# Patient Record
Sex: Female | Born: 1990 | Hispanic: No | Marital: Single | Smoking: Former smoker
Health system: Southern US, Community
[De-identification: ages and names within clinical notes are randomized; demographics above are authoritative.]

## PROBLEM LIST (undated history)

## (undated) ENCOUNTER — Inpatient Hospital Stay (HOSPITAL_COMMUNITY): Payer: Self-pay

## (undated) DIAGNOSIS — F53 Postpartum depression: Secondary | ICD-10-CM

## (undated) DIAGNOSIS — Z789 Other specified health status: Secondary | ICD-10-CM

## (undated) HISTORY — DX: Other specified health status: Z78.9

---

## 2016-08-15 HISTORY — DX: Maternal care for unspecified type scar from previous cesarean delivery: O34.219

## 2017-01-24 ENCOUNTER — Encounter (HOSPITAL_COMMUNITY): Payer: Self-pay

## 2017-01-24 ENCOUNTER — Emergency Department (HOSPITAL_COMMUNITY)
Admission: EM | Admit: 2017-01-24 | Discharge: 2017-01-24 | Disposition: A | Discharge status: Home / Self Care | Payer: Self-pay | Attending: Emergency Medicine | Admitting: Emergency Medicine

## 2017-01-24 DIAGNOSIS — M5431 Sciatica, right side: Secondary | ICD-10-CM | POA: Insufficient documentation

## 2017-01-24 DIAGNOSIS — Y990 Civilian activity done for income or pay: Secondary | ICD-10-CM | POA: Insufficient documentation

## 2017-01-24 DIAGNOSIS — X500XXA Overexertion from strenuous movement or load, initial encounter: Secondary | ICD-10-CM | POA: Insufficient documentation

## 2017-01-24 DIAGNOSIS — Y929 Unspecified place or not applicable: Secondary | ICD-10-CM | POA: Insufficient documentation

## 2017-01-24 DIAGNOSIS — Y9389 Activity, other specified: Secondary | ICD-10-CM | POA: Insufficient documentation

## 2017-01-24 LAB — URINALYSIS, ROUTINE W REFLEX MICROSCOPIC
Bilirubin Urine: NEGATIVE
Glucose, UA: NEGATIVE mg/dL
Hgb urine dipstick: NEGATIVE
Ketones, ur: NEGATIVE mg/dL
LEUKOCYTES UA: NEGATIVE
NITRITE: NEGATIVE
PH: 7 (ref 5.0–8.0)
Protein, ur: NEGATIVE mg/dL
SPECIFIC GRAVITY, URINE: 1.016 (ref 1.005–1.030)

## 2017-01-24 LAB — PREGNANCY, URINE: PREG TEST UR: NEGATIVE

## 2017-01-24 MED ORDER — PREDNISONE 20 MG PO TABS: ORAL_TABLET | ORAL | 0 refills | Status: DC

## 2017-01-24 MED ORDER — METHOCARBAMOL 500 MG PO TABS: 1000.0000 mg | ORAL_TABLET | Freq: Three times a day (TID) | ORAL | 0 refills | Status: DC

## 2017-01-24 NOTE — ED Notes (Signed)
Pt ambulated to room from waiting area, pt refused wheelchair. Pt ambulated with a steady gait and had no complaints while ambulating.

## 2017-01-24 NOTE — ED Provider Notes (Signed)
[facility]PT Provider Note   CSN: [id number] Arrival date & time: 01/24/17  4098     History   Chief Complaint Chief Complaint  Patient presents with  . Flank Pain    HPI Erin Benjamin is a 26 y.o. female.  HPI Pain started 3 days ago. It started at work. Patient does repetitive lifting and bending. Course pain is in her very lower back slightly to the right. It is worse with bending motions. It radiates to her buttock. It becomes severe with certain positions. No associated pain or burning with urination. No vaginal discharge. No abdominal pain. No weakness or numbness in the leg. She has been trying ibuprofen without much relief. History reviewed. No pertinent past medical history.  There are no active problems to display for this patient.   Past Surgical History:  Procedure Laterality Date  . CESAREAN SECTION  08/2014    OB History    No data available       Home Medications    Prior to Admission medications   Medication Sig Start Date End Date Taking? Authorizing Provider  methocarbamol (ROBAXIN) 500 MG tablet Take 2 tablets (1,000 mg total) by mouth 3 (three) times daily. 01/24/17   Arby Barrette, MD  predniSONE (DELTASONE) 20 MG tablet 3 tabs po daily x 3 days, then 2 tabs x 3 days, then 1.5 tabs x 3 days, then 1 tab x 3 days, then 0.5 tabs x 3 days 01/24/17   Arby Barrette, MD    Family History No family history on file.  Social History Social History  Substance Use Topics  . Smoking status: Current Every Day Smoker    Packs/day: 1.00    Types: Cigarettes  . Smokeless tobacco: Never Used  . Alcohol use Yes     Allergies   Patient has no known allergies.   Review of Systems Review of Systems 10 Systems reviewed and are negative for acute change except as noted in the HPI.  Physical Exam Updated Vital Signs BP 118/83 (BP Location: Right Arm)   Pulse 87   Temp 98 F (36.7 C) (Oral)   Resp 16   Ht 5\' 8"  (1.727 m)   Wt 86.2 kg (190  lb)   LMP 12/28/2016   SpO2 100%   BMI 28.89 kg/m   Physical Exam  Constitutional: She is oriented to person, place, and time. She appears well-developed and well-nourished. No distress.  HENT:  Head: Normocephalic and atraumatic.  Eyes: Conjunctivae are normal.  Neck: Neck supple.  Cardiovascular: Normal rate, regular rhythm, normal heart sounds and intact distal pulses.   No murmur heard. Pulmonary/Chest: Effort normal and breath sounds normal. No respiratory distress.  Abdominal: Soft. Bowel sounds are normal. She exhibits no distension. There is no tenderness. There is no guarding.  Musculoskeletal: She exhibits no edema.  Positive straight leg raise 50 on the right. Severe pain to the sciatic region on the right buttock. Minimally reproducible pain to palpation over the SI joint on the right. Pain is produced by patient transitioning from seated position to standing position. Patient can ambulate with steady gait and no weakness.  Neurological: She is alert and oriented to person, place, and time. No cranial nerve deficit. She exhibits normal muscle tone. Coordination normal.  Skin: Skin is warm and dry.  Psychiatric: She has a normal mood and affect.  Nursing note and vitals reviewed.    ED Treatments / Results  Labs (all labs ordered are listed, but only abnormal results  are displayed) Labs Reviewed  URINALYSIS, ROUTINE W REFLEX MICROSCOPIC  PREGNANCY, URINE    EKG  EKG Interpretation None       Radiology No results found.  Procedures Procedures (including critical care time)  Medications Ordered in ED Medications - No data to display   Initial Impression / Assessment and Plan / ED Course  I have reviewed the triage vital signs and the nursing notes.  Pertinent labs & imaging results that were available during my care of the patient were reviewed by me and considered in my medical decision making (see chart for details).      Final Clinical  Impressions(s) / ED Diagnoses   Final diagnoses:  Sciatica of right side   Patient has pain that localizes in her lower back and around the SI joint. Not significantly reproducible to palpation over the muscular bodies or the bony prominences. It is very positionally reproduced with straight leg raise and forward flexion. Patient does not have associated weakness or numbness. She does repetitive work with stooping and twisting. Plan will be to initiate a 2 week course of prednisone with muscle relaxers. Temporary work restrictions. Patient is consulted on the necessity of follow-up for ongoing management. New Prescriptions New Prescriptions   METHOCARBAMOL (ROBAXIN) 500 MG TABLET    Take 2 tablets (1,000 mg total) by mouth 3 (three) times daily.   PREDNISONE (DELTASONE) 20 MG TABLET    3 tabs po daily x 3 days, then 2 tabs x 3 days, then 1.5 tabs x 3 days, then 1 tab x 3 days, then 0.5 tabs x 3 days     Arby BarrettePfeiffer, Beola Vasallo, MD 01/24/17 1002

## 2017-01-24 NOTE — ED Triage Notes (Signed)
Per Pt, Pt reports lower back pain that started three days ago that now is trailing around hips bilaterally. Pt reports urinary frequency. Denies any N/V or fevers.

## 2017-02-13 ENCOUNTER — Encounter (HOSPITAL_COMMUNITY): Payer: Self-pay | Admitting: Emergency Medicine

## 2017-02-13 ENCOUNTER — Emergency Department (HOSPITAL_COMMUNITY)
Admission: EM | Admit: 2017-02-13 | Discharge: 2017-02-14 | Disposition: A | Discharge status: Home / Self Care | Payer: Self-pay | Attending: Emergency Medicine | Admitting: Emergency Medicine

## 2017-02-13 DIAGNOSIS — M791 Myalgia: Secondary | ICD-10-CM | POA: Insufficient documentation

## 2017-02-13 DIAGNOSIS — Z5321 Procedure and treatment not carried out due to patient leaving prior to being seen by health care provider: Secondary | ICD-10-CM | POA: Insufficient documentation

## 2017-02-13 DIAGNOSIS — R110 Nausea: Secondary | ICD-10-CM | POA: Insufficient documentation

## 2017-02-13 MED ORDER — ACETAMINOPHEN 325 MG PO TABS
650.0000 mg | ORAL_TABLET | Freq: Once | ORAL | Status: AC | PRN
  Administered 2017-02-13: 650 mg via ORAL
  Filled 2017-02-13: qty 2

## 2017-02-13 NOTE — ED Notes (Signed)
Pt called to be taken to treatment room with no answer  

## 2017-02-13 NOTE — ED Triage Notes (Addendum)
Pt from home with c/o fever and generalized aches x 4 days. Pt states she has been dry heaving today. Per EMS, pt has a fever of 103.3. Pt states she took advil for this.

## 2017-02-15 ENCOUNTER — Emergency Department (HOSPITAL_COMMUNITY)
Admission: EM | Admit: 2017-02-15 | Discharge: 2017-02-15 | Disposition: A | Discharge status: Home / Self Care | Payer: Self-pay | Attending: Emergency Medicine | Admitting: Emergency Medicine

## 2017-02-15 ENCOUNTER — Encounter (HOSPITAL_COMMUNITY): Payer: Self-pay

## 2017-02-15 DIAGNOSIS — N12 Tubulo-interstitial nephritis, not specified as acute or chronic: Secondary | ICD-10-CM | POA: Insufficient documentation

## 2017-02-15 DIAGNOSIS — E876 Hypokalemia: Secondary | ICD-10-CM | POA: Insufficient documentation

## 2017-02-15 DIAGNOSIS — F17210 Nicotine dependence, cigarettes, uncomplicated: Secondary | ICD-10-CM | POA: Insufficient documentation

## 2017-02-15 DIAGNOSIS — N10 Acute tubulo-interstitial nephritis: Secondary | ICD-10-CM

## 2017-02-15 LAB — CBC WITH DIFFERENTIAL/PLATELET
Basophils Absolute: 0 10*3/uL (ref 0.0–0.1)
Basophils Relative: 0 %
EOS PCT: 0 %
Eosinophils Absolute: 0 10*3/uL (ref 0.0–0.7)
HCT: 39.1 % (ref 36.0–46.0)
Hemoglobin: 12.8 g/dL (ref 12.0–15.0)
LYMPHS ABS: 1.2 10*3/uL (ref 0.7–4.0)
LYMPHS PCT: 8 %
MCH: 28.3 pg (ref 26.0–34.0)
MCHC: 32.7 g/dL (ref 30.0–36.0)
MCV: 86.3 fL (ref 78.0–100.0)
MONO ABS: 1.6 10*3/uL — AB (ref 0.1–1.0)
Monocytes Relative: 11 %
Neutro Abs: 11.4 10*3/uL — ABNORMAL HIGH (ref 1.7–7.7)
Neutrophils Relative %: 81 %
PLATELETS: 173 10*3/uL (ref 150–400)
RBC: 4.53 MIL/uL (ref 3.87–5.11)
RDW: 15.8 % — AB (ref 11.5–15.5)
WBC: 14.1 10*3/uL — ABNORMAL HIGH (ref 4.0–10.5)

## 2017-02-15 LAB — URINALYSIS, ROUTINE W REFLEX MICROSCOPIC
Bilirubin Urine: NEGATIVE
Glucose, UA: NEGATIVE mg/dL
KETONES UR: 20 mg/dL — AB
NITRITE: NEGATIVE
PROTEIN: 30 mg/dL — AB
Specific Gravity, Urine: 1.004 — ABNORMAL LOW (ref 1.005–1.030)
Squamous Epithelial / LPF: NONE SEEN
pH: 6 (ref 5.0–8.0)

## 2017-02-15 LAB — I-STAT BETA HCG BLOOD, ED (MC, WL, AP ONLY): I-stat hCG, quantitative: <5 m[IU]/mL (ref <5)

## 2017-02-15 LAB — I-STAT CHEM 8, ED
BUN: 5 mg/dL — ABNORMAL LOW (ref 6–20)
CALCIUM ION: 1.13 mmol/L — AB (ref 1.15–1.40)
CHLORIDE: 97 mmol/L — AB (ref 101–111)
Creatinine, Ser: 0.7 mg/dL (ref 0.44–1.00)
Glucose, Bld: 134 mg/dL — ABNORMAL HIGH (ref 65–99)
HCT: 49 % — ABNORMAL HIGH (ref 36.0–46.0)
HEMOGLOBIN: 16.7 g/dL — AB (ref 12.0–15.0)
Potassium: 2.9 mmol/L — ABNORMAL LOW (ref 3.5–5.1)
SODIUM: 135 mmol/L (ref 135–145)
TCO2: 23 mmol/L (ref 0–100)

## 2017-02-15 LAB — I-STAT CG4 LACTIC ACID, ED: Lactic Acid, Venous: 0.74 mmol/L (ref 0.5–1.9)

## 2017-02-15 MED ORDER — SODIUM CHLORIDE 0.9 % IV BOLUS (SEPSIS)
1000.0000 mL | Freq: Once | INTRAVENOUS | Status: AC
  Administered 2017-02-15: 1000 mL via INTRAVENOUS

## 2017-02-15 MED ORDER — METOCLOPRAMIDE HCL 5 MG/ML IJ SOLN
10.0000 mg | Freq: Once | INTRAMUSCULAR | Status: AC
  Administered 2017-02-15: 10 mg via INTRAVENOUS
  Filled 2017-02-15: qty 2

## 2017-02-15 MED ORDER — POTASSIUM CHLORIDE CRYS ER 20 MEQ PO TBCR
40.0000 meq | EXTENDED_RELEASE_TABLET | Freq: Once | ORAL | Status: AC
  Administered 2017-02-15: 40 meq via ORAL
  Filled 2017-02-15: qty 2

## 2017-02-15 MED ORDER — METOCLOPRAMIDE HCL 10 MG PO TABS: 10.0000 mg | ORAL_TABLET | Freq: Four times a day (QID) | ORAL | 0 refills | Status: DC | PRN

## 2017-02-15 MED ORDER — DEXTROSE 5 % IV SOLN
1.0000 g | Freq: Once | INTRAVENOUS | Status: AC
  Administered 2017-02-15: 1 g via INTRAVENOUS
  Filled 2017-02-15: qty 10

## 2017-02-15 MED ORDER — ACETAMINOPHEN 500 MG PO TABS
1000.0000 mg | ORAL_TABLET | Freq: Once | ORAL | Status: AC
  Administered 2017-02-15: 1000 mg via ORAL
  Filled 2017-02-15: qty 2

## 2017-02-15 MED ORDER — CEPHALEXIN 500 MG PO CAPS: 500.0000 mg | ORAL_CAPSULE | Freq: Four times a day (QID) | ORAL | 0 refills | Status: DC

## 2017-02-15 MED ORDER — ACETAMINOPHEN 325 MG PO TABS: 325.0000 mg | ORAL_TABLET | Freq: Once | ORAL | Status: DC

## 2017-02-15 NOTE — ED Provider Notes (Signed)
[facility] Provider Note   CSN: [id number] Arrival date & time: 02/15/17  0753     History   Chief Complaint Chief Complaint  Patient presents with  . Headache  . Emesis    HPI Erin Benjamin is a 26 y.o. female.  HPI complains of throbbing headache at the top of head and occiput gradual onset 3 days ago. She is treated himself with Advil, without relief. She reports temperature 103 degrees [facility] 2 days ago 3 days ago, when she complained of right flank pain. She missed of vomiting 2 times this morning she gets similar headaches approximately twice per month however not lasting as long. She denies any neck pain or stiffness. She was prescribed prednisone and Robaxin which she's taken without relief. She also treated herself with Advil last dose at midnight today. No other associated symptoms  History reviewed. No pertinent past medical history.  There are no active problems to display for this patient.   Past Surgical History:  Procedure Laterality Date  . CESAREAN SECTION  08/2014    OB History    Gravida Para Term Preterm AB Living   1 1           SAB TAB Ectopic Multiple Live Births                   Home Medications    Prior to Admission medications   Medication Sig Start Date End Date Taking? Authorizing Provider  methocarbamol (ROBAXIN) 500 MG tablet Take 2 tablets (1,000 mg total) by mouth 3 (three) times daily. 01/24/17   Arby BarrettePfeiffer, Marcy, MD  predniSONE (DELTASONE) 20 MG tablet 3 tabs po daily x 3 days, then 2 tabs x 3 days, then 1.5 tabs x 3 days, then 1 tab x 3 days, then 0.5 tabs x 3 days 01/24/17   Arby BarrettePfeiffer, Marcy, MD    Family History No family history on file.  Social History Social History  Substance Use Topics  . Smoking status: Current Every Day Smoker    Packs/day: 1.00    Types: Cigarettes  . Smokeless tobacco: Never Used  . Alcohol use Yes     Allergies   Patient has no known allergies.   Review of  Systems Review of Systems  Constitutional: Positive for fever.  HENT: Negative.   Respiratory: Negative.   Cardiovascular: Negative.   Gastrointestinal: Positive for nausea and vomiting.  Genitourinary: Positive for flank pain.  Skin: Negative.   Neurological: Negative.   Psychiatric/Behavioral: Negative.   All other systems reviewed and are negative.    Physical Exam Updated Vital Signs BP 121/83 (BP Location: Right Arm)   Pulse (!) 111   Temp 99.1 F (37.3 C) (Oral)   Resp 18   Ht 5\' 8"  (1.727 m)   Wt 85.3 kg (188 lb)   LMP 01/28/2017   SpO2 98%   BMI 28.59 kg/m   Physical Exam  Constitutional: She is oriented to person, place, and time. She appears well-developed and well-nourished. No distress.  HENT:  Head: Normocephalic and atraumatic.  Eyes: Conjunctivae are normal. Pupils are equal, round, and reactive to light.  Fundi benign  Neck: Neck supple. No tracheal deviation present. No thyromegaly present.  Cardiovascular: Normal rate, regular rhythm and normal heart sounds.   No murmur heard. Mildly tachycardic  Pulmonary/Chest: Effort normal and breath sounds normal.  Abdominal: Soft. Bowel sounds are normal. She exhibits no distension. There is no tenderness.  Genitourinary:  Genitourinary  Comments: Right flank tenderness  Musculoskeletal: Normal range of motion. She exhibits no edema or tenderness.  Neurological: She is alert and oriented to person, place, and time. Coordination normal.  Gait normal Romberg normal pronator drift normal finger to nose normal DTR symmetric bilaterally at knee jerk ankle jerk and biceps as ordered bilaterally  Skin: Skin is warm and dry. Capillary refill takes less than 2 seconds. No rash noted.  Psychiatric: She has a normal mood and affect.  Nursing note and vitals reviewed.    ED Treatments / Results  Labs (all labs ordered are listed, but only abnormal results are displayed) Labs Reviewed  COMPREHENSIVE METABOLIC PANEL    CBC WITH DIFFERENTIAL/PLATELET  URINALYSIS, ROUTINE W REFLEX MICROSCOPIC  I-STAT CG4 LACTIC ACID, ED  I-STAT BETA HCG BLOOD, ED ([facility], [facility], [facility] ONLY)    EKG  EKG Interpretation None      Results for orders placed or performed during the hospital encounter of 02/15/17  CBC with Differential  Result Value Ref Range   WBC 14.1 (H) 4.0 - 10.5 K/uL   RBC 4.53 3.87 - 5.11 MIL/uL   Hemoglobin 12.8 12.0 - 15.0 g/dL   HCT 16.1 09.6 - 04.5 %   MCV 86.3 78.0 - 100.0 fL   MCH 28.3 26.0 - 34.0 pg   MCHC 32.7 30.0 - 36.0 g/dL   RDW 40.9 (H) 81.1 - 91.4 %   Platelets 173 150 - 400 K/uL   Neutrophils Relative % 81 %   Neutro Abs 11.4 (H) 1.7 - 7.7 K/uL   Lymphocytes Relative 8 %   Lymphs Abs 1.2 0.7 - 4.0 K/uL   Monocytes Relative 11 %   Monocytes Absolute 1.6 (H) 0.1 - 1.0 K/uL   Eosinophils Relative 0 %   Eosinophils Absolute 0.0 0.0 - 0.7 K/uL   Basophils Relative 0 %   Basophils Absolute 0.0 0.0 - 0.1 K/uL  Urinalysis, Routine w reflex microscopic  Result Value Ref Range   Color, Urine YELLOW YELLOW   APPearance CLOUDY (A) CLEAR   Specific Gravity, Urine 1.004 (L) 1.005 - 1.030   pH 6.0 5.0 - 8.0   Glucose, UA NEGATIVE NEGATIVE mg/dL   Hgb urine dipstick MODERATE (A) NEGATIVE   Bilirubin Urine NEGATIVE NEGATIVE   Ketones, ur 20 (A) NEGATIVE mg/dL   Protein, ur 30 (A) NEGATIVE mg/dL   Nitrite NEGATIVE NEGATIVE   Leukocytes, UA LARGE (A) NEGATIVE   RBC / HPF 0-5 0 - 5 RBC/hpf   WBC, UA TOO NUMEROUS TO COUNT 0 - 5 WBC/hpf   Bacteria, UA MANY (A) NONE SEEN   Squamous Epithelial / LPF NONE SEEN NONE SEEN   WBC Clumps PRESENT   I-Stat CG4 Lactic Acid, ED  Result Value Ref Range   Lactic Acid, Venous 0.74 0.5 - 1.9 mmol/L  I-Stat beta hCG blood, ED  Result Value Ref Range   I-stat hCG, quantitative <5.0 <5 mIU/mL   Comment 3          I-stat chem 8, ed  Result Value Ref Range   Sodium 135 135 - 145 mmol/L   Potassium 2.9 (L) 3.5 - 5.1 mmol/L   Chloride 97 (L) 101 - 111 mmol/L    BUN 5 (L) 6 - 20 mg/dL   Creatinine, Ser 7.82 0.44 - 1.00 mg/dL   Glucose, Bld 956 (H) 65 - 99 mg/dL   Calcium, Ion 2.13 (L) 1.15 - 1.40 mmol/L   TCO2 23 0 - 100 mmol/L   Hemoglobin  16.7 (H) 12.0 - 15.0 g/dL   HCT 16.1 (H) 09.6 - 04.5 %   No results found. Radiology No results found.  Procedures Procedures (including critical care time)  Medications Ordered in ED Medications - No data to display   Initial Impression / Assessment and Plan / ED Course  I have reviewed the triage vital signs and the nursing notes.  Pertinent labs & imaging results that were available during my care of the patient were reviewed by me and considered in my medical decision making (see chart for details).     11:50 AM patient feels much improved after treatment with intravenous fluids, oral potassium, IV antibiotics, IV Reglan and Tylenol. She is no longer nauseated. She is able to drink without vomiting. She feels ready to go home Prescription Keflex., Reglan Referral primary care. Urine sent for culture. Final Clinical Impressions(s) / ED Diagnoses  Diagnosis #1 acute pyelonephritis #2 nausea and vomiting #3 headache #4 hypokalemia Final diagnoses:  None    New Prescriptions New Prescriptions   No medications on file     Doug Sou, MD 02/15/17 1200

## 2017-02-15 NOTE — ED Triage Notes (Signed)
Pt arrives POV with c/o headache and chill since Monday . [facility] for same. Alert and oriented x 4 MAEW.

## 2017-02-15 NOTE — ED Notes (Signed)
Dr. Ethelda ChickJacubowitz states to discontinue lab and chest xray.

## 2017-02-15 NOTE — Discharge Instructions (Signed)
Make sure that you finish the antibiotic (Keflex or cephalexin) as prescribed. Take the medication prescribed as needed for nausea or headache. Take Tylenol every 4 hours for aches or for temperature higher than 100.4 while awake. Return if you continue to vomit Or do not feel improved after 2 or 3 days or you can return sooner if concern for any reason. You can call any of the numbers on these instructions to get a primary care physician

## 2017-02-15 NOTE — ED Notes (Signed)
ED Provider at bedside. 

## 2017-02-17 LAB — URINE CULTURE
Culture: 50000 — AB
Special Requests: NORMAL

## 2017-02-18 ENCOUNTER — Telehealth: Payer: Self-pay

## 2017-02-18 NOTE — Telephone Encounter (Signed)
Post ED Visit - Positive Culture Follow-up  Culture report reviewed by antimicrobial stewardship pharmacist:  []  Enzo BiNathan Batchelder, Pharm.D. []  Celedonio MiyamotoJeremy Frens, Pharm.D., BCPS AQ-ID []  Garvin FilaMike Maccia, Pharm.D., BCPS []  Georgina PillionElizabeth Martin, Pharm.D., BCPS []  WaldoMinh Pham, VermontPharm.D., BCPS, AAHIVP []  Estella HuskMichelle Turner, Pharm.D., BCPS, AAHIVP []  Lysle Pearlachel Rumbarger, PharmD, BCPS []  Casilda Carlsaylor Stone, PharmD, BCPS []  Pollyann SamplesAndy Johnston, PharmD, BCPS [place] HunAllison Masters Pharm D Positive urine culture Treated with Cephalexin, organism sensitive to the same and no further patient follow-up is required at this time.  Jerry CarasCullom, Brylin Stanislawski Burnett 02/18/2017, 9:43 AM

## 2017-03-27 ENCOUNTER — Inpatient Hospital Stay (HOSPITAL_COMMUNITY)
Admission: AD | Admit: 2017-03-27 | Discharge: 2017-03-27 | Disposition: A | Discharge status: Home / Self Care | Payer: Medicaid Other | Source: Ambulatory Visit | Attending: Obstetrics and Gynecology | Admitting: Obstetrics and Gynecology

## 2017-03-27 ENCOUNTER — Inpatient Hospital Stay (HOSPITAL_COMMUNITY): Payer: Medicaid Other

## 2017-03-27 ENCOUNTER — Encounter (HOSPITAL_COMMUNITY): Payer: Self-pay | Admitting: *Deleted

## 2017-03-27 DIAGNOSIS — O209 Hemorrhage in early pregnancy, unspecified: Secondary | ICD-10-CM | POA: Insufficient documentation

## 2017-03-27 DIAGNOSIS — F17210 Nicotine dependence, cigarettes, uncomplicated: Secondary | ICD-10-CM | POA: Diagnosis not present

## 2017-03-27 DIAGNOSIS — Z3A01 Less than 8 weeks gestation of pregnancy: Secondary | ICD-10-CM | POA: Diagnosis not present

## 2017-03-27 DIAGNOSIS — N939 Abnormal uterine and vaginal bleeding, unspecified: Secondary | ICD-10-CM | POA: Diagnosis present

## 2017-03-27 DIAGNOSIS — R102 Pelvic and perineal pain: Secondary | ICD-10-CM | POA: Insufficient documentation

## 2017-03-27 DIAGNOSIS — O26891 Other specified pregnancy related conditions, first trimester: Secondary | ICD-10-CM | POA: Diagnosis present

## 2017-03-27 DIAGNOSIS — O99331 Smoking (tobacco) complicating pregnancy, first trimester: Secondary | ICD-10-CM | POA: Diagnosis not present

## 2017-03-27 LAB — CBC
HCT: 37.9 % (ref 36.0–46.0)
HEMOGLOBIN: 12.5 g/dL (ref 12.0–15.0)
MCH: 29.2 pg (ref 26.0–34.0)
MCHC: 33 g/dL (ref 30.0–36.0)
MCV: 88.6 fL (ref 78.0–100.0)
Platelets: 291 10*3/uL (ref 150–400)
RBC: 4.28 MIL/uL (ref 3.87–5.11)
RDW: 16.4 % — AB (ref 11.5–15.5)
WBC: 17.1 10*3/uL — ABNORMAL HIGH (ref 4.0–10.5)

## 2017-03-27 LAB — POCT PREGNANCY, URINE: PREG TEST UR: POSITIVE — AB

## 2017-03-27 LAB — URINALYSIS, ROUTINE W REFLEX MICROSCOPIC
BILIRUBIN URINE: NEGATIVE
GLUCOSE, UA: NEGATIVE mg/dL
HGB URINE DIPSTICK: NEGATIVE
KETONES UR: NEGATIVE mg/dL
Leukocytes, UA: NEGATIVE
Nitrite: NEGATIVE
PROTEIN: NEGATIVE mg/dL
Specific Gravity, Urine: 1.018 (ref 1.005–1.030)
pH: 6 (ref 5.0–8.0)

## 2017-03-27 LAB — HCG, QUANTITATIVE, PREGNANCY: HCG, BETA CHAIN, QUANT, S: 73385 m[IU]/mL — AB (ref <5)

## 2017-03-27 LAB — ABO/RH: ABO/RH(D): O POS

## 2017-03-27 NOTE — MAU Note (Signed)
Pt started having lower abd  & back pain @ 0400 this morning, also started bleeding today.  Pos HPT on Friday.  C/O vomiting, no diarrhea or fever.

## 2017-03-27 NOTE — MAU Provider Note (Signed)
History     CSN: [id number]  Arrival date and time: 03/27/17 1638   None     Chief Complaint  Patient presents with  . Vaginal Bleeding   Erin Benjamin is a 26 y.o. G1P1 at Unknown who presents today with vaginal bleeding. She had a +UPT on 03/24/17.   Vaginal Bleeding  The patient's primary symptoms include pelvic pain and vaginal bleeding. The patient's pertinent negatives include no vaginal discharge. This is a new problem. The current episode started today. The problem occurs constantly. The problem has been unchanged. Pain severity now: 10/10. The problem affects both sides. She is pregnant. Associated symptoms include back pain. Pertinent negatives include no chills, dysuria, fever, frequency, nausea, urgency or vomiting. The vaginal bleeding is lighter than menses. She has not been passing clots. She has not been passing tissue. Nothing aggravates the symptoms. She has tried nothing for the symptoms. Her menstrual history has been regular (LMP 01/28/17 ).   No past medical history on file.  Past Surgical History:  Procedure Laterality Date  . CESAREAN SECTION  08/2014    No family history on file.  Social History  Substance Use Topics  . Smoking status: Current Every Day Smoker    Packs/day: 1.00    Types: Cigarettes  . Smokeless tobacco: Never Used  . Alcohol use Yes    Allergies: No Known Allergies  Prescriptions Prior to Admission  Medication Sig Dispense Refill Last Dose  . cephALEXin (KEFLEX) 500 MG capsule Take 1 capsule (500 mg total) by mouth 4 (four) times daily. 1 capsule 3 times daily for 10 days 30 capsule 0   . methocarbamol (ROBAXIN) 500 MG tablet Take 2 tablets (1,000 mg total) by mouth 3 (three) times daily. 30 tablet 0 2-3 weeks  . metoCLOPramide (REGLAN) 10 MG tablet Take 1 tablet (10 mg total) by mouth every 6 (six) hours as needed for nausea (nausea/headache). 10 tablet 0   . predniSONE (DELTASONE) 20 MG tablet 3 tabs po daily x 3 days, then 2  tabs x 3 days, then 1.5 tabs x 3 days, then 1 tab x 3 days, then 0.5 tabs x 3 days (Patient not taking: Reported on 02/15/2017) 27 tablet 0 Completed Course at Unknown time    Review of Systems  Constitutional: Negative for chills and fever.  Gastrointestinal: Negative for nausea and vomiting.  Genitourinary: Positive for pelvic pain and vaginal bleeding. Negative for dysuria, frequency, urgency and vaginal discharge.  Musculoskeletal: Positive for back pain.   Physical Exam   Blood pressure 116/84, pulse 75, temperature 98.5 F (36.9 C), temperature source Oral, resp. rate 18, height 5\' 8"  (1.727 m), weight 192 lb (87.1 kg), last menstrual period 01/28/2017.  Physical Exam  Nursing note and vitals reviewed. Constitutional: She is oriented to person, place, and time. She appears well-developed and well-nourished. No distress.  HENT:  Head: Normocephalic.  Cardiovascular: Normal rate.   Respiratory: Effort normal.  GI: Soft. There is no tenderness. There is no rebound.  Neurological: She is alert and oriented to person, place, and time.  Skin: Skin is warm and dry.  Psychiatric: She has a normal mood and affect.    MAU Course  Procedures  MDM 2000: Care turned over to J. Wenzel at 2000.  CBC/HCG/ABO/RH/US pending Thressa ShellerHeather Hogan 7:43 PM 03/27/17   2000 [facility]. Patient waiting for US.  Patient declines pelvic exam at this time and requests to leave because she has to work early  in the morning. She denies any additional bleeding noted since arrival in MAU  Assessment and Plan  A: [facility]UP at [redacted]w[redacted]d Spotting in pregnancy, first trimester  P:  Discharge home Tylenol PRN for pain Bleeding/first trimester precautions discussed Patient advised to follow-up with OB provider of choice Pregnancy confirmation letter and list of area OB/GYN providers given Patient may return to MAU as needed or if her condition were to change or worsen   Marny Lowenstein,  PA-C 03/27/2017 1:44 AM

## 2017-03-27 NOTE — Discharge Instructions (Signed)
Vaginal Bleeding During Pregnancy, First Trimester °A small amount of bleeding (spotting) from the vagina is common in early pregnancy. Sometimes the bleeding is normal and is not a problem, and sometimes it is a sign of something serious. Be sure to tell your doctor about any bleeding from your vagina right away. °Follow these instructions at home: °· Watch your condition for any changes. °· Follow your doctor's instructions about how active you can be. °· If you are on bed rest: °? You may need to stay in bed and only get up to use the bathroom. °? You may be allowed to do some activities. °? If you need help, make plans for someone to help you. °· Write down: °? The number of pads you use each day. °? How often you change pads. °? How soaked (saturated) your pads are. °· Do not use tampons. °· Do not douche. °· Do not have sex or orgasms until your doctor says it is okay. °· If you pass any tissue from your vagina, save the tissue so you can show it to your doctor. °· Only take medicines as told by your doctor. °· Do not take aspirin because it can make you bleed. °· Keep all follow-up visits as told by your doctor. °Contact a doctor if: °· You bleed from your vagina. °· You have cramps. °· You have labor pains. °· You have a fever that does not go away after you take medicine. °Get help right away if: °· You have very bad cramps in your back or belly (abdomen). °· You pass large clots or tissue from your vagina. °· You bleed more. °· You feel light-headed or weak. °· You pass out (faint). °· You have chills. °· You are leaking fluid or have a gush of fluid from your vagina. °· You pass out while pooping (having a bowel movement). °This information is not intended to replace advice given to you by your health care provider. Make sure you discuss any questions you have with your health care provider. °Document Released: 12/16/2013 Document Revised: 01/07/2016 Document Reviewed: 04/08/2013 °Elsevier Interactive  Patient Education © 2018 [employer] Inc. ° °Pelvic Rest °Pelvic rest may be recommended if: °· Your placenta is partially or completely covering the opening of your cervix (placenta previa). °· There is bleeding between the wall of the uterus and the amniotic sac in the first trimester of pregnancy (subchorionic hemorrhage). °· You went into labor too early (preterm labor). ° °Based on your overall health and the health of your baby, your health care provider will decide if pelvic rest is right for you. °How do I rest my pelvis? °For as long as told by your health care provider: °· Do not have sex, sexual stimulation, or an orgasm. °· Do not use tampons. Do not douche. Do not put anything in your vagina. °· Do not lift anything that is heavier than 10 lb (4.5 kg). °· Avoid activities that take a lot of effort (are strenuous). °· Avoid any activity in which your pelvic muscles could become strained. ° °When should I seek medical care? °Seek medical care if you have: °· Cramping pain in your lower abdomen. °· Vaginal discharge. °· A low, dull backache. °· Regular contractions. °· Uterine tightening. ° °When should I seek immediate medical care? °Seek immediate medical care if: °· You have vaginal bleeding and you are pregnant. ° °This information is not intended to replace advice given to you by your health care provider. Make sure   you discuss any questions you have with your health care provider. °Document Released: 11/26/2010 Document Revised: 01/07/2016 Document Reviewed: 02/02/2015 °Elsevier Interactive Patient Education © 2018 Elsevier Inc. ° °. ° °

## 2017-03-29 LAB — CULTURE, OB URINE

## 2017-05-17 ENCOUNTER — Encounter: Payer: Self-pay | Admitting: Family Medicine

## 2017-05-18 ENCOUNTER — Ambulatory Visit (INDEPENDENT_AMBULATORY_CARE_PROVIDER_SITE_OTHER): Payer: Medicaid Other | Admitting: Obstetrics & Gynecology

## 2017-05-18 ENCOUNTER — Encounter: Payer: Self-pay | Admitting: Obstetrics & Gynecology

## 2017-05-18 ENCOUNTER — Ambulatory Visit: Payer: Self-pay

## 2017-05-18 ENCOUNTER — Other Ambulatory Visit (HOSPITAL_COMMUNITY)
Admission: RE | Admit: 2017-05-18 | Discharge: 2017-05-18 | Disposition: A | Discharge status: Home / Self Care | Payer: Medicaid Other | Source: Ambulatory Visit | Attending: Obstetrics & Gynecology | Admitting: Obstetrics & Gynecology

## 2017-05-18 VITALS — BP 142/79 | HR 89 | Ht 67.0 in | Wt 186.8 lb

## 2017-05-18 DIAGNOSIS — Z124 Encounter for screening for malignant neoplasm of cervix: Secondary | ICD-10-CM | POA: Diagnosis not present

## 2017-05-18 DIAGNOSIS — Z3480 Encounter for supervision of other normal pregnancy, unspecified trimester: Secondary | ICD-10-CM | POA: Insufficient documentation

## 2017-05-18 DIAGNOSIS — Z87891 Personal history of nicotine dependence: Secondary | ICD-10-CM

## 2017-05-18 DIAGNOSIS — Z113 Encounter for screening for infections with a predominantly sexual mode of transmission: Secondary | ICD-10-CM

## 2017-05-18 DIAGNOSIS — Z3482 Encounter for supervision of other normal pregnancy, second trimester: Secondary | ICD-10-CM | POA: Diagnosis not present

## 2017-05-18 DIAGNOSIS — O3680X0 Pregnancy with inconclusive fetal viability, not applicable or unspecified: Secondary | ICD-10-CM

## 2017-05-18 DIAGNOSIS — Z1389 Encounter for screening for other disorder: Secondary | ICD-10-CM | POA: Diagnosis not present

## 2017-05-18 DIAGNOSIS — B9689 Other specified bacterial agents as the cause of diseases classified elsewhere: Secondary | ICD-10-CM | POA: Insufficient documentation

## 2017-05-18 NOTE — Progress Notes (Signed)
Here for new ob today. Reports spotting on 05/15/17 and then bleeding like a period 10/2-10/3/18, then spotting again.  Sent to [place] in our department to check for heartrate. C/o green vaginal discharge last week.  Declines flu shot.  Signed up for [employer].   2:27  Addendum panorama sent at patient request. Pick up #[id number]. Medicaid home form completed.

## 2017-05-18 NOTE — Patient Instructions (Signed)
Vaginal Birth After Cesarean Delivery Vaginal birth after cesarean delivery (VBAC) is giving birth vaginally after previously delivering a baby by a cesarean. In the past, if a woman had a cesarean delivery, all births afterward would be done by cesarean delivery. This is no longer true. It can be safe for the mother to try a vaginal delivery after having a cesarean delivery. It is important to discuss VBAC with your health care provider early in the pregnancy so you can understand the risks, benefits, and options. It will give you time to decide what is best in your particular case. The final decision about whether to have a VBAC or repeat cesarean delivery should be between you and your health care provider. Any changes in your health or your baby's health during your pregnancy may make it necessary to change your initial decision about VBAC. Women who plan to have a VBAC should check with their health care provider to be sure that:  The previous cesarean delivery was done with a low transverse uterine cut (incision) (not a vertical classical incision).  The birth canal is big enough for the baby.  There were no other operations on the uterus.  An electronic fetal monitor (EFM) will be on at all times during labor.  An operating room will be available and ready in case an emergency cesarean delivery is needed.  A health care provider and surgical nursing staff will be available at all times during labor to be ready to do an emergency delivery cesarean if necessary.  An anesthesiologist will be present in case an emergency cesarean delivery is needed.  The nursery is prepared and has adequate personnel and necessary equipment available to care for the baby in case of an emergency cesarean delivery. Benefits of VBAC  Shorter stay in the hospital.  Avoidance of risks associated with cesarean delivery, such as: ? Surgical complications, such as opening of the incision or hernia in the  incision. ? Injury to other organs. ? Fever. This can occur if an infection develops after surgery. It can also occur as a reaction to the medicine given to make you numb during the surgery.  Less blood loss and need for blood transfusions.  Lower risk of blood clots and infection.  Shorter recovery.  Decreased risk for having to remove the uterus (hysterectomy).  Decreased risk for the placenta to completely or partially cover the opening of the uterus (placenta previa) with a future pregnancy.  Decrease risk in future labor and delivery. Risks of a VBAC  Tearing (rupture) of the uterus. This is occurs in less than 1% of VBACs. The risk of this happening is higher if: ? Steps are taken to begin the labor process (induce labor) or stimulate or strengthen contractions (augment labor). ? Medicine is used to soften (ripen) the cervix.  Having to remove the uterus (hysterectomy) if it ruptures. VBAC should not be done if:  The previous cesarean delivery was done with a vertical (classical) or T-shaped incision or you do not know what kind of incision was made.  You had a ruptured uterus.  You have had certain types of surgery on your uterus, such as removal of uterine fibroids. Ask your health care provider about other types of surgeries that prevent you from having a VBAC.  You have certain medical or childbirth (obstetrical) problems.  There are problems with the baby.  You have had two previous cesarean deliveries and no vaginal deliveries. Other facts to know about VBAC:  It   is safe to have an epidural anesthetic with VBAC.  It is safe to turn the baby from a breech position (attempt an external cephalic version).  It is safe to try a VBAC with twins.  VBAC may not be successful if your baby weights 8.8 lb (4 kg) or more. However, weight predictions are not always accurate and should not be used alone to decide if VBAC is right for you.  There is an increased failure rate  if the time between the cesarean delivery and VBAC is less than 19 months.  Your health care provider may advise against a VBAC if you have preeclampsia (high blood pressure, protein in the urine, and swelling of face and extremities).  VBAC is often successful if you previously gave birth vaginally.  VBAC is often successful when the labor starts spontaneously before the due date.  Delivering a baby through a VBAC is similar to having a normal spontaneous vaginal delivery. This information is not intended to replace advice given to you by your health care provider. Make sure you discuss any questions you have with your health care provider. Document Released: 01/22/2007 Document Revised: 01/07/2016 Document Reviewed: 02/28/2013 Elsevier Interactive Patient Education  2018 [employer] Inc[employer]  

## 2017-05-18 NOTE — Progress Notes (Signed)
Pt informed that the ultrasound is considered a limited OB ultrasound and is not intended to be a complete ultrasound exam.  Patient also informed that the ultrasound is not being completed with the intent of assessing for fetal or placental anomalies or any pelvic abnormalities.  Explained that the purpose of today's ultrasound is to assess for  viability.  Patient acknowledges the purpose of the exam and the limitations of the study.    

## 2017-05-18 NOTE — Progress Notes (Signed)
  Subjective:    Erin Benjamin is a G2P1001 [redacted]w[redacted]d being seen today for her first obstetrical visit.  Her obstetrical history is significant for previous cesarean section. Patient does intend to breast feed. Pregnancy history fully reviewed.  Patient reports no complaints.  Vitals:   05/18/17 0915 05/18/17 0917  BP: (!) 142/79   Pulse: 89   Weight: 186 lb 12.8 oz (84.7 kg)   Height:   (1.702 m)    HISTORY: OB History  Gravida Para Term Preterm AB Living  0 0 1  SAB TAB Ectopic Multiple Live Births          1    # Outcome Date GA Lbr Len/2nd Weight Sex Delivery Anes PTL Lv  2 Current           1 Term 08/18/14 [redacted]w[redacted]d  8 lb 6 oz (3.799 kg) M CS-Unspec EPI  LIV     Birth Comments: no complications except c/s FTP     History reviewed. No pertinent past medical history. Past Surgical History:  Procedure Laterality Date  . CESAREAN SECTION  08/2014   Family History  Problem Relation Age of Onset  . Diabetes Mother      Exam    Uterus:   14 week  Pelvic Exam:    Perineum: No Hemorrhoids   Vulva: normal   Vagina:  normal mucosa   pH:     Cervix: no lesions   Adnexa: no mass, fullness, tenderness   Bony Pelvis: average  System: Breast:  normal appearance, no masses or tenderness   Skin: normal coloration and turgor, no rashes    Neurologic: oriented, normal mood   Extremities: normal strength, tone, and muscle mass   HEENT PERRLA and extra ocular movement intact   Mouth/Teeth mucous membranes moist, pharynx normal without lesions and dental hygiene poor   Neck supple   Cardiovascular: regular rate and rhythm, no murmurs or gallops   Respiratory:  appears well, vitals normal, no respiratory distress, acyanotic, normal RR, neck free of mass or lymphadenopathy, chest clear, no wheezing, crepitations, rhonchi, normal symmetric air entry   Abdomen: soft, non-tender; bowel sounds normal; no masses,  no organomegaly   Urinary: urethral meatus normal       Assessment:    Pregnancy: G2P1001 Patient Active Problem List   Diagnosis Date Noted  . Supervision of other normal pregnancy, antepartum 05/18/2017        Plan:     Initial labs drawn. Prenatal vitamins. Problem list reviewed and updated. Genetic Screening discussed Quad Screen: after 15 weeks, NIPS ordered.  Ultrasound discussed; fetal survey: 18+ weeks.  Follow up in 4 weeks. 50% of 30 min visit spent on counseling and coordination of care.     Scheryl Darter 05/18/2017

## 2017-05-19 LAB — CYTOLOGY - PAP
BACTERIAL VAGINITIS: POSITIVE — AB
Candida vaginitis: NEGATIVE
Chlamydia: NEGATIVE
DIAGNOSIS: NEGATIVE
Neisseria Gonorrhea: NEGATIVE
TRICH (WINDOWPATH): NEGATIVE

## 2017-05-19 LAB — OBSTETRIC PANEL, INCLUDING HIV
Antibody Screen: NEGATIVE
BASOS ABS: 0 10*3/uL (ref 0.0–0.2)
Basos: 0 %
EOS (ABSOLUTE): 0.1 10*3/uL (ref 0.0–0.4)
Eos: 1 %
HEP B S AG: NEGATIVE
HIV SCREEN 4TH GENERATION: NONREACTIVE
Hematocrit: 38.1 % (ref 34.0–46.6)
Hemoglobin: 12.7 g/dL (ref 11.1–15.9)
IMMATURE GRANULOCYTES: 0 %
Immature Grans (Abs): 0 10*3/uL (ref 0.0–0.1)
LYMPHS ABS: 1.8 10*3/uL (ref 0.7–3.1)
Lymphs: 17 %
MCH: 30.6 pg (ref 26.6–33.0)
MCHC: 33.3 g/dL (ref 31.5–35.7)
MCV: 92 fL (ref 79–97)
Monocytes Absolute: 0.7 10*3/uL (ref 0.1–0.9)
Monocytes: 6 %
NEUTROS PCT: 76 %
Neutrophils Absolute: 8.1 10*3/uL — ABNORMAL HIGH (ref 1.4–7.0)
PLATELETS: 275 10*3/uL (ref 150–379)
RBC: 4.15 x10E6/uL (ref 3.77–5.28)
RDW: 13.4 % (ref 12.3–15.4)
RPR Ser Ql: NONREACTIVE
Rh Factor: POSITIVE
Rubella Antibodies, IGG: 1.88 index (ref >0.99)
WBC: 10.7 10*3/uL (ref 3.4–10.8)

## 2017-05-19 LAB — HEMOGLOBINOPATHY EVALUATION
FERRITIN: 19 ng/mL (ref 15–150)
HGB F QUANT: 0 % (ref 0.0–2.0)
Hgb A2 Quant: 2.4 % (ref 1.8–3.2)
Hgb A: 97.6 % (ref 96.4–98.8)
Hgb C: 0 %
Hgb S: 0 %
Hgb Solubility: NEGATIVE
Hgb Variant: 0 %

## 2017-05-19 LAB — GLUCOSE TOLERANCE, 1 HOUR: Glucose, 1Hr PP: 66 mg/dL (ref 65–199)

## 2017-05-24 DIAGNOSIS — Z87891 Personal history of nicotine dependence: Secondary | ICD-10-CM

## 2017-05-24 DIAGNOSIS — O34219 Maternal care for unspecified type scar from previous cesarean delivery: Secondary | ICD-10-CM

## 2017-05-25 ENCOUNTER — Encounter: Payer: Self-pay | Admitting: Obstetrics & Gynecology

## 2017-05-29 ENCOUNTER — Encounter: Payer: Self-pay | Admitting: Family Medicine

## 2017-05-29 DIAGNOSIS — O34219 Maternal care for unspecified type scar from previous cesarean delivery: Secondary | ICD-10-CM | POA: Insufficient documentation

## 2017-06-02 ENCOUNTER — Encounter: Payer: Self-pay | Admitting: *Deleted

## 2017-06-15 ENCOUNTER — Ambulatory Visit (INDEPENDENT_AMBULATORY_CARE_PROVIDER_SITE_OTHER): Payer: Medicaid Other | Admitting: Student

## 2017-06-15 VITALS — BP 116/75 | HR 80 | Wt 191.6 lb

## 2017-06-15 DIAGNOSIS — N760 Acute vaginitis: Secondary | ICD-10-CM

## 2017-06-15 DIAGNOSIS — B9689 Other specified bacterial agents as the cause of diseases classified elsewhere: Secondary | ICD-10-CM

## 2017-06-15 DIAGNOSIS — Z3480 Encounter for supervision of other normal pregnancy, unspecified trimester: Secondary | ICD-10-CM

## 2017-06-15 DIAGNOSIS — O34219 Maternal care for unspecified type scar from previous cesarean delivery: Secondary | ICD-10-CM

## 2017-06-15 HISTORY — DX: Other specified bacterial agents as the cause of diseases classified elsewhere: B96.89

## 2017-06-15 HISTORY — DX: Acute vaginitis: N76.0

## 2017-06-15 NOTE — Progress Notes (Addendum)
   PRENATAL VISIT NOTE  Subjective:  Erin Benjamin is a 26 y.o. G2P1001 at 4061w6d being seen today for ongoing prenatal care.  She is currently monitored for the following issues for this low-risk pregnancy and has Supervision of other normal pregnancy, antepartum; Former cigarette smoker; and Previous cesarean delivery affecting pregnancy, antepartum on her problem list.  Patient reports no complaints.  Contractions: Not present. Vag. Bleeding: None.  Movement: Present. Denies leaking of fluid.   The following portions of the patient's history were reviewed and updated as appropriate: allergies, current medications, past family history, past medical history, past social history, past surgical history and problem list. Problem list updated.  Objective:   Vitals:   06/15/17 0928  BP: 116/75  Pulse: 80  Weight: 191 lb 9.6 oz (86.9 kg)    Fetal Status: Fetal Heart Rate (bpm): 158   Movement: Present     General:  Alert, oriented and cooperative. Patient is in no acute distress.  Skin: Skin is warm and dry. No rash noted.   Cardiovascular: Normal heart rate noted  Respiratory: Normal respiratory effort, no problems with respiration noted  Abdomen: Soft, gravid, appropriate for gestational age.  Pain/Pressure: Present     Pelvic: Cervical exam deferred        Extremities: Normal range of motion.     Mental Status:  Normal mood and affect. Normal behavior. Normal judgment and thought content.   Assessment and Plan:  Pregnancy: G2P1001 at 5561w6d  1. Supervision of other normal pregnancy, antepartum Doing well; anatomy scan scheduled.   2. Previous cesarean delivery affecting pregnancy, antepartum Patient signed VBAC consent today.   Patient did not get treated for BV from previous visit wet prep, and she denies s/s at this time. Plan to repeat wet prep at next visit.   Preterm labor symptoms and general obstetric precautions including but not limited to vaginal bleeding, contractions,  leaking of fluid and fetal movement were reviewed in detail with the patient. Please refer to After Visit Summary for other counseling recommendations.  Return in about 4 weeks (around 07/13/2017).   Marylene LandKathryn Lorraine Prapti Grussing, CNM

## 2017-06-15 NOTE — Patient Instructions (Signed)
Vaginal Birth After Cesarean Delivery Vaginal birth after cesarean delivery (VBAC) is giving birth vaginally after previously delivering a baby by a cesarean. In the past, if a woman had a cesarean delivery, all births afterward would be done by cesarean delivery. This is no longer true. It can be safe for the mother to try a vaginal delivery after having a cesarean delivery. It is important to discuss VBAC with your health care provider early in the pregnancy so you can understand the risks, benefits, and options. It will give you time to decide what is best in your particular case. The final decision about whether to have a VBAC or repeat cesarean delivery should be between you and your health care provider. Any changes in your health or your baby's health during your pregnancy may make it necessary to change your initial decision about VBAC. Women who plan to have a VBAC should check with their health care provider to be sure that:  The previous cesarean delivery was done with a low transverse uterine cut (incision) (not a vertical classical incision).  The birth canal is big enough for the baby.  There were no other operations on the uterus.  An electronic fetal monitor (EFM) will be on at all times during labor.  An operating room will be available and ready in case an emergency cesarean delivery is needed.  A health care provider and surgical nursing staff will be available at all times during labor to be ready to do an emergency delivery cesarean if necessary.  An anesthesiologist will be present in case an emergency cesarean delivery is needed.  The nursery is prepared and has adequate personnel and necessary equipment available to care for the baby in case of an emergency cesarean delivery. Benefits of VBAC  Shorter stay in the hospital.  Avoidance of risks associated with cesarean delivery, such as: ? Surgical complications, such as opening of the incision or hernia in the  incision. ? Injury to other organs. ? Fever. This can occur if an infection develops after surgery. It can also occur as a reaction to the medicine given to make you numb during the surgery.  Less blood loss and need for blood transfusions.  Lower risk of blood clots and infection.  Shorter recovery.  Decreased risk for having to remove the uterus (hysterectomy).  Decreased risk for the placenta to completely or partially cover the opening of the uterus (placenta previa) with a future pregnancy.  Decrease risk in future labor and delivery. Risks of a VBAC  Tearing (rupture) of the uterus. This is occurs in less than 1% of VBACs. The risk of this happening is higher if: ? Steps are taken to begin the labor process (induce labor) or stimulate or strengthen contractions (augment labor). ? Medicine is used to soften (ripen) the cervix.  Having to remove the uterus (hysterectomy) if it ruptures. VBAC should not be done if:  The previous cesarean delivery was done with a vertical (classical) or T-shaped incision or you do not know what kind of incision was made.  You had a ruptured uterus.  You have had certain types of surgery on your uterus, such as removal of uterine fibroids. Ask your health care provider about other types of surgeries that prevent you from having a VBAC.  You have certain medical or childbirth (obstetrical) problems.  There are problems with the baby.  You have had two previous cesarean deliveries and no vaginal deliveries. Other facts to know about VBAC:  It   is safe to have an epidural anesthetic with VBAC.  It is safe to turn the baby from a breech position (attempt an external cephalic version).  It is safe to try a VBAC with twins.  VBAC may not be successful if your baby weights 8.8 lb (4 kg) or more. However, weight predictions are not always accurate and should not be used alone to decide if VBAC is right for you.  There is an increased failure rate  if the time between the cesarean delivery and VBAC is less than 19 months.  Your health care provider may advise against a VBAC if you have preeclampsia (high blood pressure, protein in the urine, and swelling of face and extremities).  VBAC is often successful if you previously gave birth vaginally.  VBAC is often successful when the labor starts spontaneously before the due date.  Delivering a baby through a VBAC is similar to having a normal spontaneous vaginal delivery. This information is not intended to replace advice given to you by your health care provider. Make sure you discuss any questions you have with your health care provider. Document Released: 01/22/2007 Document Revised: 01/07/2016 Document Reviewed: 02/28/2013 Elsevier Interactive Patient Education  2018 [employer] Inc[employer]  

## 2017-06-23 ENCOUNTER — Encounter: Payer: Self-pay | Admitting: *Deleted

## 2017-06-26 ENCOUNTER — Other Ambulatory Visit: Payer: Self-pay | Admitting: Obstetrics & Gynecology

## 2017-06-26 ENCOUNTER — Ambulatory Visit (HOSPITAL_COMMUNITY)
Admission: RE | Admit: 2017-06-26 | Discharge: 2017-06-26 | Disposition: A | Discharge status: Home / Self Care | Payer: Medicaid Other | Source: Ambulatory Visit | Attending: Obstetrics & Gynecology | Admitting: Obstetrics & Gynecology

## 2017-06-26 DIAGNOSIS — Z3A19 19 weeks gestation of pregnancy: Secondary | ICD-10-CM | POA: Insufficient documentation

## 2017-06-26 DIAGNOSIS — Z3480 Encounter for supervision of other normal pregnancy, unspecified trimester: Secondary | ICD-10-CM

## 2017-06-26 DIAGNOSIS — Z87891 Personal history of nicotine dependence: Secondary | ICD-10-CM | POA: Diagnosis present

## 2017-06-26 DIAGNOSIS — O321XX0 Maternal care for breech presentation, not applicable or unspecified: Secondary | ICD-10-CM | POA: Diagnosis not present

## 2017-07-13 ENCOUNTER — Ambulatory Visit (INDEPENDENT_AMBULATORY_CARE_PROVIDER_SITE_OTHER): Payer: Medicaid Other | Admitting: Obstetrics and Gynecology

## 2017-07-13 ENCOUNTER — Encounter: Payer: Self-pay | Admitting: Obstetrics and Gynecology

## 2017-07-13 VITALS — BP 107/64 | HR 75 | Wt 200.1 lb

## 2017-07-13 DIAGNOSIS — Z3689 Encounter for other specified antenatal screening: Secondary | ICD-10-CM

## 2017-07-13 DIAGNOSIS — O34219 Maternal care for unspecified type scar from previous cesarean delivery: Secondary | ICD-10-CM

## 2017-07-13 DIAGNOSIS — Z3480 Encounter for supervision of other normal pregnancy, unspecified trimester: Secondary | ICD-10-CM

## 2017-07-13 MED ORDER — PRENATAL VITAMINS 0.8 MG PO TABS: 1.0000 | ORAL_TABLET | Freq: Every day | ORAL | 12 refills | Status: DC

## 2017-07-13 NOTE — Progress Notes (Signed)
   PRENATAL VISIT NOTE  Subjective:  Erin Benjamin is a 26 y.o. G2P1001 at 4544w6d being seen today for ongoing prenatal care.  She is currently monitored for the following issues for this low-risk pregnancy and has Supervision of other normal pregnancy, antepartum; Former cigarette smoker; Previous cesarean delivery affecting pregnancy, antepartum; and Bacterial vaginosis on their problem list.  Patient reports some cramping.  Contractions: Not present. Vag. Bleeding: None.  Movement: Present. Denies leaking of fluid.   The following portions of the patient's history were reviewed and updated as appropriate: allergies, current medications, past family history, past medical history, past social history, past surgical history and problem list. Problem list updated.  Objective:   Vitals:   07/13/17 0909  BP: 107/64  Pulse: 75  Weight: 200 lb 1.6 oz (90.8 kg)    Fetal Status:     Movement: Present     General:  Alert, oriented and cooperative. Patient is in no acute distress.  Skin: Skin is warm and dry. No rash noted.   Cardiovascular: Normal heart rate noted  Respiratory: Normal respiratory effort, no problems with respiration noted  Abdomen: Soft, gravid, appropriate for gestational age.  Pain/Pressure: Present     Pelvic: Cervical exam deferred        Extremities: Normal range of motion.  Edema: None  Mental Status:  Normal mood and affect. Normal behavior. Normal judgment and thought content.   Assessment and Plan:  Pregnancy: G2P1001 at 5444w6d  1. Supervision of other normal pregnancy, antepartum - Prenatal Multivit-Min-Fe-FA (PRENATAL VITAMINS) 0.8 MG tablet; Take 1 tablet by mouth daily.  Dispense: 30 tablet; Refill: 12 Reviewed birth control options today  2. Screening, antenatal, for fetal anatomic survey Incomplete 06/26/17 rescan scheduled today 07/21/17  3. Previous cesarean delivery affecting pregnancy, antepartum For TOLAC   Preterm labor symptoms and general  obstetric precautions including but not limited to vaginal bleeding, contractions, leaking of fluid and fetal movement were reviewed in detail with the patient. Please refer to After Visit Summary for other counseling recommendations.  Return in about 3 weeks (around 08/03/2017) for OB visit.   Conan BowensKelly M Aiyanah Kalama, MD

## 2017-07-13 NOTE — Patient Instructions (Signed)
Contraception Choices Contraception (birth control) is the use of any methods or devices to prevent pregnancy. Below are some methods to help avoid pregnancy. Hormonal methods  Contraceptive implant. This is a thin, plastic tube containing progesterone hormone. It does not contain estrogen hormone. Your health care provider inserts the tube in the inner part of the upper arm. The tube can remain in place for up to 3 years. After 3 years, the implant must be removed. The implant prevents the ovaries from releasing an egg (ovulation), thickens the cervical mucus to prevent sperm from entering the uterus, and thins the lining of the inside of the uterus.  Progesterone-only injections. These injections are given every 3 months by your health care provider to prevent pregnancy. This synthetic progesterone hormone stops the ovaries from releasing eggs. It also thickens cervical mucus and changes the uterine lining. This makes it harder for sperm to survive in the uterus.  Birth control pills. These pills contain estrogen and progesterone hormone. They work by preventing the ovaries from releasing eggs (ovulation). They also cause the cervical mucus to thicken, preventing the sperm from entering the uterus. Birth control pills are prescribed by a health care provider.Birth control pills can also be used to treat heavy periods.  Minipill. This type of birth control pill contains only the progesterone hormone. They are taken every day of each month and must be prescribed by your health care provider.  Birth control patch. The patch contains hormones similar to those in birth control pills. It must be changed once a week and is prescribed by a health care provider.  Vaginal ring. The ring contains hormones similar to those in birth control pills. It is left in the vagina for 3 weeks, removed for 1 week, and then a new one is put back in place. The patient must be comfortable inserting and removing the ring from  the vagina.A health care provider's prescription is necessary.  Emergency contraception. Emergency contraceptives prevent pregnancy after unprotected sexual intercourse. This pill can be taken right after sex or up to 5 days after unprotected sex. It is most effective the sooner you take the pills after having sexual intercourse. Most emergency contraceptive pills are available without a prescription. Check with your pharmacist. Do not use emergency contraception as your only form of birth control. Barrier methods  Female condom. This is a thin sheath (latex or rubber) that is worn over the penis during sexual intercourse. It can be used with spermicide to increase effectiveness.  Female condom. This is a soft, loose-fitting sheath that is put into the vagina before sexual intercourse.  Diaphragm. This is a soft, latex, dome-shaped barrier that must be fitted by a health care provider. It is inserted into the vagina, along with a spermicidal jelly. It is inserted before intercourse. The diaphragm should be left in the vagina for 6 to 8 hours after intercourse.  Cervical cap. This is a round, soft, latex or plastic cup that fits over the cervix and must be fitted by a health care provider. The cap can be left in place for up to 48 hours after intercourse.  Sponge. This is a soft, circular piece of polyurethane foam. The sponge has spermicide in it. It is inserted into the vagina after wetting it and before sexual intercourse.  Spermicides. These are chemicals that kill or block sperm from entering the cervix and uterus. They come in the form of creams, jellies, suppositories, foam, or tablets. They do not require a prescription. They   are inserted into the vagina with an applicator before having sexual intercourse. The process must be repeated every time you have sexual intercourse. Intrauterine contraception  Intrauterine device (IUD). This is a T-shaped device that is put in a woman's uterus during  a menstrual period to prevent pregnancy. There are 2 types: ? Copper IUD. This type of IUD is wrapped in copper wire and is placed inside the uterus. Copper makes the uterus and fallopian tubes produce a fluid that kills sperm. It can stay in place for 10 years. ? Hormone IUD. This type of IUD contains the hormone progestin (synthetic progesterone). The hormone thickens the cervical mucus and prevents sperm from entering the uterus, and it also thins the uterine lining to prevent implantation of a fertilized egg. The hormone can weaken or kill the sperm that get into the uterus. It can stay in place for 3-5 years, depending on which type of IUD is used. Permanent methods of contraception  Female tubal ligation. This is when the woman's fallopian tubes are surgically sealed, tied, or blocked to prevent the egg from traveling to the uterus.  Hysteroscopic sterilization. This involves placing a small coil or insert into each fallopian tube. Your doctor uses a technique called hysteroscopy to do the procedure. The device causes scar tissue to form. This results in permanent blockage of the fallopian tubes, so the sperm cannot fertilize the egg. It takes about 3 months after the procedure for the tubes to become blocked. You must use another form of birth control for these 3 months.  Female sterilization. This is when the female has the tubes that carry sperm tied off (vasectomy).This blocks sperm from entering the vagina during sexual intercourse. After the procedure, the man can still ejaculate fluid (semen). Natural planning methods  Natural family planning. This is not having sexual intercourse or using a barrier method (condom, diaphragm, cervical cap) on days the woman could become pregnant.  Calendar method. This is keeping track of the length of each menstrual cycle and identifying when you are fertile.  Ovulation method. This is avoiding sexual intercourse during ovulation.  Symptothermal method.  This is avoiding sexual intercourse during ovulation, using a thermometer and ovulation symptoms.  Post-ovulation method. This is timing sexual intercourse after you have ovulated. Regardless of which type or method of contraception you choose, it is important that you use condoms to protect against the transmission of sexually transmitted infections (STIs). Talk with your health care provider about which form of contraception is most appropriate for you. This information is not intended to replace advice given to you by your health care provider. Make sure you discuss any questions you have with your health care provider. Document Released: 08/01/2005 Document Revised: 01/07/2016 Document Reviewed: 01/24/2013 Elsevier Interactive Patient Education  2017 [employer] Inc.  

## 2017-07-21 ENCOUNTER — Other Ambulatory Visit: Payer: Self-pay | Admitting: Obstetrics and Gynecology

## 2017-07-21 ENCOUNTER — Ambulatory Visit (HOSPITAL_COMMUNITY)
Admission: RE | Admit: 2017-07-21 | Discharge: 2017-07-21 | Disposition: A | Discharge status: Home / Self Care | Payer: Medicaid Other | Source: Ambulatory Visit | Attending: Obstetrics and Gynecology | Admitting: Obstetrics and Gynecology

## 2017-07-21 DIAGNOSIS — O34219 Maternal care for unspecified type scar from previous cesarean delivery: Secondary | ICD-10-CM | POA: Diagnosis present

## 2017-07-21 DIAGNOSIS — Z3A23 23 weeks gestation of pregnancy: Secondary | ICD-10-CM

## 2017-07-21 DIAGNOSIS — Z362 Encounter for other antenatal screening follow-up: Secondary | ICD-10-CM

## 2017-07-21 DIAGNOSIS — Z3A22 22 weeks gestation of pregnancy: Secondary | ICD-10-CM | POA: Diagnosis not present

## 2017-07-21 DIAGNOSIS — Z3689 Encounter for other specified antenatal screening: Secondary | ICD-10-CM

## 2017-08-03 ENCOUNTER — Encounter: Payer: Medicaid Other | Admitting: Advanced Practice Midwife

## 2017-08-21 ENCOUNTER — Encounter: Payer: Self-pay | Admitting: Advanced Practice Midwife

## 2017-08-21 ENCOUNTER — Encounter: Payer: Medicaid Other | Admitting: Advanced Practice Midwife

## 2017-08-21 ENCOUNTER — Ambulatory Visit (INDEPENDENT_AMBULATORY_CARE_PROVIDER_SITE_OTHER): Payer: Medicaid Other | Admitting: Advanced Practice Midwife

## 2017-08-21 ENCOUNTER — Other Ambulatory Visit (HOSPITAL_COMMUNITY)
Admission: RE | Admit: 2017-08-21 | Discharge: 2017-08-21 | Disposition: A | Discharge status: Home / Self Care | Payer: Medicaid Other | Source: Ambulatory Visit | Attending: Advanced Practice Midwife | Admitting: Advanced Practice Midwife

## 2017-08-21 VITALS — BP 113/70 | HR 71 | Wt 211.5 lb

## 2017-08-21 DIAGNOSIS — Z3480 Encounter for supervision of other normal pregnancy, unspecified trimester: Secondary | ICD-10-CM

## 2017-08-21 DIAGNOSIS — O34219 Maternal care for unspecified type scar from previous cesarean delivery: Secondary | ICD-10-CM

## 2017-08-21 DIAGNOSIS — B373 Candidiasis of vulva and vagina: Secondary | ICD-10-CM | POA: Diagnosis not present

## 2017-08-21 DIAGNOSIS — L299 Pruritus, unspecified: Secondary | ICD-10-CM

## 2017-08-21 NOTE — Addendum Note (Signed)
Addended by: Mikey BussingWILSON, Evalisse Prajapati L on: 08/21/2017 04:56 PM   Modules accepted: Orders

## 2017-08-21 NOTE — Progress Notes (Signed)
   PRENATAL VISIT NOTE  Subjective:  Erin Benjamin is a 27 y.o. G2P1001 at 353w3d being seen today for ongoing prenatal care.  She is currently monitored for the following issues for this low-risk pregnancy and has Supervision of other normal pregnancy, antepartum; Former cigarette smoker; Previous cesarean delivery affecting pregnancy, antepartum; and Bacterial vaginosis on their problem list.  Patient reports itching on face and breast.  Contractions: Not present. Vag. Bleeding: None.  Movement: Present. Denies leaking of fluid.   The following portions of the patient's history were reviewed and updated as appropriate: allergies, current medications, past family history, past medical history, past social history, past surgical history and problem list. Problem list updated.  Objective:   Vitals:   08/21/17 1638  BP: 113/70  Pulse: 71  Weight: 211 lb 8 oz (95.9 kg)    Fetal Status: Fetal Heart Rate (bpm): 156 Fundal Height: 28 cm Movement: Present     General:  Alert, oriented and cooperative. Patient is in no acute distress.  Skin: Skin is warm and dry. No rash noted.   Cardiovascular: Normal heart rate noted  Respiratory: Normal respiratory effort, no problems with respiration noted  Abdomen: Soft, gravid, appropriate for gestational age.  Pain/Pressure: Present     Pelvic: Cervical exam deferred        Extremities: Normal range of motion.  Edema: None  Mental Status:  Normal mood and affect. Normal behavior. Normal judgment and thought content.   Assessment and Plan:  Pregnancy: G2P1001 at [redacted]w[redacted]d  1. Supervision of other normal pregnancy, antepartum - Routine care - 2 hour GTT later this week - Wet prep - Bile acids  2. Previous cesarean delivery affecting pregnancy, antepartum - Plans TOAC, consent signed 06/15/17 under media tab   Preterm labor symptoms and general obstetric precautions including but not limited to vaginal bleeding, contractions, leaking of fluid and  fetal movement were reviewed in detail with the patient. Please refer to After Visit Summary for other counseling recommendations.  Return in about 2 weeks (around 09/04/2017).   Thressa ShellerHeather Abby Stines, CNM

## 2017-08-21 NOTE — Patient Instructions (Signed)
AREA PEDIATRIC/FAMILY PRACTICE PHYSICIANS   [facility] [address], [address] [place], [place]  [address] Phone - [phone]   Fax - [phone]  [facility] [address] [place], [place] [address] Phone - [phone]   Fax - [phone]  [facility] AM[facility] [address] [place], [place]  [address] Phone - [phone]   Fax - [phone]  [facility] [address] [place], [place]  [address] Phone - [phone]   Fax - [phone]  [facility] [address] [place], [place]  [address] Phone - [phone]   Fax - [phone]  [facility] [address] [place], [place]  [address] Phone - [phone]   Fax - [phone]  [facility] [address] [place], [place]  [address] Phone - [phone]   Fax - [phone]  [facility] [address] [place], [place]  [address] Phone - [phone]   Fax - [phone]  [facility] [address] [place], [place]  [address] Phone - [phone]   Fax - [phone] [facility] [address] [place], [place]  [address] Phone - [phone]   Fax - [phone]  [facility] [address] [place], [place]  [address] Phone - [phone]   Fax - [phone]  [facility] [address] [place], [place]  [address] Phone - [phone]   Fax - [phone]  [facility] [address] [place], [place]  [address] Phone - [phone]   Fax - [phone]  SHILPA GOSRANI [address] [place], [place]  [address] Phone - [phone]  [facility] [address] [place], [place]  [address] Phone - [phone]   Fax - [phone]  [facility] [address] [place], [place]  [address] Phone - [phone]   Fax - [phone]  [facility] [address] [place], [place]  [address] Phone - [phone]   Fax - [phone]  [facility] [address] [place], [place]  [address] Phone - [phone]   Fax - [phone]   [facility] [address] [place], [place]  [address] Phone - [phone]   Fax - [phone]  [facility] [address] [place], [place]  [address] Phone - [phone]   Fax - [phone]  DAVID RUBIN [address] [place], [place]  [address] Phone - [phone]   Fax - [phone]  [facility] [address] [place], [place]  [address] Phone - [phone]   Fax - [phone]  [facility] [address] , [place]  [address] Phone - [phone]   Fax - [phone] [facility] [address] [place], [place]  [address] Phone - [phone]   Fax - [phone]  [facility] [address] [place], [place]  [address] Phone - [phone]   Fax - [phone]  [facility] [address] [place], [place]  [address] Phone - [phone]   Fax - [phone]  [facility] Charlene Flemming MD [address] [place]  [address] Phone [phone]  Fax [phone]  Childbirth Education Options: [facility] Classes:  Childbirth education classes can help you   get ready for a positive parenting experience. You can also meet other expectant parents and get free stuff for your baby. Each class runs for five weeks on the same night and costs $45 for the mother-to-be and her support person. Medicaid covers the cost if you are eligible. Call [phone] to register. [facility] Childbirth Education:  [phone] or [phone] or  [email or web address]  Baby & Me Class: Discuss newborn & infant parenting and family adjustment issues with other new mothers in a relaxed environment. Each week brings a new speaker or baby-centered activity. We encourage new mothers to join us every Thursday at 11:00am. Babies birth until crawling. No registration or fee. Daddy Boot Camp: This course offers Dads-to-be the tools and knowledge needed to feel confident on their journey to becoming new fathers. Experienced dads, who have been trained as coaches, teach dads-to-be how to hold, comfort, diaper, swaddle and play with their infant while being able to support the new mom as well. A class for men taught by men. $25/dad Big Brother/Big Sister: Let your children share in the joy of a new brother or sister in this special class designed just for them. Class includes discussion about how families care for babies: swaddling, holding, diapering, safety as well as how they can be helpful in their new role. This class is designed for children ages 2 to 6, but any age is welcome. Please register each child individually. $5/child  Mom Talk: This mom-led group offers support and connection to mothers as they journey through the adjustments and struggles of that sometimes overwhelming first year after the birth of a child. Tuesdays at 10:00am and Thursdays at 6:00pm. Babies welcome. No registration or fee. Breastfeeding Support Group: This group is a mother-to-mother support circle where moms have the opportunity to share their breastfeeding experiences. A Lactation Consultant is present for questions and concerns. Meets each Tuesday at 11:00am. No fee or registration. Breastfeeding Your Baby: Learn what to expect in the first days of breastfeeding your newborn.  This class will help you feel more confident with the skills needed to begin your breastfeeding experience. Many new mothers are concerned about breastfeeding after leaving the hospital. This class  will also address the most common fears and challenges about breastfeeding during the first few weeks, months and beyond. (call for fee) Comfort Techniques and Tour: This 2 hour interactive class will provide you the opportunity to learn & practice hands-on techniques that can help relieve some of the discomfort of labor and encourage your baby to rotate toward the best position for birth. You and your partner will be able to try a variety of labor positions with birth balls and rebozos as well as practice breathing, relaxation, and visualization techniques. A tour of the [facility] is included with this class. $20 per registrant and support person Childbirth Class- Weekend Option: This class is a Weekend version of our Birth & Baby series. It is designed for parents who have a difficult time fitting several weeks of classes into their schedule. It covers the care of your newborn and the basics of labor and childbirth. It also includes a Maternity Care Center Tour of [facility] and lunch. The class is held two consecutive days: beginning on Friday evening from 6:30 - 8:30 p.m. and the next day, Saturday from 9 a.m. - 4 p.m. (call for fee) Waterbirth Class: Interested in a waterbirth?  This informational class will help you discover whether waterbirth is the right fit for you.   Education about waterbirth itself, supplies you would need and how to assemble your support team is what you can expect from this class. Some obstetrical practices require this class in order to pursue a waterbirth. (Not all obstetrical practices offer waterbirth-check with your healthcare provider.) Register only the expectant mom, but you are encouraged to bring your partner to class! Required if planning waterbirth, no fee. Infant/Child CPR: Parents, grandparents, babysitters, and friends learn Cardio-Pulmonary Resuscitation skills for infants and children. You will also learn how to treat both conscious  and unconscious choking in infants and children. This Family & Friends program does not offer certification. Register each participant individually to ensure that enough mannequins are available. (Call for fee) Grandparent Love: Expecting a grandbaby? This class is for you! Learn about the latest infant care and safety recommendations and ways to support your own child as he or she transitions into the parenting role. Taught by Registered Nurses who are childbirth instructors, but most importantly...they are grandmothers too! $10/person. Childbirth Class- Natural Childbirth: This series of 5 weekly classes is for expectant parents who want to learn and practice natural methods of coping with the process of labor and childbirth. Relaxation, breathing, massage, visualization, role of the partner, and helpful positioning are highlighted. Participants learn how to be confident in their body's ability to give birth. This class will empower and help parents make informed decisions about their own care. Includes discussion that will help new parents transition into the immediate postpartum period. [facility] is included. We suggest taking this class between 25-32 weeks, but it's only a recommendation. $75 per registrant and one support person or $30 Medicaid. Childbirth Class- 3 week Series: This option of 3 weekly classes helps you and your labor partner prepare for childbirth. Newborn care, labor & birth, cesarean birth, pain management, and comfort techniques are discussed and a [place] [facility] is included. The class meets at the same time, on the same day of the week for 3 consecutive weeks beginning with the starting date you choose. $60 for registrant and one support person.  Marvelous Multiples: Expecting twins, triplets, or more? This class covers the differences in labor, birth, parenting, and breastfeeding issues that face multiples' parents.  NICU tour is included. Led by a Certified Childbirth Educator who is the mother of twins. No fee. Caring for Baby: This class is for expectant and adoptive parents who want to learn and practice the most up-to-date newborn care for their babies. Focus is on birth through the first six weeks of life. Topics include feeding, bathing, diapering, crying, umbilical cord care, circumcision care and safe sleep. Parents learn to recognize symptoms of illness and when to call the pediatrician. Register only the mom-to-be and your partner or support person can plan to come with you! $10 per registrant and support person Childbirth Class- online option: This online class offers you the freedom to complete a Birth and Baby series in the comfort of your own home. The flexibility of this option allows you to review sections at your own pace, at times convenient to you and your support people. It includes additional video information, animations, quizzes, and extended activities. Get organized with helpful eClass tools, checklists, and trackers. Once you register online for the class, you will receive an email within a few days to accept the invitation and begin the class when the time is right for you. The content will be available to you for 60 days. $  60 for 60 days of online access for you and your support people.  Local Doulas: Natural Baby Doulas [email or web address] Tel: [phone] [email or web address] [employer] [employer] [phone] [email or web address] The Labor Merla RichesLadies  (also do waterbirth tub rental) [phone] [email or web address] [facility] [phone] [email or web address] [facility]hythms  [phone] [email or web address] [employer] (PADA) [email or web address] [place] [facility]   [email or web address]   Places to have your son circumcised:    [facility] [phone] $480 while you are in hospital  [facility] [phone] $244 by 4 wks  [facility] [phone] $175 by 2 wks  [facility] [phone] $250 by 7 days [facility] [phone] $150 by 4 wks  These prices sometimes change but are roughly what you can expect to pay. Please call and confirm pricing.   Circumcision is considered an elective/non-medically necessary procedure. There are many reasons parents decide to have their sons circumsized. During the first year of life circumcised males have a reduced risk of urinary tract infections but after this year the rates between circumcised males and uncircumcised males are the same.  It is safe to have your son circumcised outside of the hospital and the places above perform them regularly.

## 2017-08-22 LAB — COMPREHENSIVE METABOLIC PANEL WITH GFR
ALT: 12 IU/L (ref 0–32)
AST: 16 IU/L (ref 0–40)
Albumin/Globulin Ratio: 1.1 — ABNORMAL LOW (ref 1.2–2.2)
Albumin: 3.5 g/dL (ref 3.5–5.5)
Alkaline Phosphatase: 75 IU/L (ref 39–117)
BUN/Creatinine Ratio: 13 (ref 9–23)
BUN: 8 mg/dL (ref 6–20)
Bilirubin Total: <0.2 mg/dL (ref 0.0–1.2)
CO2: 19 mmol/L — ABNORMAL LOW (ref 20–29)
Calcium: 8.9 mg/dL (ref 8.7–10.2)
Chloride: 99 mmol/L (ref 96–106)
Creatinine, Ser: 0.6 mg/dL (ref 0.57–1.00)
GFR calc Af Amer: 147 mL/min/1.73
GFR calc non Af Amer: 127 mL/min/1.73
Globulin, Total: 3.1 g/dL (ref 1.5–4.5)
Glucose: 69 mg/dL (ref 65–99)
Potassium: 4.3 mmol/L (ref 3.5–5.2)
Sodium: 136 mmol/L (ref 134–144)
Total Protein: 6.6 g/dL (ref 6.0–8.5)

## 2017-08-22 LAB — BILE ACIDS, TOTAL: BILE ACIDS TOTAL: 9.1 umol/L (ref 4.7–24.5)

## 2017-08-23 LAB — CERVICOVAGINAL ANCILLARY ONLY
BACTERIAL VAGINITIS: NEGATIVE
CANDIDA VAGINITIS: POSITIVE — AB
TRICH (WINDOWPATH): NEGATIVE

## 2017-08-26 ENCOUNTER — Other Ambulatory Visit: Payer: Self-pay | Admitting: Advanced Practice Midwife

## 2017-08-26 MED ORDER — TERCONAZOLE 0.4 % VA CREA: 1.0000 | TOPICAL_CREAM | Freq: Every day | VAGINAL | 0 refills | Status: DC

## 2017-09-05 ENCOUNTER — Ambulatory Visit (INDEPENDENT_AMBULATORY_CARE_PROVIDER_SITE_OTHER): Payer: Medicaid Other

## 2017-09-05 VITALS — BP 128/66 | HR 78

## 2017-09-05 DIAGNOSIS — Z3483 Encounter for supervision of other normal pregnancy, third trimester: Secondary | ICD-10-CM

## 2017-09-05 DIAGNOSIS — Z23 Encounter for immunization: Secondary | ICD-10-CM | POA: Diagnosis not present

## 2017-09-05 DIAGNOSIS — Z3480 Encounter for supervision of other normal pregnancy, unspecified trimester: Secondary | ICD-10-CM

## 2017-09-05 NOTE — Progress Notes (Signed)
   PRENATAL VISIT NOTE  Subjective:  Erin Benjamin is a 27 y.o. G2P1001 at 7210w4d being seen today for ongoing prenatal care.  She is currently monitored for the following issues for this low-risk pregnancy and has Supervision of other normal pregnancy, antepartum; Former cigarette smoker; Previous cesarean delivery affecting pregnancy, antepartum; and Bacterial vaginosis on their problem list.  Patient reports no complaints.  Contractions: Not present. Vag. Bleeding: None.  Movement: Present. Denies leaking of fluid.   The following portions of the patient's history were reviewed and updated as appropriate: allergies, current medications, past family history, past medical history, past social history, past surgical history and problem list. Problem list updated.  Objective:   Vitals:   09/05/17 0837  BP: 128/66  Pulse: 78    Fetal Status: Fetal Heart Rate (bpm): 150 Fundal Height: 29 cm Movement: Present     General:  Alert, oriented and cooperative. Patient is in no acute distress.  Skin: Skin is warm and dry. No rash noted.   Cardiovascular: Normal heart rate noted  Respiratory: Normal respiratory effort, no problems with respiration noted  Abdomen: Soft, gravid, appropriate for gestational age.  Pain/Pressure: Present     Pelvic: Cervical exam deferred        Extremities: Normal range of motion.  Edema: None  Mental Status:  Normal mood and affect. Normal behavior. Normal judgment and thought content.   Assessment and Plan:  Pregnancy: G2P1001 at 3310w4d  1. Supervision of other normal pregnancy, antepartum -Routine care - Glucose Tolerance, 2 Hours w/1 Hour - CBC  Preterm labor symptoms and general obstetric precautions including but not limited to vaginal bleeding, contractions, leaking of fluid and fetal movement were reviewed in detail with the patient. Please refer to After Visit Summary for other counseling recommendations.  Return in about 2 weeks (around 09/19/2017) for  Return OB visit.   Rolm BookbinderCaroline M Telsa Dillavou, CNM  09/05/17 8:55 AM

## 2017-09-05 NOTE — Progress Notes (Signed)
Pt declined Flu Shot, Accepted Tdap

## 2017-09-05 NOTE — Patient Instructions (Addendum)
Glucose Tolerance Test During Pregnancy The glucose tolerance test (GTT) is a blood test used to determine if you have developed a type of diabetes during pregnancy (gestational diabetes). This is when your body does not properly process sugar (glucose) in the food you eat, resulting in high blood glucose levels. Typically, a GTT is done after you have had a 1-hour glucose test with results that indicate you possibly have gestational diabetes. It may also be done if:  You have a history of giving birth to very large babies or have experienced repeated fetal loss (stillbirth).  You have signs and symptoms of diabetes, such as: ? Changes in your vision. ? Tingling or numbness in your hands or feet. ? Changes in hunger, thirst, and urination not otherwise explained by your pregnancy.  The GTT lasts about 3 hours. You will be given a sugar-water solution to drink at the beginning of the test. You will have blood drawn before you drink the solution and then again 1, 2, and 3 hours after you drink it. You will not be allowed to eat or drink anything else during the test. You must remain at the testing location to make sure that your blood is drawn on time. You should also avoid exercising during the test, because exercise can alter test results. How do I prepare for this test? Eat normally for 3 days prior to the GTT test, including having plenty of carbohydrate-rich foods. Do not eat or drink anything except water during the final 12 hours before the test. In addition, your health care provider may ask you to stop taking certain medicines before the test. What do the results mean? It is your responsibility to obtain your test results. Ask the lab or department performing the test when and how you will get your results. Contact your health care provider to discuss any questions you have about your results. Range of Normal Values Ranges for normal values may vary among different labs and hospitals. You  should always check with your health care provider after having lab work or other tests done to discuss whether your values are considered within normal limits. Normal levels of blood glucose are as follows:  Fasting: less than 105 mg/dL.  1 hour after drinking the solution: less than 190 mg/dL.  2 hours after drinking the solution: less than 165 mg/dL.  3 hours after drinking the solution: less than 145 mg/dL.  Some substances can interfere with GTT results. These may include:  Blood pressure and heart failure medicines, including beta blockers, furosemide, and thiazides.  Anti-inflammatory medicines, including aspirin.  Nicotine.  Some psychiatric medicines.  Meaning of Results Outside Normal Value Ranges GTT test results that are above normal values may indicate a number of health problems, such as:  Gestational diabetes.  Acute stress response.  Cushing syndrome.  Tumors such as pheochromocytoma or glucagonoma.  Long-term kidney problems.  Pancreatitis.  Hyperthyroidism.  Current infection.  Discuss your test results with your health care provider. He or she will use the results to make a diagnosis and determine a treatment plan that is right for you. This information is not intended to replace advice given to you by your health care provider. Make sure you discuss any questions you have with your health care provider. Document Released: 01/31/2012 Document Revised: 01/07/2016 Document Reviewed: 12/06/2013 Elsevier Interactive Patient Education  2018 [employer]  Safe Medications in Pregnancy   Acne: Benzoyl Peroxide Salicylic Acid  Backache/Headache: Tylenol: 2 regular strength every 4 hours   OR              2 Extra strength every 6 hours  Colds/Coughs/Allergies: Benadryl (alcohol free) 25 mg every 6 hours as needed Breath right strips Claritin Cepacol throat lozenges Chloraseptic throat spray Cold-Eeze- up to three times per day Cough drops,  alcohol free Flonase (by prescription only) Guaifenesin Mucinex Robitussin DM (plain only, alcohol free) Saline nasal spray/drops Sudafed (pseudoephedrine) & Actifed ** use only after [redacted] weeks gestation and if you do not have high blood pressure Tylenol Vicks Vaporub Zinc lozenges Zyrtec   Constipation: Colace Ducolax suppositories Fleet enema Glycerin suppositories Metamucil Milk of magnesia Miralax Senokot Smooth move tea  Diarrhea: Kaopectate Imodium A-D  *NO pepto Bismol  Hemorrhoids: Anusol Anusol HC Preparation H Tucks  Indigestion: Tums Maalox Mylanta Zantac  Pepcid  Insomnia: Benadryl (alcohol free) 25mg  every 6 hours as needed Tylenol PM Unisom, no Gelcaps  Leg Cramps: Tums MagGel  Nausea/Vomiting:  Bonine Dramamine Emetrol Ginger extract Sea bands Meclizine  Nausea medication to take during pregnancy:  Unisom (doxylamine succinate 25 mg tablets) Take one tablet daily at bedtime. If symptoms are not adequately controlled, the dose can be increased to a maximum recommended dose of two tablets daily (1/2 tablet in the morning, 1/2 tablet mid-afternoon and one at bedtime). Vitamin B6 100mg  tablets. Take one tablet twice a day (up to 200 mg per day).  Skin Rashes: Aveeno products Benadryl cream or 25mg  every 6 hours as needed Calamine Lotion 1% cortisone cream  Yeast infection: Gyne-lotrimin 7 Monistat 7   **If taking multiple medications, please check labels to avoid duplicating the same active ingredients **take medication as directed on the label ** Do not exceed 4000 mg of tylenol in 24 hours **Do not take medications that contain aspirin or ibuprofen    Braxton Hicks Contractions Contractions of the uterus can occur throughout pregnancy, but they are not always a sign that you are in labor. You may have practice contractions called Braxton Hicks contractions. These false labor contractions are sometimes confused with true  labor. What are Deberah PeltonBraxton Hicks contractions? Braxton Hicks contractions are tightening movements that occur in the muscles of the uterus before labor. Unlike true labor contractions, these contractions do not result in opening (dilation) and thinning of the cervix. Toward the end of pregnancy (32-34 weeks), Braxton Hicks contractions can happen more often and may become stronger. These contractions are sometimes difficult to tell apart from true labor because they can be very uncomfortable. You should not feel embarrassed if you go to the hospital with false labor. Sometimes, the only way to tell if you are in true labor is for your health care provider to look for changes in the cervix. The health care provider will do a physical exam and may monitor your contractions. If you are not in true labor, the exam should show that your cervix is not dilating and your water has not broken. If there are other health problems associated with your pregnancy, it is completely safe for you to be sent home with false labor. You may continue to have Braxton Hicks contractions until you go into true labor. How to tell the difference between true labor and false labor True labor  Contractions last 30-70 seconds.  Contractions become very regular.  Discomfort is usually felt in the top of the uterus, and it spreads to the lower abdomen and low back.  Contractions do not go away with walking.  Contractions usually become more intense and increase  in frequency.  The cervix dilates and gets thinner. False labor  Contractions are usually shorter and not as strong as true labor contractions.  Contractions are usually irregular.  Contractions are often felt in the front of the lower abdomen and in the groin.  Contractions may go away when you walk around or change positions while lying down.  Contractions get weaker and are shorter-lasting as time goes on.  The cervix usually does not dilate or become  thin. Follow these instructions at home:  Take over-the-counter and prescription medicines only as told by your health care provider.  Keep up with your usual exercises and follow other instructions from your health care provider.  Eat and drink lightly if you think you are going into labor.  If Braxton Hicks contractions are making you uncomfortable: ? Change your position from lying down or resting to walking, or change from walking to resting. ? Sit and rest in a tub of warm water. ? Drink enough fluid to keep your urine pale yellow. Dehydration may cause these contractions. ? Do slow and deep breathing several times an hour.  Keep all follow-up prenatal visits as told by your health care provider. This is important. Contact a health care provider if:  You have a fever.  You have continuous pain in your abdomen. Get help right away if:  Your contractions become stronger, more regular, and closer together.  You have fluid leaking or gushing from your vagina.  You pass blood-tinged mucus (bloody show).  You have bleeding from your vagina.  You have low back pain that you never had before.  You feel your baby's head pushing down and causing pelvic pressure.  Your baby is not moving inside you as much as it used to. Summary  Contractions that occur before labor are called Braxton Hicks contractions, false labor, or practice contractions.  Braxton Hicks contractions are usually shorter, weaker, farther apart, and less regular than true labor contractions. True labor contractions usually become progressively stronger and regular and they become more frequent.  Manage discomfort from [facility] contractions by changing position, resting in a warm bath, drinking plenty of water, or practicing deep breathing. This information is not intended to replace advice given to you by your health care provider. Make sure you discuss any questions you have with your health care  provider. Document Released: 12/15/2016 Document Revised: 12/15/2016 Document Reviewed: 12/15/2016 Elsevier Interactive Patient Education  2018 [employer].

## 2017-09-05 NOTE — Progress Notes (Signed)
Pt states shortness of breath & feel tightness above stomach

## 2017-09-06 LAB — GLUCOSE TOLERANCE, 2 HOURS W/ 1HR
GLUCOSE, 1 HOUR: 59 mg/dL — AB (ref 65–179)
GLUCOSE, 2 HOUR: 64 mg/dL — AB (ref 65–152)
Glucose, Fasting: 73 mg/dL (ref 65–91)

## 2017-09-19 ENCOUNTER — Ambulatory Visit (INDEPENDENT_AMBULATORY_CARE_PROVIDER_SITE_OTHER): Payer: Medicaid Other | Admitting: Student

## 2017-09-19 ENCOUNTER — Encounter: Payer: Self-pay | Admitting: Family Medicine

## 2017-09-19 VITALS — BP 113/68 | HR 102 | Wt 220.0 lb

## 2017-09-19 DIAGNOSIS — Z3480 Encounter for supervision of other normal pregnancy, unspecified trimester: Secondary | ICD-10-CM

## 2017-09-19 NOTE — Progress Notes (Signed)
   PRENATAL VISIT NOTE  Subjective:  Erin Benjamin is a 27 y.o. G2P1001 at 377w4d being seen today for ongoing prenatal care.  She is currently monitored for the following issues for this low-risk pregnancy and has Supervision of other normal pregnancy, antepartum; Former cigarette smoker; Previous cesarean delivery affecting pregnancy, antepartum; and Bacterial vaginosis on their problem list.  Patient reports no complaints.  Contractions: Not present. Vag. Bleeding: None.  Movement: Present. Denies leaking of fluid.   The following portions of the patient's history were reviewed and updated as appropriate: allergies, current medications, past family history, past medical history, past social history, past surgical history and problem list. Problem list updated.  Objective:   Vitals:   09/19/17 1012  BP: 113/68  Pulse: (!) 102  Weight: 220 lb (99.8 kg)    Fetal Status: Fetal Heart Rate (bpm): 152 Fundal Height: 31 cm Movement: Present     General:  Alert, oriented and cooperative. Patient is in no acute distress.  Skin: Skin is warm and dry. No rash noted.   Cardiovascular: Normal heart rate noted  Respiratory: Normal respiratory effort, no problems with respiration noted  Abdomen: Soft, gravid, appropriate for gestational age.  Pain/Pressure: Present     Pelvic: Cervical exam deferred        Extremities: Normal range of motion.     Mental Status:  Normal mood and affect. Normal behavior. Normal judgment and thought content.   Assessment and Plan:  Pregnancy: G2P1001 at 8177w4d  1. Supervision of other normal pregnancy, antepartum -Reviewed results of gtt -- normal - CBC -- not collected at last visit -Patient requesting work not since she quit her job. Discussed with her that there is no medical reason to take her out of work at this time but we will provide a work restrictions note if she were to return that job or start a new job  Preterm labor symptoms and general obstetric  precautions including but not limited to vaginal bleeding, contractions, leaking of fluid and fetal movement were reviewed in detail with the patient. Please refer to After Visit Summary for other counseling recommendations.  Return in about 2 weeks (around 10/03/2017) for Routine OB.   Judeth HornErin Carnella Fryman, NP

## 2017-09-19 NOTE — Patient Instructions (Signed)
Third Trimester of Pregnancy The third trimester is from week 28 through week 40 (months 7 through 9). The third trimester is a time when the unborn baby (fetus) is growing rapidly. At the end of the ninth month, the fetus is about 20 inches in length and weighs 6-10 pounds. Body changes during your third trimester Your body will continue to go through many changes during pregnancy. The changes vary from woman to woman. During the third trimester:  Your weight will continue to increase. You can expect to gain 25-35 pounds (11-16 kg) by the end of the pregnancy.  You may begin to get stretch marks on your hips, abdomen, and breasts.  You may urinate more often because the fetus is moving lower into your pelvis and pressing on your bladder.  You may develop or continue to have heartburn. This is caused by increased hormones that slow down muscles in the digestive tract.  You may develop or continue to have constipation because increased hormones slow digestion and cause the muscles that push waste through your intestines to relax.  You may develop hemorrhoids. These are swollen veins (varicose veins) in the rectum that can itch or be painful.  You may develop swollen, bulging veins (varicose veins) in your legs.  You may have increased body aches in the pelvis, back, or thighs. This is due to weight gain and increased hormones that are relaxing your joints.  You may have changes in your hair. These can include thickening of your hair, rapid growth, and changes in texture. Some women also have hair loss during or after pregnancy, or hair that feels dry or thin. Your hair will most likely return to normal after your baby is born.  Your breasts will continue to grow and they will continue to become tender. A yellow fluid (colostrum) may leak from your breasts. This is the first milk you are producing for your baby.  Your belly button may stick out.  You may notice more swelling in your hands,  face, or ankles.  You may have increased tingling or numbness in your hands, arms, and legs. The skin on your belly may also feel numb.  You may feel short of breath because of your expanding uterus.  You may have more problems sleeping. This can be caused by the size of your belly, increased need to urinate, and an increase in your body's metabolism.  You may notice the fetus "dropping," or moving lower in your abdomen (lightening).  You may have increased vaginal discharge.  You may notice your joints feel loose and you may have pain around your pelvic bone.  What to expect at prenatal visits You will have prenatal exams every 2 weeks until week 36. Then you will have weekly prenatal exams. During a routine prenatal visit:  You will be weighed to make sure you and the baby are growing normally.  Your blood pressure will be taken.  Your abdomen will be measured to track your baby's growth.  The fetal heartbeat will be listened to.  Any test results from the previous visit will be discussed.  You may have a cervical check near your due date to see if your cervix has softened or thinned (effaced).  You will be tested for Group B streptococcus. This happens between 35 and 37 weeks.  Your health care provider may ask you:  What your birth plan is.  How you are feeling.  If you are feeling the baby move.  If you have had   any abnormal symptoms, such as leaking fluid, bleeding, severe headaches, or abdominal cramping.  If you are using any tobacco products, including cigarettes, chewing tobacco, and electronic cigarettes.  If you have any questions.  Other tests or screenings that may be performed during your third trimester include:  Blood tests that check for low iron levels (anemia).  Fetal testing to check the health, activity level, and growth of the fetus. Testing is done if you have certain medical conditions or if there are problems during the  pregnancy.  Nonstress test (NST). This test checks the health of your baby to make sure there are no signs of problems, such as the baby not getting enough oxygen. During this test, a belt is placed around your belly. The baby is made to move, and its heart rate is monitored during movement.  What is false labor? False labor is a condition in which you feel small, irregular tightenings of the muscles in the womb (contractions) that usually go away with rest, changing position, or drinking water. These are called Braxton Hicks contractions. Contractions may last for hours, days, or even weeks before true labor sets in. If contractions come at regular intervals, become more frequent, increase in intensity, or become painful, you should see your health care provider. What are the signs of labor?  Abdominal cramps.  Regular contractions that start at 10 minutes apart and become stronger and more frequent with time.  Contractions that start on the top of the uterus and spread down to the lower abdomen and back.  Increased pelvic pressure and dull back pain.  A watery or bloody mucus discharge that comes from the vagina.  Leaking of amniotic fluid. This is also known as your "water breaking." It could be a slow trickle or a gush. Let your health care provider know if it has a color or strange odor. If you have any of these signs, call your health care provider right away, even if it is before your due date. Follow these instructions at home: Medicines  Follow your health care provider's instructions regarding medicine use. Specific medicines may be either safe or unsafe to take during pregnancy.  Take a prenatal vitamin that contains at least 600 micrograms (mcg) of folic acid.  If you develop constipation, try taking a stool softener if your health care provider approves. Eating and drinking  Eat a balanced diet that includes fresh fruits and vegetables, whole grains, good sources of protein  such as meat, eggs, or tofu, and low-fat dairy. Your health care provider will help you determine the amount of weight gain that is right for you.  Avoid raw meat and uncooked cheese. These carry germs that can cause birth defects in the baby.  If you have low calcium intake from food, talk to your health care provider about whether you should take a daily calcium supplement.  Eat four or five small meals rather than three large meals a day.  Limit foods that are high in fat and processed sugars, such as fried and sweet foods.  To prevent constipation: ? Drink enough fluid to keep your urine clear or pale yellow. ? Eat foods that are high in fiber, such as fresh fruits and vegetables, whole grains, and beans. Activity  Exercise only as directed by your health care provider. Most women can continue their usual exercise routine during pregnancy. Try to exercise for 30 minutes at least 5 days a week. Stop exercising if you experience uterine contractions.  Avoid heavy   lifting.  Do not exercise in extreme heat or humidity, or at high altitudes.  Wear low-heel, comfortable shoes.  Practice good posture.  You may continue to have sex unless your health care provider tells you otherwise. Relieving pain and discomfort  Take frequent breaks and rest with your legs elevated if you have leg cramps or low back pain.  Take warm sitz baths to soothe any pain or discomfort caused by hemorrhoids. Use hemorrhoid cream if your health care provider approves.  Wear a good support bra to prevent discomfort from breast tenderness.  If you develop varicose veins: ? Wear support pantyhose or compression stockings as told by your healthcare provider. ? Elevate your feet for 15 minutes, 3-4 times a day. Prenatal care  Write down your questions. Take them to your prenatal visits.  Keep all your prenatal visits as told by your health care provider. This is important. Safety  Wear your seat belt at  all times when driving.  Make a list of emergency phone numbers, including numbers for family, friends, the hospital, and police and fire departments. General instructions  Avoid cat litter boxes and soil used by cats. These carry germs that can cause birth defects in the baby. If you have a cat, ask someone to clean the litter box for you.  Do not travel far distances unless it is absolutely necessary and only with the approval of your health care provider.  Do not use hot tubs, steam rooms, or saunas.  Do not drink alcohol.  Do not use any products that contain nicotine or tobacco, such as cigarettes and e-cigarettes. If you need help quitting, ask your health care provider.  Do not use any medicinal herbs or unprescribed drugs. These chemicals affect the formation and growth of the baby.  Do not douche or use tampons or scented sanitary pads.  Do not cross your legs for long periods of time.  To prepare for the arrival of your baby: ? Take prenatal classes to understand, practice, and ask questions about labor and delivery. ? Make a trial run to the hospital. ? Visit the hospital and tour the maternity area. ? Arrange for maternity or paternity leave through employers. ? Arrange for family and friends to take care of pets while you are in the hospital. ? Purchase a rear-facing car seat and make sure you know how to install it in your car. ? Pack your hospital bag. ? Prepare the baby's nursery. Make sure to remove all pillows and stuffed animals from the baby's crib to prevent suffocation.  Visit your dentist if you have not gone during your pregnancy. Use a soft toothbrush to brush your teeth and be gentle when you floss. Contact a health care provider if:  You are unsure if you are in labor or if your water has broken.  You become dizzy.  You have mild pelvic cramps, pelvic pressure, or nagging pain in your abdominal area.  You have lower back pain.  You have persistent  nausea, vomiting, or diarrhea.  You have an unusual or bad smelling vaginal discharge.  You have pain when you urinate. Get help right away if:  Your water breaks before 37 weeks.  You have regular contractions less than 5 minutes apart before 37 weeks.  You have a fever.  You are leaking fluid from your vagina.  You have spotting or bleeding from your vagina.  You have severe abdominal pain or cramping.  You have rapid weight loss or weight gain.    You have shortness of breath with chest pain.  You notice sudden or extreme swelling of your face, hands, ankles, feet, or legs.  Your baby makes fewer than 10 movements in 2 hours.  You have severe headaches that do not go away when you take medicine.  You have vision changes. Summary  The third trimester is from week 28 through week 40, months 7 through 9. The third trimester is a time when the unborn baby (fetus) is growing rapidly.  During the third trimester, your discomfort may increase as you and your baby continue to gain weight. You may have abdominal, leg, and back pain, sleeping problems, and an increased need to urinate.  During the third trimester your breasts will keep growing and they will continue to become tender. A yellow fluid (colostrum) may leak from your breasts. This is the first milk you are producing for your baby.  False labor is a condition in which you feel small, irregular tightenings of the muscles in the womb (contractions) that eventually go away. These are called Braxton Hicks contractions. Contractions may last for hours, days, or even weeks before true labor sets in.  Signs of labor can include: abdominal cramps; regular contractions that start at 10 minutes apart and become stronger and more frequent with time; watery or bloody mucus discharge that comes from the vagina; increased pelvic pressure and dull back pain; and leaking of amniotic fluid. This information is not intended to replace advice  given to you by your health care provider. Make sure you discuss any questions you have with your health care provider. Document Released: 07/26/2001 Document Revised: 01/07/2016 Document Reviewed: 10/02/2012 Elsevier Interactive Patient Education  2017 [employer]  

## 2017-09-19 NOTE — Progress Notes (Signed)
pt stated when standing for a while start getting dizzy and out of breath.

## 2017-09-20 LAB — CBC
HEMATOCRIT: 34.4 % (ref 34.0–46.6)
Hemoglobin: 11.8 g/dL (ref 11.1–15.9)
MCH: 29.4 pg (ref 26.6–33.0)
MCHC: 34.3 g/dL (ref 31.5–35.7)
MCV: 86 fL (ref 79–97)
Platelets: 243 10*3/uL (ref 150–379)
RBC: 4.01 x10E6/uL (ref 3.77–5.28)
RDW: 13.8 % (ref 12.3–15.4)
WBC: 11.1 10*3/uL — AB (ref 3.4–10.8)

## 2017-09-26 ENCOUNTER — Encounter: Payer: Self-pay | Admitting: Student

## 2017-10-04 ENCOUNTER — Encounter: Payer: Medicaid Other | Admitting: Student

## 2017-10-09 ENCOUNTER — Ambulatory Visit (INDEPENDENT_AMBULATORY_CARE_PROVIDER_SITE_OTHER): Payer: Medicaid Other | Admitting: Advanced Practice Midwife

## 2017-10-09 ENCOUNTER — Encounter: Payer: Self-pay | Admitting: Advanced Practice Midwife

## 2017-10-09 VITALS — BP 111/66 | HR 76 | Wt 230.4 lb

## 2017-10-09 DIAGNOSIS — Z3483 Encounter for supervision of other normal pregnancy, third trimester: Secondary | ICD-10-CM

## 2017-10-09 DIAGNOSIS — O34219 Maternal care for unspecified type scar from previous cesarean delivery: Secondary | ICD-10-CM

## 2017-10-09 DIAGNOSIS — Z3480 Encounter for supervision of other normal pregnancy, unspecified trimester: Secondary | ICD-10-CM

## 2017-10-09 NOTE — Progress Notes (Signed)
   PRENATAL VISIT NOTE  Subjective:  Erin Benjamin is a 27 y.o. G2P1001 at 8259w3d being seen today for ongoing prenatal care.  She is currently monitored for the following issues for this low-risk pregnancy and has Supervision of other normal pregnancy, antepartum; Former cigarette smoker; Previous cesarean delivery affecting pregnancy, antepartum; and Bacterial vaginosis on their problem list.  Patient reports no complaints.  Contractions: Irritability. Vag. Bleeding: None.  Movement: Present. Denies leaking of fluid.   The following portions of the patient's history were reviewed and updated as appropriate: allergies, current medications, past family history, past medical history, past social history, past surgical history and problem list. Problem list updated.  Objective:   Vitals:   10/09/17 0935  BP: 111/66  Pulse: 76  Weight: 230 lb 6.4 oz (104.5 kg)    Fetal Status: Fetal Heart Rate (bpm): 148 Fundal Height: 34 cm Movement: Present     General:  Alert, oriented and cooperative. Patient is in no acute distress.  Skin: Skin is warm and dry. No rash noted.   Cardiovascular: Normal heart rate noted  Respiratory: Normal respiratory effort, no problems with respiration noted  Abdomen: Soft, gravid, appropriate for gestational age.  Pain/Pressure: Present     Pelvic: Cervical exam deferred        Extremities: Normal range of motion.  Edema: Trace  Mental Status:  Normal mood and affect. Normal behavior. Normal judgment and thought content.   Assessment and Plan:  Pregnancy: G2P1001 at 2059w3d  1. Supervision of other normal pregnancy, antepartum - Routine care - GBS at next visit   2. Previous cesarean delivery affecting pregnancy, antepartum - Plans TOLAC, consent signed   Preterm labor symptoms and general obstetric precautions including but not limited to vaginal bleeding, contractions, leaking of fluid and fetal movement were reviewed in detail with the patient. Please  refer to After Visit Summary for other counseling recommendations.  Return in about 2 weeks (around 10/23/2017).   Thressa ShellerHeather Lanyah Spengler, CNM

## 2017-10-27 ENCOUNTER — Encounter: Payer: Medicaid Other | Admitting: Student

## 2017-11-03 ENCOUNTER — Other Ambulatory Visit (HOSPITAL_COMMUNITY)
Admission: RE | Admit: 2017-11-03 | Discharge: 2017-11-03 | Disposition: A | Discharge status: Home / Self Care | Payer: Medicaid Other | Source: Ambulatory Visit | Attending: Advanced Practice Midwife | Admitting: Advanced Practice Midwife

## 2017-11-03 ENCOUNTER — Ambulatory Visit (INDEPENDENT_AMBULATORY_CARE_PROVIDER_SITE_OTHER): Payer: Medicaid Other | Admitting: Advanced Practice Midwife

## 2017-11-03 VITALS — BP 110/72 | HR 82 | Wt 235.9 lb

## 2017-11-03 DIAGNOSIS — Z3480 Encounter for supervision of other normal pregnancy, unspecified trimester: Secondary | ICD-10-CM | POA: Diagnosis not present

## 2017-11-03 DIAGNOSIS — O34219 Maternal care for unspecified type scar from previous cesarean delivery: Secondary | ICD-10-CM

## 2017-11-03 LAB — OB RESULTS CONSOLE GC/CHLAMYDIA: Gonorrhea: NEGATIVE

## 2017-11-03 LAB — OB RESULTS CONSOLE GBS: GBS: NEGATIVE

## 2017-11-03 NOTE — Patient Instructions (Signed)
Reasons to return to MAU:  1.  Contractions are  5 minutes apart or less, each last 1 minute, these have been going on for 1-2 hours, and you cannot walk or talk during them 2.  You have a large gush of fluid, or a trickle of fluid that will not stop and you have to wear a pad 3.  You have bleeding that is bright red, heavier than spotting--like menstrual bleeding (spotting can be normal in early labor or after a check of your cervix) 4.  You do not feel the baby moving like he/she normally does   Try the [employer] ([employer]).

## 2017-11-03 NOTE — Progress Notes (Signed)
   PRENATAL VISIT NOTE  Subjective:  Erin Benjamin is a 27 y.o. G2P1001 at 2870w0d being seen today for ongoing prenatal care.  She is currently monitored for the following issues for this low-risk pregnancy and has Supervision of other normal pregnancy, antepartum; Former cigarette smoker; Previous cesarean delivery affecting pregnancy, antepartum; and Bacterial vaginosis on their problem list.  Patient reports no complaints.  Contractions: Not present. Vag. Bleeding: None.  Movement: Present. Denies leaking of fluid.   The following portions of the patient's history were reviewed and updated as appropriate: allergies, current medications, past family history, past medical history, past social history, past surgical history and problem list. Problem list updated.  Objective:   Vitals:   11/03/17 1056  BP: 110/72  Pulse: 82  Weight: 235 lb 14.4 oz (107 kg)    Fetal Status: Fetal Heart Rate (bpm): 147 Fundal Height: 39 cm Movement: Present     General:  Alert, oriented and cooperative. Patient is in no acute distress.  Skin: Skin is warm and dry. No rash noted.   Cardiovascular: Normal heart rate noted  Respiratory: Normal respiratory effort, no problems with respiration noted  Abdomen: Soft, gravid, appropriate for gestational age.  Pain/Pressure: Present     Pelvic: Cervical exam deferred        Extremities: Normal range of motion.  Edema: Trace  Mental Status:  Normal mood and affect. Normal behavior. Normal judgment and thought content.   Assessment and Plan:  Pregnancy: G2P1001 at 6170w0d  1. Supervision of other normal pregnancy, antepartum  - GC/Chlamydia probe amp ([place] - Culture, beta strep (group b only)  2. Previous cesarean delivery affecting pregnancy, antepartum --TOLAC consent signed  Term labor symptoms and general obstetric precautions including but not limited to vaginal bleeding, contractions, leaking of fluid and fetal movement were reviewed  in detail with the patient. Please refer to After Visit Summary for other counseling recommendations.  Return in about 1 week (around 11/10/2017).   Sharen CounterLisa Leftwich-Kirby, CNM

## 2017-11-06 LAB — GC/CHLAMYDIA PROBE AMP (~~LOC~~) NOT AT ARMC
Chlamydia: NEGATIVE
Neisseria Gonorrhea: NEGATIVE

## 2017-11-07 LAB — CULTURE, BETA STREP (GROUP B ONLY): Strep Gp B Culture: NEGATIVE

## 2017-11-09 ENCOUNTER — Ambulatory Visit (INDEPENDENT_AMBULATORY_CARE_PROVIDER_SITE_OTHER): Payer: Medicaid Other | Admitting: Student

## 2017-11-09 VITALS — BP 120/79 | HR 96 | Wt 239.8 lb

## 2017-11-09 DIAGNOSIS — Z3480 Encounter for supervision of other normal pregnancy, unspecified trimester: Secondary | ICD-10-CM

## 2017-11-09 NOTE — Progress Notes (Signed)
Starting Monday every 30 min off & on.No contractions today

## 2017-11-09 NOTE — Patient Instructions (Signed)
Before Baby Comes Home  Before your baby arrives it is important to:   Have all of the supplies that you will need to care for your baby.   Know where to go if there is an emergency.   Discuss the baby's arrival with other family members.    What supplies will I need?    It is recommended that you have the following supplies:  Large Items   Crib.   Crib mattress.   Rear-facing infant car seat. If possible, have a trained professional check to make sure that it is installed correctly.    Feeding   6-8 bottles that are 4-5 oz in size.   6-8 nipples.   Bottle brush.   Sterilizer, or a large pan or kettle with a lid.   A way to boil and cool water.   If you will be breastfeeding:  ? Breast pump.  ? Nipple cream.  ? Nursing bra.  ? Breast pads.  ? Breast shields.   If you will be formula feeding:  ? Formula.  ? Measuring cups.  ? Measuring spoons.    Bathing   Mild baby soap and baby shampoo.   Petroleum jelly.   Soft cloth towel and washcloth.   Hooded towel.   Cotton balls.   Bath basin.    Other Supplies   Rectal thermometer.   Bulb syringe.   Baby wipes or washcloths for diaper changes.   Diaper bag.   Changing pad.   Clothing, including one-piece outfits and pajamas.   Baby nail clippers.   Receiving blankets.   Mattress pad and sheets for the crib.   Night-light for the baby's room.   Baby monitor.   2 or 3 pacifiers.   Either 24-36 cloth diapers and waterproof diaper covers or a box of disposable diapers. You may need to use as many as 10-12 diapers per day.    How do I prepare for an emergency?  Prepare for an emergency by:   Knowing how to get to the nearest hospital.   Listing the phone numbers of your baby's health care providers near your home phone and in your cell phone.    How do I prepare my family?   Decide how to handle visitors.   If you have other children:  ? Talk with them about the baby coming home. Ask them how they feel about it.  ? Read a book together about  being a new big brother or sister.  ? Find ways to let them help you prepare for the new baby.  ? Have someone ready to care for them while you are in the hospital.  This information is not intended to replace advice given to you by your health care provider. Make sure you discuss any questions you have with your health care provider.  Document Released: 07/14/2008 Document Revised: 01/07/2016 Document Reviewed: 07/09/2014  Elsevier Interactive Patient Education  2018 Elsevier Inc[employer]

## 2017-11-09 NOTE — Progress Notes (Signed)
   PRENATAL VISIT NOTE  Subjective:  Erin Benjamin is a 27 y.o. G2P1001 at 3971w6d being seen today for ongoing prenatal care.  She is currently monitored for the following issues for this low-risk pregnancy and has Supervision of other normal pregnancy, antepartum; Former cigarette smoker; Previous cesarean delivery affecting pregnancy, antepartum; and Bacterial vaginosis on their problem list.  Patient reports occasional contractions.  Contractions: Irregular. Vag. Bleeding: None.  Movement: Present. Denies leaking of fluid.   The following portions of the patient's history were reviewed and updated as appropriate: allergies, current medications, past family history, past medical history, past social history, past surgical history and problem list. Problem list updated.  Objective:   Vitals:   11/09/17 1012  BP: 120/79  Pulse: 96  Weight: 239 lb 12.8 oz (108.8 kg)    Fetal Status: Fetal Heart Rate (bpm): 154 Fundal Height: 40 cm Movement: Present  Presentation: Vertex  General:  Alert, oriented and cooperative. Patient is in no acute distress.  Skin: Skin is warm and dry. No rash noted.   Cardiovascular: Normal heart rate noted  Respiratory: Normal respiratory effort, no problems with respiration noted  Abdomen: Soft, gravid, appropriate for gestational age.  Pain/Pressure: Present     Pelvic: Cervical exam performed Dilation: 1 Effacement (%): 50 Station: -3  Extremities: Normal range of motion.  Edema: Trace  Mental Status:  Normal mood and affect. Normal behavior. Normal judgment and thought content.   Assessment and Plan:  Pregnancy: G2P1001 at 7371w6d  1. Supervision of other normal pregnancy, antepartum -doing well -reviewed results, GBS & GC/CT negative  Term labor symptoms and general obstetric precautions including but not limited to vaginal bleeding, contractions, leaking of fluid and fetal movement were reviewed in detail with the patient. Please refer to After Visit  Summary for other counseling recommendations.  Return in about 1 week (around 11/16/2017) for Routine OB.   Judeth HornErin Miran Kautzman, NP

## 2017-11-15 ENCOUNTER — Telehealth (HOSPITAL_COMMUNITY): Payer: Self-pay | Admitting: *Deleted

## 2017-11-15 ENCOUNTER — Encounter (HOSPITAL_COMMUNITY): Payer: Self-pay | Admitting: *Deleted

## 2017-11-15 ENCOUNTER — Ambulatory Visit (INDEPENDENT_AMBULATORY_CARE_PROVIDER_SITE_OTHER): Payer: Medicaid Other | Admitting: Nurse Practitioner

## 2017-11-15 VITALS — BP 111/71 | HR 90 | Wt 241.8 lb

## 2017-11-15 DIAGNOSIS — Z3480 Encounter for supervision of other normal pregnancy, unspecified trimester: Secondary | ICD-10-CM

## 2017-11-15 NOTE — Progress Notes (Signed)
Addendum: ;scheduled for IOL.

## 2017-11-15 NOTE — Progress Notes (Signed)
    Subjective:  Erin Benjamin is a 27 y.o. G2P1001 at 5674w5d being seen today for ongoing prenatal care.  She is currently monitored for the following issues for this low-risk pregnancy and has Supervision of other normal pregnancy, antepartum; Former cigarette smoker; Previous cesarean delivery affecting pregnancy, antepartum; and Bacterial vaginosis on their problem list.  Patient reports contractions since last night. Thinking she is in labor today.   Contractions: Irregular. Vag. Bleeding: None.  Movement: Present. Denies leaking of fluid.   The following portions of the patient's history were reviewed and updated as appropriate: allergies, current medications, past family history, past medical history, past social history, past surgical history and problem list. Problem list updated.  Objective:   Vitals:   11/15/17 1025  BP: 111/71  Pulse: 90  Weight: 241 lb 12.8 oz (109.7 kg)    Fetal Status: Fetal Heart Rate (bpm): 140 Fundal Height: 41 cm Movement: Present  Presentation: Vertex  General:  Alert, oriented and cooperative. Patient is in no acute distress.  Skin: Skin is warm and dry. No rash noted.   Cardiovascular: Normal heart rate noted  Respiratory: Normal respiratory effort, no problems with respiration noted  Abdomen: Soft, gravid, appropriate for gestational age. Pain/Pressure: Present     Pelvic:  Cervical exam performed Dilation: Closed Effacement (%): 50 Station: -3  Extremities: Normal range of motion.  Edema: Trace  Mental Status: Normal mood and affect. Normal behavior. Normal judgment and thought content.   Urinalysis:      Assessment and Plan:  Pregnancy: G2P1001 at 8274w5d  1. Supervision of other normal pregnancy, antepartum and previous C/S Consult with Dr. Jolayne Pantheronstant - will see early next week to begin postdates testing.  Induction orders placed for 11-24-17.  Discussed foley bulb with Dr. Vergie LivingPickens (Dr. Jolayne Pantheronstant not available) and will not plan to have her come  the day before for a foley bulb as she is a previous C/S - can place at time of induction if needed.  Term labor symptoms and general obstetric precautions including but not limited to vaginal bleeding, contractions, leaking of fluid and fetal movement were reviewed in detail with the patient. Please refer to After Visit Summary for other counseling recommendations.  Return in about 5 days (around 11/20/2017).  Nolene BernheimERRI BURLESON, RN, MSN, NP-BC Nurse Practitioner, [facility], [facility] 11/15/2017 11:20 AM

## 2017-11-15 NOTE — Patient Instructions (Signed)
Braxton Hicks Contractions °Contractions of the uterus can occur throughout pregnancy, but they are not always a sign that you are in labor. You may have practice contractions called Braxton Hicks contractions. These false labor contractions are sometimes confused with true labor. °What are Braxton Hicks contractions? °Braxton Hicks contractions are tightening movements that occur in the muscles of the uterus before labor. Unlike true labor contractions, these contractions do not result in opening (dilation) and thinning of the cervix. Toward the end of pregnancy (32-34 weeks), Braxton Hicks contractions can happen more often and may become stronger. These contractions are sometimes difficult to tell apart from true labor because they can be very uncomfortable. You should not feel embarrassed if you go to the hospital with false labor. °Sometimes, the only way to tell if you are in true labor is for your health care provider to look for changes in the cervix. The health care provider will do a physical exam and may monitor your contractions. If you are not in true labor, the exam should show that your cervix is not dilating and your water has not broken. °If there are other health problems associated with your pregnancy, it is completely safe for you to be sent home with false labor. You may continue to have Braxton Hicks contractions until you go into true labor. °How to tell the difference between true labor and false labor °True labor °· Contractions last 30-70 seconds. °· Contractions become very regular. °· Discomfort is usually felt in the top of the uterus, and it spreads to the lower abdomen and low back. °· Contractions do not go away with walking. °· Contractions usually become more intense and increase in frequency. °· The cervix dilates and gets thinner. °False labor °· Contractions are usually shorter and not as strong as true labor contractions. °· Contractions are usually irregular. °· Contractions  are often felt in the front of the lower abdomen and in the groin. °· Contractions may go away when you walk around or change positions while lying down. °· Contractions get weaker and are shorter-lasting as time goes on. °· The cervix usually does not dilate or become thin. °Follow these instructions at home: °· Take over-the-counter and prescription medicines only as told by your health care provider. °· Keep up with your usual exercises and follow other instructions from your health care provider. °· Eat and drink lightly if you think you are going into labor. °· If Braxton Hicks contractions are making you uncomfortable: °? Change your position from lying down or resting to walking, or change from walking to resting. °? Sit and rest in a tub of warm water. °? Drink enough fluid to keep your urine pale yellow. Dehydration may cause these contractions. °? Do slow and deep breathing several times an hour. °· Keep all follow-up prenatal visits as told by your health care provider. This is important. °Contact a health care provider if: °· You have a fever. °· You have continuous pain in your abdomen. °Get help right away if: °· Your contractions become stronger, more regular, and closer together. °· You have fluid leaking or gushing from your vagina. °· You pass blood-tinged mucus (bloody show). °· You have bleeding from your vagina. °· You have low back pain that you never had before. °· You feel your baby’s head pushing down and causing pelvic pressure. °· Your baby is not moving inside you as much as it used to. °Summary °· Contractions that occur before labor are called Braxton   Hicks contractions, false labor, or practice contractions. °· Braxton Hicks contractions are usually shorter, weaker, farther apart, and less regular than true labor contractions. True labor contractions usually become progressively stronger and regular and they become more frequent. °· Manage discomfort from Braxton Hicks contractions by  changing position, resting in a warm bath, drinking plenty of water, or practicing deep breathing. °This information is not intended to replace advice given to you by your health care provider. Make sure you discuss any questions you have with your health care provider. °Document Released: 12/15/2016 Document Revised: 12/15/2016 Document Reviewed: 12/15/2016 °Elsevier Interactive Patient Education © 2018 Elsevier Inc. ° °

## 2017-11-15 NOTE — Telephone Encounter (Signed)
Preadmission screen  

## 2017-11-17 ENCOUNTER — Other Ambulatory Visit: Payer: Self-pay

## 2017-11-17 ENCOUNTER — Encounter (HOSPITAL_COMMUNITY): Payer: Self-pay | Admitting: *Deleted

## 2017-11-17 ENCOUNTER — Inpatient Hospital Stay (HOSPITAL_COMMUNITY)
Admission: AD | Admit: 2017-11-17 | Discharge: 2017-11-21 | DRG: 788 | Disposition: A | Discharge status: Home / Self Care | Payer: Medicaid Other | Source: Ambulatory Visit | Attending: Obstetrics and Gynecology | Admitting: Obstetrics and Gynecology

## 2017-11-17 DIAGNOSIS — O34211 Maternal care for low transverse scar from previous cesarean delivery: Secondary | ICD-10-CM | POA: Diagnosis present

## 2017-11-17 DIAGNOSIS — Z87891 Personal history of nicotine dependence: Secondary | ICD-10-CM

## 2017-11-17 DIAGNOSIS — O4292 Full-term premature rupture of membranes, unspecified as to length of time between rupture and onset of labor: Secondary | ICD-10-CM | POA: Diagnosis present

## 2017-11-17 DIAGNOSIS — O4290 Premature rupture of membranes, unspecified as to length of time between rupture and onset of labor, unspecified weeks of gestation: Secondary | ICD-10-CM | POA: Diagnosis present

## 2017-11-17 DIAGNOSIS — Z3A40 40 weeks gestation of pregnancy: Secondary | ICD-10-CM | POA: Diagnosis not present

## 2017-11-17 DIAGNOSIS — D649 Anemia, unspecified: Secondary | ICD-10-CM | POA: Diagnosis present

## 2017-11-17 DIAGNOSIS — O9902 Anemia complicating childbirth: Secondary | ICD-10-CM | POA: Diagnosis present

## 2017-11-17 DIAGNOSIS — Z3490 Encounter for supervision of normal pregnancy, unspecified, unspecified trimester: Secondary | ICD-10-CM | POA: Diagnosis present

## 2017-11-17 DIAGNOSIS — O4202 Full-term premature rupture of membranes, onset of labor within 24 hours of rupture: Secondary | ICD-10-CM | POA: Diagnosis not present

## 2017-11-17 DIAGNOSIS — O34219 Maternal care for unspecified type scar from previous cesarean delivery: Secondary | ICD-10-CM

## 2017-11-17 DIAGNOSIS — Z9889 Other specified postprocedural states: Secondary | ICD-10-CM

## 2017-11-17 LAB — CBC
HEMATOCRIT: 35.7 % — AB (ref 36.0–46.0)
Hemoglobin: 11.6 g/dL — ABNORMAL LOW (ref 12.0–15.0)
MCH: 28 pg (ref 26.0–34.0)
MCHC: 32.5 g/dL (ref 30.0–36.0)
MCV: 86.2 fL (ref 78.0–100.0)
Platelets: 234 10*3/uL (ref 150–400)
RBC: 4.14 MIL/uL (ref 3.87–5.11)
RDW: 14.8 % (ref 11.5–15.5)
WBC: 11.7 10*3/uL — AB (ref 4.0–10.5)

## 2017-11-17 LAB — TYPE AND SCREEN
ABO/RH(D): O POS
Antibody Screen: NEGATIVE

## 2017-11-17 LAB — POCT FERN TEST: POCT Fern Test: NEGATIVE

## 2017-11-17 MED ORDER — LACTATED RINGERS IV SOLN: 500.0000 mL | INTRAVENOUS | Status: DC | PRN

## 2017-11-17 MED ORDER — SOD CITRATE-CITRIC ACID 500-334 MG/5ML PO SOLN: 30.0000 mL | ORAL | Status: DC | PRN

## 2017-11-17 MED ORDER — LACTATED RINGERS IV SOLN
500.0000 mL | INTRAVENOUS | Status: DC | PRN
  Administered 2017-11-18 (×2): via INTRAVENOUS

## 2017-11-17 MED ORDER — TERBUTALINE SULFATE 1 MG/ML IJ SOLN: 0.2500 mg | Freq: Once | INTRAMUSCULAR | Status: DC | PRN

## 2017-11-17 MED ORDER — LIDOCAINE HCL (PF) 1 % IJ SOLN: 30.0000 mL | INTRAMUSCULAR | Status: DC | PRN

## 2017-11-17 MED ORDER — LACTATED RINGERS IV SOLN
INTRAVENOUS | Status: DC
  Administered 2017-11-17 – 2017-11-18 (×2): via INTRAVENOUS

## 2017-11-17 MED ORDER — OXYTOCIN 40 UNITS IN LACTATED RINGERS INFUSION - SIMPLE MED
2.5000 [IU]/h | INTRAVENOUS | Status: DC
  Filled 2017-11-17: qty 1000

## 2017-11-17 MED ORDER — OXYTOCIN 40 UNITS IN LACTATED RINGERS INFUSION - SIMPLE MED: 2.5000 [IU]/h | INTRAVENOUS | Status: DC

## 2017-11-17 MED ORDER — LACTATED RINGERS IV SOLN
INTRAVENOUS | Status: DC
  Administered 2017-11-18 (×2): via INTRAVENOUS

## 2017-11-17 MED ORDER — ONDANSETRON HCL 4 MG/2ML IJ SOLN: 4.0000 mg | Freq: Four times a day (QID) | INTRAMUSCULAR | Status: DC | PRN

## 2017-11-17 MED ORDER — OXYTOCIN BOLUS FROM INFUSION: 500.0000 mL | Freq: Once | INTRAVENOUS | Status: DC

## 2017-11-17 MED ORDER — ACETAMINOPHEN 325 MG PO TABS
650.0000 mg | ORAL_TABLET | ORAL | Status: DC | PRN
  Filled 2017-11-17: qty 2

## 2017-11-17 MED ORDER — SOD CITRATE-CITRIC ACID 500-334 MG/5ML PO SOLN
30.0000 mL | ORAL | Status: DC | PRN
  Filled 2017-11-17: qty 15

## 2017-11-17 MED ORDER — OXYCODONE-ACETAMINOPHEN 5-325 MG PO TABS: 1.0000 | ORAL_TABLET | ORAL | Status: DC | PRN

## 2017-11-17 MED ORDER — OXYTOCIN 40 UNITS IN LACTATED RINGERS INFUSION - SIMPLE MED
1.0000 m[IU]/min | INTRAVENOUS | Status: DC
  Administered 2017-11-17: 4 m[IU]/min via INTRAVENOUS
  Administered 2017-11-17: 2 m[IU]/min via INTRAVENOUS

## 2017-11-17 MED ORDER — ACETAMINOPHEN 325 MG PO TABS
650.0000 mg | ORAL_TABLET | ORAL | Status: DC | PRN
  Administered 2017-11-18: 650 mg via ORAL

## 2017-11-17 MED ORDER — OXYCODONE-ACETAMINOPHEN 5-325 MG PO TABS: 2.0000 | ORAL_TABLET | ORAL | Status: DC | PRN

## 2017-11-17 MED ORDER — FLEET ENEMA 7-19 GM/118ML RE ENEM: 1.0000 | ENEMA | RECTAL | Status: DC | PRN

## 2017-11-17 NOTE — MAU Provider Note (Addendum)
S: Ms. Erin Benjamin is a 27 y.o. G2P1001 at 4453w0d  who presents to MAU today complaining of leaking of fluid since Last night 4/5. She endorses vaginal bleeding. She endorses contractions. She reports normal fetal movement.    O: BP 110/84 (BP Location: Right Arm)   Pulse (!) 108   Temp 98.7 F (37.1 C) (Oral)   Resp 18   Wt 246 lb (111.6 kg)   LMP 01/28/2017   SpO2 100%   BMI 38.53 kg/m  GENERAL: Well-developed, well-nourished female in no acute distress.  HEAD: Normocephalic, atraumatic.  CHEST: Normal effort of breathing, regular heart rate ABDOMEN: Soft, nontender, gravid PELVIC: Normal external female genitalia. Vagina is pink and rugated. Cervix with normal contour, no lesions. Normal discharge.  + pooling.   Cervical exam:   Fetal Monitoring: Baseline: 140 bpm Variability: Moderate  Accelerations: 15x15 Decelerations: None Contractions: Minimal   Results for orders placed or performed during the hospital encounter of 11/17/17 (from the past 24 hour(s))  Fern Test     Status: Normal   Collection Time: 11/17/17  1:49 PM  Result Value Ref Range   POCT Fern Test Negative = intact amniotic membranes      A: SIUP at 5453w0d  SROM + fern slide   P: Admit to labor and delivery GBS negative  TOLAC desired    Erin Benjamin, Erin Hauswirth I, NP 11/17/2017 2:19 PM

## 2017-11-17 NOTE — MAU Note (Signed)
Had some ? Leakage around 1800 yesterday, small amt continue when she uses restroom.   Saw a small amt of blood last night.  Pains started yesterday, getting closer and stronger, every 5-6 min now.  Was 1 or 2 when last checked.  Has vomited and had loose stool today

## 2017-11-17 NOTE — H&P (Addendum)
Obstetric History and Physical  Erin Benjamin is a 27 y.o. G2P1001 with IUP at [redacted]w[redacted]d presenting for leaking of fluid. Patient states she has been having  irregular, every 10-15 minutes contractions, none vaginal bleeding, intact, ruptured membranes, with active fetal movement.    Prenatal Course Source of Care: [facility] with onset of care at 13 weeks Pregnancy complications or risks: Patient Active Problem List   Diagnosis Date Noted  . Bacterial vaginosis 06/15/2017  . Previous cesarean delivery affecting pregnancy, antepartum 05/29/2017  . Supervision of other normal pregnancy, antepartum 05/18/2017  . Former cigarette smoker 05/18/2017   She plans to breastfeed She desires [name] for postpartum contraception.   Prenatal labs and studies: ABO, Rh: --/--/O POS (04/05 1533) Antibody: NEG (04/05 1533) Rubella: 1.88 (10/04 1025) RPR: Non Reactive (10/04 1025)  HBsAg: Negative (10/04 1025)  HIV: Non Reactive (10/04 1025)  ZOX:WRUEAVWU (03/22 0000) 1 hr Glucola  66 Genetic screening normal Anatomy US normal  Prenatal Transfer Tool  Maternal Diabetes: No Genetic Screening: Normal Maternal Ultrasounds/Referrals: Normal Fetal Ultrasounds or other Referrals:  None Maternal Substance Abuse:  No Significant Maternal Medications:  None Significant Maternal Lab Results: None  Past Medical History:  Diagnosis Date  . Medical history non-contributory     Past Surgical History:  Procedure Laterality Date  . CESAREAN SECTION  08/2014    OB History  Gravida Para Term Preterm AB Living  2 1 1  0 0 1  SAB TAB Ectopic Multiple Live Births          1    # Outcome Date GA Lbr Len/2nd Weight Sex Delivery Anes PTL Lv  2 Current           1 Term 08/18/14 [redacted]w[redacted]d  8 lb 6 oz (3.799 kg) M CS-Unspec EPI  LIV     Birth Comments: no complications except c/s FTP    Social History   Socioeconomic History  . Marital status: Single    Spouse name: Not on file  . Number of children: Not on  file  . Years of education: Not on file  . Highest education level: Not on file  Occupational History  . Not on file  Social Needs  . Financial resource strain: Not on file  . Food insecurity:    Worry: Not on file    Inability: Not on file  . Transportation needs:    Medical: Not on file    Non-medical: Not on file  Tobacco Use  . Smoking status: Former Smoker    Packs/day: 1.00    Types: Cigarettes    Last attempt to quit: 02/20/2017    Years since quitting: 0.7  . Smokeless tobacco: Never Used  . Tobacco comment: stopped with pregnancy  Substance and Sexual Activity  . Alcohol use: Yes    Comment: stopped when found out pregnant  . Drug use: Yes    Types: Marijuana    Comment: stopped 02/2017  . Sexual activity: Yes    Birth control/protection: None  Lifestyle  . Physical activity:    Days per week: Not on file    Minutes per session: Not on file  . Stress: Not on file  Relationships  . Social connections:    Talks on phone: Not on file    Gets together: Not on file    Attends religious service: Not on file    Active member of club or organization: Not on file    Attends meetings of clubs or organizations: Not on  file    Relationship status: Not on file  Other Topics Concern  . Not on file  Social History Narrative  . Not on file    Family History  Problem Relation Age of Onset  . Diabetes Mother     Medications Prior to Admission  Medication Sig Dispense Refill Last Dose  . Prenatal Multivit-Min-Fe-FA (PRENATAL VITAMINS) 0.8 MG tablet Take 1 tablet by mouth daily. (Patient not taking: Reported on 11/17/2017) 30 tablet 12 Not Taking at Unknown time  . terconazole (TERAZOL 7) 0.4 % vaginal cream Place 1 applicator vaginally at bedtime. (Patient not taking: Reported on 10/09/2017) 45 g 0 Not Taking    No Known Allergies  Review of Systems: Negative except for what is mentioned in HPI.  Physical Exam: BP 110/84 (BP Location: Right Arm)   Pulse (!) 108    Temp 98.7 F (37.1 C) (Oral)   Resp 18   Wt 246 lb (111.6 kg)   LMP 01/28/2017   SpO2 100%   BMI 38.53 kg/m  CONSTITUTIONAL: Well-developed, well-nourished female in no acute distress.  HENT:  Normocephalic, atraumatic, External right and left ear normal. Oropharynx is clear and moist EYES: Conjunctivae and EOM are normal. Pupils are equal, round, and reactive to light. No scleral icterus.  NECK: Normal range of motion, supple, no masses SKIN: Skin is warm and dry. No rash noted. Not diaphoretic. No erythema. No pallor. NEUROLOGIC: Alert and oriented to person, place, and time. Normal reflexes, muscle tone coordination. No cranial nerve deficit noted. PSYCHIATRIC: Normal mood and affect. Normal behavior. Normal judgment and thought content. CARDIOVASCULAR: Normal heart rate noted, regular rhythm RESPIRATORY: Effort and breath sounds normal, no problems with respiration noted ABDOMEN: Soft, nontender, nondistended, gravid. MUSCULOSKELETAL: Normal range of motion. No edema and no tenderness. 2+ distal pulses.  Cervical Exam: Dilatation 1 cm   Effacement 70%   Station -2 Foley bulb inserted; 60 cc instilled with no difficulty by RN, patient tolerated procedure well.  Presentation: cephalic, confirmed by bedside US FHT:  Baseline rate 140 bpm   Variability moderate  Accelerations present   Decelerations none Contractions: Minimal    Pertinent Labs/Studies:   Results for orders placed or performed during the hospital encounter of 11/17/17 (from the past 24 hour(s))  Fern Test     Status: Normal   Collection Time: 11/17/17  1:49 PM  Result Value Ref Range   POCT Fern Test Negative = intact amniotic membranes   CBC     Status: Abnormal   Collection Time: 11/17/17  3:33 PM  Result Value Ref Range   WBC 11.7 (H) 4.0 - 10.5 K/uL   RBC 4.14 3.87 - 5.11 MIL/uL   Hemoglobin 11.6 (L) 12.0 - 15.0 g/dL   HCT 16.1 (L) 09.6 - 04.5 %   MCV 86.2 78.0 - 100.0 fL   MCH 28.0 26.0 - 34.0 pg   MCHC  32.5 30.0 - 36.0 g/dL   RDW 40.9 81.1 - 91.4 %   Platelets 234 150 - 400 K/uL  Type and screen [facility]     Status: None   Collection Time: 11/17/17  3:33 PM  Result Value Ref Range   ABO/RH(D) O POS    Antibody Screen NEG    Sample Expiration      11/20/2017 Performed at [facility], [address] [place], [place] [address]     Assessment : Erin Benjamin is a 27 y.o. G2P1001 at [redacted]w[redacted]d being admitted for induction of labor  due to PROM.  + FERN test today.  Plan: Labor:  Induction/Augmentation as ordered as per protocol. Analgesia as needed. FWB: Reassuring fetal heart tracing.  GBS negative Delivery plan: Hopeful for vaginal delivery  Rasch, Harolyn RutherfordJennifer I, NP 11/17/2017 4:40 PM  [facility]

## 2017-11-17 NOTE — Progress Notes (Signed)
LABOR PROGRESS NOTE  Subjective:  Patient seen and examined for progress of labor. Patient comfortable with epidural feeling contractions well.   Objective:  Vitals:   11/17/17 2000 11/17/17 2030 11/17/17 2100 11/17/17 2200  BP: (!) 107/93 125/85 107/88 105/71  Pulse: 74 99 80 76  Resp:  18    Temp:      TempSrc:      SpO2:      Weight:      Height:       Dilation: 3.5 Effacement (%): 70 Cervical Position: Posterior Station: -3 Presentation: Vertex Exam by:: Rolene Arbouresmaine Lewis, RN FHT: 150 bpm, moderate variability, accelerations present, absent decelerations TOCO: irregular, every 3-5 minutes  Assessment/Plan: Erin Benjamin is a 27 y.o. G2P1001 at 8173w0d being admitted for induction of labor due to PROM.  Labor: stage 1 Preeclampsia: N/A Fetal wellbeing: category 1 Pain control: epidural I/D: neg Anticipated MOD: continue expectant management, anticipate SVD  Durward Parcelavid McMullen, DO, PGY-2 11/17/2017, 10:38 PM

## 2017-11-17 NOTE — Anesthesia Pain Management Evaluation Note (Signed)
  CRNA Pain Management Visit Note  Patient: Erin Benjamin, 27 y.o., female  "Hello [facility] am a [facility]. We have an anesthesia team available at all times to provide care throughout the hospital, including epidural management and anesthesia for C-section. I don't know your plan for the delivery whether it a natural birth, water birth, IV sedation, nitrous supplementation, doula or epidural, but we want to meet your pain goals."   1.Was your pain managed to your expectations on prior hospitalizations?   Yes   2.What is your expectation for pain management during this hospitalization?     Labor support without medications, Epidural and IV pain meds  3.How can we help you reach that goal? Desires natural, but will consider pain management options  Record the patient's initial score and the patient's pain goal.   Pain: 8  Pain Goal: 9 [facility] wants you to be able to say your pain was always managed very well.  [facility] CenterMERRITT,Kymoni Monday 11/17/2017

## 2017-11-18 ENCOUNTER — Inpatient Hospital Stay (HOSPITAL_COMMUNITY): Payer: Medicaid Other | Admitting: Anesthesiology

## 2017-11-18 ENCOUNTER — Encounter (HOSPITAL_COMMUNITY)
Admission: AD | Disposition: A | Discharge status: Home / Self Care | Payer: Self-pay | Source: Ambulatory Visit | Attending: Obstetrics and Gynecology

## 2017-11-18 ENCOUNTER — Encounter (HOSPITAL_COMMUNITY): Payer: Self-pay | Admitting: *Deleted

## 2017-11-18 DIAGNOSIS — Z3A40 40 weeks gestation of pregnancy: Secondary | ICD-10-CM

## 2017-11-18 DIAGNOSIS — Z9889 Other specified postprocedural states: Secondary | ICD-10-CM

## 2017-11-18 DIAGNOSIS — O34211 Maternal care for low transverse scar from previous cesarean delivery: Secondary | ICD-10-CM

## 2017-11-18 DIAGNOSIS — O4202 Full-term premature rupture of membranes, onset of labor within 24 hours of rupture: Secondary | ICD-10-CM

## 2017-11-18 HISTORY — DX: Other specified postprocedural states: Z98.890

## 2017-11-18 LAB — RPR: RPR: NONREACTIVE

## 2017-11-18 SURGERY — Surgical Case
Anesthesia: Epidural

## 2017-11-18 MED ORDER — NALBUPHINE HCL 10 MG/ML IJ SOLN
5.0000 mg | INTRAMUSCULAR | Status: DC | PRN
  Filled 2017-11-18: qty 1

## 2017-11-18 MED ORDER — PROMETHAZINE HCL 25 MG/ML IJ SOLN: 6.2500 mg | INTRAMUSCULAR | Status: DC | PRN

## 2017-11-18 MED ORDER — DIPHENHYDRAMINE HCL 50 MG/ML IJ SOLN: 12.5000 mg | INTRAMUSCULAR | Status: DC | PRN

## 2017-11-18 MED ORDER — OXYCODONE-ACETAMINOPHEN 5-325 MG PO TABS
2.0000 | ORAL_TABLET | ORAL | Status: DC | PRN
  Administered 2017-11-20 – 2017-11-21 (×4): 2 via ORAL
  Filled 2017-11-18 (×3): qty 2

## 2017-11-18 MED ORDER — COCONUT OIL OIL
1.0000 "application " | TOPICAL_OIL | Status: DC | PRN
  Filled 2017-11-18: qty 120

## 2017-11-18 MED ORDER — EPHEDRINE 5 MG/ML INJ: 10.0000 mg | INTRAVENOUS | Status: DC | PRN

## 2017-11-18 MED ORDER — DIBUCAINE 1 % RE OINT
1.0000 "application " | TOPICAL_OINTMENT | RECTAL | Status: DC | PRN
  Filled 2017-11-18: qty 28

## 2017-11-18 MED ORDER — KETOROLAC TROMETHAMINE 30 MG/ML IJ SOLN
INTRAMUSCULAR | Status: DC | PRN
  Administered 2017-11-18: 30 mg via INTRAVENOUS

## 2017-11-18 MED ORDER — KETOROLAC TROMETHAMINE 30 MG/ML IJ SOLN: 30.0000 mg | Freq: Four times a day (QID) | INTRAMUSCULAR | Status: AC | PRN

## 2017-11-18 MED ORDER — MEPERIDINE HCL 25 MG/ML IJ SOLN: 6.2500 mg | INTRAMUSCULAR | Status: DC | PRN

## 2017-11-18 MED ORDER — SODIUM CHLORIDE 0.9 % IV SOLN: 500.0000 mg | INTRAVENOUS | Status: DC

## 2017-11-18 MED ORDER — LIDOCAINE HCL (PF) 1 % IJ SOLN
INTRAMUSCULAR | Status: DC | PRN
  Administered 2017-11-18 (×2): 5 mL via EPIDURAL

## 2017-11-18 MED ORDER — ONDANSETRON HCL 4 MG/2ML IJ SOLN: 4.0000 mg | Freq: Three times a day (TID) | INTRAMUSCULAR | Status: DC | PRN

## 2017-11-18 MED ORDER — FENTANYL CITRATE (PF) 100 MCG/2ML IJ SOLN
INTRAMUSCULAR | Status: AC
  Filled 2017-11-18: qty 2

## 2017-11-18 MED ORDER — HYDROMORPHONE HCL 1 MG/ML IJ SOLN: 0.2500 mg | INTRAMUSCULAR | Status: DC | PRN

## 2017-11-18 MED ORDER — NALBUPHINE HCL 10 MG/ML IJ SOLN
5.0000 mg | Freq: Once | INTRAMUSCULAR | Status: AC | PRN
  Administered 2017-11-18: 5 mg via INTRAVENOUS

## 2017-11-18 MED ORDER — OXYTOCIN 10 UNIT/ML IJ SOLN
INTRAMUSCULAR | Status: AC
  Filled 2017-11-18: qty 1

## 2017-11-18 MED ORDER — OXYTOCIN 10 UNIT/ML IJ SOLN
INTRAMUSCULAR | Status: AC
  Filled 2017-11-18: qty 4

## 2017-11-18 MED ORDER — ONDANSETRON HCL 4 MG/2ML IJ SOLN
INTRAMUSCULAR | Status: AC
  Filled 2017-11-18: qty 4

## 2017-11-18 MED ORDER — SODIUM CHLORIDE 0.9 % IV SOLN
2.0000 g | Freq: Four times a day (QID) | INTRAVENOUS | Status: DC
  Administered 2017-11-18 (×2): 2 g via INTRAVENOUS
  Filled 2017-11-18 (×2): qty 2
  Filled 2017-11-18: qty 2000

## 2017-11-18 MED ORDER — NALOXONE HCL 0.4 MG/ML IJ SOLN: 0.4000 mg | INTRAMUSCULAR | Status: DC | PRN

## 2017-11-18 MED ORDER — ACETAMINOPHEN 500 MG PO TABS
1000.0000 mg | ORAL_TABLET | Freq: Four times a day (QID) | ORAL | Status: AC
  Administered 2017-11-19 (×2): 1000 mg via ORAL
  Filled 2017-11-18 (×2): qty 2

## 2017-11-18 MED ORDER — DIPHENHYDRAMINE HCL 25 MG PO CAPS
25.0000 mg | ORAL_CAPSULE | Freq: Four times a day (QID) | ORAL | Status: DC | PRN
  Filled 2017-11-18: qty 1

## 2017-11-18 MED ORDER — SIMETHICONE 80 MG PO CHEW
80.0000 mg | CHEWABLE_TABLET | Freq: Three times a day (TID) | ORAL | Status: DC
  Administered 2017-11-19 – 2017-11-20 (×5): 80 mg via ORAL
  Filled 2017-11-18 (×9): qty 1

## 2017-11-18 MED ORDER — SIMETHICONE 80 MG PO CHEW
80.0000 mg | CHEWABLE_TABLET | ORAL | Status: DC
  Administered 2017-11-19 – 2017-11-21 (×2): 80 mg via ORAL
  Filled 2017-11-18 (×4): qty 1

## 2017-11-18 MED ORDER — SIMETHICONE 80 MG PO CHEW
80.0000 mg | CHEWABLE_TABLET | ORAL | Status: DC | PRN
  Filled 2017-11-18: qty 1

## 2017-11-18 MED ORDER — SCOPOLAMINE 1 MG/3DAYS TD PT72: 1.0000 | MEDICATED_PATCH | Freq: Once | TRANSDERMAL | Status: DC

## 2017-11-18 MED ORDER — KETOROLAC TROMETHAMINE 30 MG/ML IJ SOLN: 30.0000 mg | Freq: Once | INTRAMUSCULAR | Status: DC | PRN

## 2017-11-18 MED ORDER — MEPERIDINE HCL 25 MG/ML IJ SOLN
INTRAMUSCULAR | Status: DC | PRN
  Administered 2017-11-18 (×2): 12.5 mg via INTRAVENOUS

## 2017-11-18 MED ORDER — AZITHROMYCIN 500 MG IV SOLR
500.0000 mg | INTRAVENOUS | Status: AC
  Administered 2017-11-18: 500 mg via INTRAVENOUS
  Filled 2017-11-18: qty 500

## 2017-11-18 MED ORDER — DEXAMETHASONE SODIUM PHOSPHATE 4 MG/ML IJ SOLN
INTRAMUSCULAR | Status: DC | PRN
  Administered 2017-11-18: 4 mg via INTRAVENOUS

## 2017-11-18 MED ORDER — OXYTOCIN 10 UNIT/ML IJ SOLN
INTRAVENOUS | Status: DC | PRN
  Administered 2017-11-18: 40 [IU] via INTRAVENOUS

## 2017-11-18 MED ORDER — KETOROLAC TROMETHAMINE 30 MG/ML IJ SOLN
INTRAMUSCULAR | Status: AC
  Filled 2017-11-18: qty 1

## 2017-11-18 MED ORDER — MENTHOL 3 MG MT LOZG
1.0000 | LOZENGE | OROMUCOSAL | Status: DC | PRN
  Filled 2017-11-18: qty 9

## 2017-11-18 MED ORDER — SODIUM CHLORIDE 0.9 % IR SOLN
Status: DC | PRN
  Administered 2017-11-18: 1000 mL

## 2017-11-18 MED ORDER — SCOPOLAMINE 1 MG/3DAYS TD PT72
MEDICATED_PATCH | TRANSDERMAL | Status: DC | PRN
  Administered 2017-11-18: 1 via TRANSDERMAL

## 2017-11-18 MED ORDER — FENTANYL 2.5 MCG/ML BUPIVACAINE 1/10 % EPIDURAL INFUSION (WH - ANES)
14.0000 mL/h | INTRAMUSCULAR | Status: DC | PRN
  Administered 2017-11-18 (×2): 14 mL/h via EPIDURAL
  Filled 2017-11-18 (×2): qty 100

## 2017-11-18 MED ORDER — OXYTOCIN 40 UNITS IN LACTATED RINGERS INFUSION - SIMPLE MED: 2.5000 [IU]/h | INTRAVENOUS | Status: AC

## 2017-11-18 MED ORDER — WITCH HAZEL-GLYCERIN EX PADS: 1.0000 "application " | MEDICATED_PAD | CUTANEOUS | Status: DC | PRN

## 2017-11-18 MED ORDER — OXYCODONE-ACETAMINOPHEN 5-325 MG PO TABS
1.0000 | ORAL_TABLET | ORAL | Status: DC | PRN
  Administered 2017-11-19: 1 via ORAL
  Filled 2017-11-18 (×2): qty 1

## 2017-11-18 MED ORDER — LACTATED RINGERS IV SOLN: 500.0000 mL | Freq: Once | INTRAVENOUS | Status: DC

## 2017-11-18 MED ORDER — FENTANYL CITRATE (PF) 100 MCG/2ML IJ SOLN
INTRAMUSCULAR | Status: DC | PRN
  Administered 2017-11-18: 100 ug via EPIDURAL

## 2017-11-18 MED ORDER — IBUPROFEN 800 MG PO TABS
800.0000 mg | ORAL_TABLET | Freq: Three times a day (TID) | ORAL | Status: DC
  Administered 2017-11-19 – 2017-11-21 (×8): 800 mg via ORAL
  Filled 2017-11-18 (×10): qty 1

## 2017-11-18 MED ORDER — SENNOSIDES-DOCUSATE SODIUM 8.6-50 MG PO TABS
2.0000 | ORAL_TABLET | ORAL | Status: DC
  Administered 2017-11-19 – 2017-11-21 (×2): 2 via ORAL
  Filled 2017-11-18 (×5): qty 2

## 2017-11-18 MED ORDER — NALBUPHINE HCL 10 MG/ML IJ SOLN
5.0000 mg | INTRAMUSCULAR | Status: DC | PRN
  Administered 2017-11-19: 5 mg via INTRAVENOUS
  Filled 2017-11-18: qty 1

## 2017-11-18 MED ORDER — NALBUPHINE HCL 10 MG/ML IJ SOLN: 5.0000 mg | Freq: Once | INTRAMUSCULAR | Status: AC | PRN

## 2017-11-18 MED ORDER — KETOROLAC TROMETHAMINE 30 MG/ML IJ SOLN: 30.0000 mg | Freq: Four times a day (QID) | INTRAMUSCULAR | Status: AC

## 2017-11-18 MED ORDER — TETANUS-DIPHTH-ACELL PERTUSSIS 5-2.5-18.5 LF-MCG/0.5 IM SUSP
0.5000 mL | Freq: Once | INTRAMUSCULAR | Status: DC
  Filled 2017-11-18: qty 0.5

## 2017-11-18 MED ORDER — MORPHINE SULFATE (PF) 0.5 MG/ML IJ SOLN
INTRAMUSCULAR | Status: AC
  Filled 2017-11-18: qty 10

## 2017-11-18 MED ORDER — PRENATAL MULTIVITAMIN CH
1.0000 | ORAL_TABLET | Freq: Every day | ORAL | Status: DC
  Administered 2017-11-19 – 2017-11-20 (×2): 1 via ORAL
  Filled 2017-11-18 (×4): qty 1

## 2017-11-18 MED ORDER — PHENYLEPHRINE 40 MCG/ML (10ML) SYRINGE FOR IV PUSH (FOR BLOOD PRESSURE SUPPORT)
80.0000 ug | PREFILLED_SYRINGE | INTRAVENOUS | Status: DC | PRN
  Filled 2017-11-18: qty 10

## 2017-11-18 MED ORDER — SODIUM CHLORIDE 0.9% FLUSH: 3.0000 mL | INTRAVENOUS | Status: DC | PRN

## 2017-11-18 MED ORDER — DIPHENHYDRAMINE HCL 25 MG PO CAPS
25.0000 mg | ORAL_CAPSULE | ORAL | Status: DC | PRN
  Administered 2017-11-19: 25 mg via ORAL
  Filled 2017-11-18 (×2): qty 1

## 2017-11-18 MED ORDER — GENTAMICIN SULFATE 40 MG/ML IJ SOLN
180.0000 mg | Freq: Three times a day (TID) | INTRAVENOUS | Status: DC
  Administered 2017-11-18: 180 mg via INTRAVENOUS
  Filled 2017-11-18 (×3): qty 4.5

## 2017-11-18 MED ORDER — ZOLPIDEM TARTRATE 5 MG PO TABS: 5.0000 mg | ORAL_TABLET | Freq: Every evening | ORAL | Status: DC | PRN

## 2017-11-18 MED ORDER — PHENYLEPHRINE 40 MCG/ML (10ML) SYRINGE FOR IV PUSH (FOR BLOOD PRESSURE SUPPORT): 80.0000 ug | PREFILLED_SYRINGE | INTRAVENOUS | Status: DC | PRN

## 2017-11-18 MED ORDER — MORPHINE SULFATE (PF) 0.5 MG/ML IJ SOLN
INTRAMUSCULAR | Status: DC | PRN
  Administered 2017-11-18: 4 mg via EPIDURAL
  Administered 2017-11-18: 1 mg via INTRAVENOUS

## 2017-11-18 MED ORDER — LACTATED RINGERS IV SOLN
INTRAVENOUS | Status: DC
  Administered 2017-11-19 (×3): via INTRAVENOUS

## 2017-11-18 MED ORDER — SODIUM BICARBONATE 8.4 % IV SOLN
INTRAVENOUS | Status: DC | PRN
  Administered 2017-11-18 (×2): 5 mL via EPIDURAL
  Administered 2017-11-18: 4 mL via EPIDURAL
  Administered 2017-11-18: 5 mL via EPIDURAL

## 2017-11-18 MED ORDER — ACETAMINOPHEN 325 MG PO TABS: 650.0000 mg | ORAL_TABLET | ORAL | Status: DC | PRN

## 2017-11-18 MED ORDER — ONDANSETRON HCL 4 MG/2ML IJ SOLN
INTRAMUSCULAR | Status: DC | PRN
  Administered 2017-11-18: 4 mg via INTRAVENOUS

## 2017-11-18 MED ORDER — LIDOCAINE-EPINEPHRINE (PF) 2 %-1:200000 IJ SOLN
INTRAMUSCULAR | Status: AC
  Filled 2017-11-18: qty 20

## 2017-11-18 MED ORDER — MEPERIDINE HCL 25 MG/ML IJ SOLN
INTRAMUSCULAR | Status: AC
  Filled 2017-11-18: qty 1

## 2017-11-18 MED ORDER — NALOXONE HCL 4 MG/10ML IJ SOLN
1.0000 ug/kg/h | INTRAVENOUS | Status: DC | PRN
  Filled 2017-11-18: qty 5

## 2017-11-18 MED ORDER — SOD CITRATE-CITRIC ACID 500-334 MG/5ML PO SOLN
30.0000 mL | Freq: Once | ORAL | Status: AC
  Administered 2017-11-18: 30 mL via ORAL

## 2017-11-18 MED ORDER — ENOXAPARIN SODIUM 60 MG/0.6ML ~~LOC~~ SOLN
60.0000 mg | SUBCUTANEOUS | Status: DC
  Administered 2017-11-19 – 2017-11-21 (×3): 60 mg via SUBCUTANEOUS
  Filled 2017-11-18 (×4): qty 0.6

## 2017-11-18 SURGICAL SUPPLY — 47 items
BENZOIN TINCTURE PRP APPL 2/3 (GAUZE/BANDAGES/DRESSINGS) ×3 IMPLANT
CHLORAPREP W/TINT 26ML (MISCELLANEOUS) ×3 IMPLANT
CLAMP CORD UMBIL (MISCELLANEOUS) IMPLANT
CLOSURE STERI-STRIP 1/2X4 (GAUZE/BANDAGES/DRESSINGS) ×1
CLOSURE WOUND 1/2 X4 (GAUZE/BANDAGES/DRESSINGS) ×1
CLOTH BEACON ORANGE TIMEOUT ST (SAFETY) ×3 IMPLANT
CLSR STERI-STRIP ANTIMIC 1/2X4 (GAUZE/BANDAGES/DRESSINGS) ×2 IMPLANT
DRAPE C SECTION CLR SCREEN (DRAPES) IMPLANT
DRSG OPSITE POSTOP 4X10 (GAUZE/BANDAGES/DRESSINGS) ×3 IMPLANT
DRSG PAD ABDOMINAL 8X10 ST (GAUZE/BANDAGES/DRESSINGS) ×3 IMPLANT
ELECT REM PT RETURN 9FT ADLT (ELECTROSURGICAL) ×3
ELECTRODE REM PT RTRN 9FT ADLT (ELECTROSURGICAL) ×1 IMPLANT
EXTRACTOR VACUUM M CUP 4 TUBE (SUCTIONS) IMPLANT
EXTRACTOR VACUUM M CUP 4' TUBE (SUCTIONS)
GLOVE BIO SURGEON STRL SZ7.5 (GLOVE) ×3 IMPLANT
GLOVE BIOGEL PI IND STRL 7.0 (GLOVE) ×1 IMPLANT
GLOVE BIOGEL PI INDICATOR 7.0 (GLOVE) ×2
GOWN STRL REUS W/TWL 2XL LVL3 (GOWN DISPOSABLE) ×3 IMPLANT
GOWN STRL REUS W/TWL LRG LVL3 (GOWN DISPOSABLE) ×6 IMPLANT
HOVERMATT SINGLE USE (MISCELLANEOUS) ×3 IMPLANT
KIT ABG SYR 3ML LUER SLIP (SYRINGE) IMPLANT
NEEDLE HYPO 22GX1.5 SAFETY (NEEDLE) ×3 IMPLANT
NEEDLE HYPO 25X5/8 SAFETYGLIDE (NEEDLE) IMPLANT
NS IRRIG 1000ML POUR BTL (IV SOLUTION) ×3 IMPLANT
PACK C SECTION WH (CUSTOM PROCEDURE TRAY) ×3 IMPLANT
PAD OB MATERNITY 4.3X12.25 (PERSONAL CARE ITEMS) ×3 IMPLANT
PENCIL SMOKE EVAC W/HOLSTER (ELECTROSURGICAL) ×3 IMPLANT
RETRACTOR TRAXI PANNICULUS (MISCELLANEOUS) ×1 IMPLANT
RTRCTR C-SECT PINK 25CM LRG (MISCELLANEOUS) ×3 IMPLANT
SPONGE GAUZE 4X4 12PLY STER LF (GAUZE/BANDAGES/DRESSINGS) ×6 IMPLANT
STRIP CLOSURE SKIN 1/2X4 (GAUZE/BANDAGES/DRESSINGS) ×2 IMPLANT
SUT CHROMIC 1 CTX 36 (SUTURE) ×6 IMPLANT
SUT VIC AB 1 CT1 27 (SUTURE) ×4
SUT VIC AB 1 CT1 27XBRD ANTBC (SUTURE) ×2 IMPLANT
SUT VIC AB 2-0 CT1 (SUTURE) ×3 IMPLANT
SUT VIC AB 2-0 CT1 27 (SUTURE) ×2
SUT VIC AB 2-0 CT1 TAPERPNT 27 (SUTURE) ×1 IMPLANT
SUT VIC AB 3-0 CT1 27 (SUTURE) ×4
SUT VIC AB 3-0 CT1 TAPERPNT 27 (SUTURE) ×2 IMPLANT
SUT VIC AB 3-0 SH 27 (SUTURE)
SUT VIC AB 3-0 SH 27X BRD (SUTURE) IMPLANT
SUT VIC AB 4-0 KS 27 (SUTURE) ×3 IMPLANT
SYR BULB IRRIGATION 50ML (SYRINGE) IMPLANT
TAPE CLOTH SURG 4X10 WHT LF (GAUZE/BANDAGES/DRESSINGS) ×3 IMPLANT
TOWEL OR 17X24 6PK STRL BLUE (TOWEL DISPOSABLE) ×3 IMPLANT
TRAXI PANNICULUS RETRACTOR (MISCELLANEOUS) ×2
TRAY FOLEY BAG SILVER LF 14FR (SET/KITS/TRAYS/PACK) ×3 IMPLANT

## 2017-11-18 NOTE — Progress Notes (Signed)
Labor Progress Note Malena Catholicumama Vernier is a 27 y.o. G2P1001 at 444w1d presented for PROM at term, TOLAC.  S:  Comfortable with epidural. Had HA earlier, Tylenol helped.   O:  BP 102/63   Pulse 67   Temp 98.7 F (37.1 C) (Oral)   Resp 18   Ht 5\' 7"  (1.702 m)   Wt 246 lb (111.6 kg)   LMP 01/28/2017   SpO2 100%   BMI 38.53 kg/m  EFM: baseline 135 bpm/ mod variability/ + accels/ no decels  Toco: 2-3 SVE: Dilation: 5.5 Effacement (%): 90 Cervical Position: Posterior Station: -2 Presentation: Vertex Exam by:: unchanged-MBhambri,cnm Pitocin: 30 mu/min MVUs: 240  A/P: 27 y.o. G2P1001 2844w1d  1. Labor: arrested 2. FWB: Cat I 3. Pain: epidural  Arrest of labor despite adequate ctx w/ Pitocin augmentation. Consult with Dr. Alysia PennaErvin. Plan for RCS. Pt agrees.   Donette LarryMelanie Khamron Gellert, CNM 3:39 PM

## 2017-11-18 NOTE — Anesthesia Preprocedure Evaluation (Signed)
Anesthesia Evaluation  Patient identified by MRN, date of birth, ID band Patient awake    Reviewed: Allergy & Precautions, H&P , NPO status , Patient's Chart, lab work & pertinent test results  Airway Mallampati: II   Neck ROM: full    Dental   Pulmonary former smoker,    breath sounds clear to auscultation       Cardiovascular negative cardio ROS   Rhythm:regular Rate:Normal     Neuro/Psych    GI/Hepatic   Endo/Other  obese  Renal/GU      Musculoskeletal   Abdominal   Peds  Hematology   Anesthesia Other Findings   Reproductive/Obstetrics (+) Pregnancy                             Anesthesia Physical Anesthesia Plan  ASA: II  Anesthesia Plan: Epidural   Post-op Pain Management:    Induction: Intravenous  PONV Risk Score and Plan: 2 and Treatment may vary due to age or medical condition  Airway Management Planned: Natural Airway  Additional Equipment:   Intra-op Plan:   Post-operative Plan:   Informed Consent: I have reviewed the patients History and Physical, chart, labs and discussed the procedure including the risks, benefits and alternatives for the proposed anesthesia with the patient or authorized representative who has indicated his/her understanding and acceptance.     Plan Discussed with: Anesthesiologist  Anesthesia Plan Comments:         Anesthesia Quick Evaluation

## 2017-11-18 NOTE — Anesthesia Procedure Notes (Signed)
Epidural Patient location during procedure: OB Start time: 11/18/2017 5:03 AM End time: 11/18/2017 5:13 AM  Staffing Anesthesiologist: Achille RichHodierne, Naquisha Whitehair, MD Performed: anesthesiologist   Preanesthetic Checklist Completed: patient identified, site marked, pre-op evaluation, timeout performed, IV checked, risks and benefits discussed and monitors and equipment checked  Epidural Patient position: sitting Prep: DuraPrep Patient monitoring: heart rate, cardiac monitor, continuous pulse ox and blood pressure Approach: midline Location: L2-L3 Injection technique: LOR saline  Needle:  Needle type: Tuohy  Needle gauge: 17 G Needle length: 9 cm Needle insertion depth: 7 cm Catheter type: closed end flexible Catheter size: 19 Gauge Catheter at skin depth: 13 cm Test dose: negative and Other  Assessment Events: blood not aspirated, injection not painful, no injection resistance and negative IV test  Additional Notes Informed consent obtained prior to proceeding including risk of failure, 1% risk of PDPH, risk of minor discomfort and bruising.  Discussed rare but serious complications including epidural abscess, permanent nerve injury, epidural hematoma.  Discussed alternatives to epidural analgesia and patient desires to proceed.  Timeout performed pre-procedure verifying patient name, procedure, and platelet count.  Patient tolerated procedure well. Reason for block:procedure for pain

## 2017-11-18 NOTE — Progress Notes (Signed)
Erin Benjamin is a 27 y.o. G2P1001 at 285w1d by ultrasound admitted for PROM @ 1800 on 11/16/17.    Subjective: Pt reports contractions are becoming more painful and she desires epidural. S/O in room for support.  Objective: BP 127/85   Pulse 70   Temp 98.1 F (36.7 C) (Oral)   Resp 18   Ht 5\' 7"  (1.702 m)   Wt 246 lb (111.6 kg)   LMP 01/28/2017   SpO2 100%   BMI 38.53 kg/m  No intake/output data recorded. No intake/output data recorded.  FHT:  FHR: 135 bpm, variability: moderate,  accelerations:  Present,  decelerations:  Absent UC:   regular, every 2-3 minutes SVE:   Dilation: 5 Effacement (%): 70 Station: -2 Exam by:: Sharen CounterLisa Benjamin, CNM  IUPC placed for improved adjustment of Pitocin for adequate contractions.  Placed without difficulty.  Pt tolerated well.  Labs: Lab Results  Component Value Date   WBC 11.7 (H) 11/17/2017   HGB 11.6 (L) 11/17/2017   HCT 35.7 (L) 11/17/2017   MCV 86.2 11/17/2017   PLT 234 11/17/2017    Assessment / Plan: Augmentation of labor, progressing well  Labor: Progressing normally Preeclampsia:  labs stable Fetal Wellbeing:  Category I Pain Control:  Labor support without medications I/D:  GBS neg Anticipated MOD:  VBAC  Erin Benjamin 11/18/2017, 4:47 AM

## 2017-11-18 NOTE — Progress Notes (Signed)
ANTIBIOTIC CONSULT NOTE - INITIAL  Pharmacy Consult for Gentamicin Indication: Prolonged ROM  No Known Allergies  Patient Measurements: Height: 5\' 7"  (170.2 cm) Weight: 246 lb (111.6 kg) IBW/kg (Calculated) : 61.6 kg Adjusted Body Weight: 77 kg  Vital Signs: Temp: 97.4 F (36.3 C) (04/06 0731) Temp Source: Oral (04/06 0601) BP: 109/66 (04/06 0800) Pulse Rate: 58 (04/06 0800)  Labs: Recent Labs    11/17/17 1533  WBC 11.7*  HGB 11.6*  PLT 234      Microbiology: Recent Results (from the past 720 hour(s))  OB RESULT CONSOLE Group B Strep     Status: None   Collection Time: 11/03/17 12:00 AM  Result Value Ref Range Status   GBS Negative  Final  Culture, beta strep (group b only)     Status: None   Collection Time: 11/03/17 11:23 AM  Result Value Ref Range Status   Strep Gp B Culture Negative Negative Final    Comment: Centers for Disease Control and Prevention (CDC) and American Congress of Obstetricians and Gynecologists (ACOG) guidelines for prevention of perinatal group B streptococcal (GBS) disease specify co-collection of a vaginal and rectal swab specimen to maximize sensitivity of GBS detection. Per the CDC and ACOG, swabbing both the lower vagina and rectum substantially increases the yield of detection compared with sampling the vagina alone. Penicillin G, ampicillin, or cefazolin are indicated for intrapartum prophylaxis of perinatal GBS colonization. Reflex susceptibility testing should be performed prior to use of clindamycin only on GBS isolates from penicillin-allergic women who are considered a high risk for anaphylaxis. Treatment with vancomycin without additional testing is warranted if resistance to clindamycin is noted.     Medications:  Ampicillin 2 grams IV Q6 hr  Assessment: 27 y.o. female G2P1001 at 5462w1d who had ROM @ 18:00 on 11/17/47 Estimated Ke =0.412, Vd = 0.35 L//g  Goal of Therapy:  Gentamicin peak 6-8 mg/L and Trough < 1  mg/L  Plan:  Gentamicin 180 mg IV every 8 hrs  Check Scr with next labs if gentamicin continued. Will check gentamicin levels if continued > 72hr or clinically indicated.  Erin Benjamin, Erin Benjamin 11/18/2017,8:53 AM

## 2017-11-18 NOTE — Op Note (Signed)
Cesarean Section Procedure Note  11/18/2017  9:00 PM  PATIENT:  Erin Benjamin  27 y.o. female  PRE-OPERATIVE DIAGNOSIS:  Repeat Cesarean section for failure to progress  POST-OPERATIVE DIAGNOSIS:  Repeat Cesarean section for failure to progress  PROCEDURE:  Procedure(s): CESAREAN SECTION (N/A)  SURGEON:  Surgeon(s) and Role:    * Hermina StaggersErvin, Taira Knabe L, MD - Primary  ASSISTANTS: none   ANESTHESIA:   epidural  EBL:  Total I/O In: 1700 [I.V.:1700] Out: 1301 [Urine:400; Blood:901]  BLOOD ADMINISTERED:none  DRAINS: none   LOCAL MEDICATIONS USED:  NONE  SPECIMEN:  No Specimen  DISPOSITION OF SPECIMEN:  N/A   Procedure Details   The patient was seen in the Holding Room. The risks, benefits, complications, treatment options, and expected outcomes were discussed with the patient.  The patient concurred with the proposed plan, giving informed consent.  The site of surgery properly noted/marked. The patient was taken to Operating Room # 9, identified as Erin Catholicumama Sporrer and the procedure verified as C-Section Delivery. A Time Out was held and the above information confirmed.  After induction of anesthesia, the patient was draped and prepped in the usual sterile manner. A Pfannenstiel incision was made and carried down through the subcutaneous tissue to the fascia. Fascial incision was made and extended transversely. The fascia was separated from the underlying rectus tissue superiorly and inferiorly. The peritoneum was identified and entered. Peritoneal incision was extended longitudinally. The utero-vesical peritoneal reflection was incised transversely and the bladder flap was bluntly freed from the lower uterine segment. A low transverse uterine incision was made. A viable female infant was delivered without problems. Nuchal cord x 1 easily reduced. Cord clamped and cut after 1 minute.Wt and Apgar's pending. Cord blood was obtained for evaluation. The placenta was removed intact and appeared  normal. The uterine outline, tubes and ovaries appeared normal. The uterine incision was closed with running locked sutures of Chromic in 2 layer fashion. Bladder flap was reapproximated. Hemostasis was observed. Lavage was carried out until clear. Abd muscle was closed with 2/0 Vicryl.The fascia was then reapproximated with running sutures of Vicryl. The skin was reapproximated with Vicryl.  Instrument, sponge, and needle counts were correct prior the abdominal closure and at the conclusion of the case.   Complications:  None; patient tolerated the procedure well.  COUNTS:  YES  PLAN OF CARE: Admit to inpatient   PATIENT DISPOSITION:  PACU - hemodynamically stable.   Delay start of Pharmacological VTE agent (>24hrs) due to surgical blood loss or risk of bleeding: not applicable             Disposition: PACU - hemodynamically stable.         Condition: stable   Hermina StaggersErvin, Maile Linford L, MD 11/18/2017 9:00 PM

## 2017-11-18 NOTE — Progress Notes (Signed)
2 tylenol tablets found around patient's bed while prepping pt for c/section. Pt stated she fell asleep and forgot to swallow them. Tablets placed in sharps box.

## 2017-11-18 NOTE — Progress Notes (Signed)
C/o itching. Offered Benadryl. Declined. Given wet washcloth and lotion. Pt states it helps relieve symptoms.

## 2017-11-18 NOTE — Progress Notes (Addendum)
Labor Progress Note Erin Benjamin is a 27 y.o. G2P1001 at 6822w1d presented for PROM a term, TOLAC  S:  Comfortable with epidural, no c/o.   O:  BP 105/65   Pulse 66   Temp 97.8 F (36.6 C) (Oral)   Resp 20   Ht 5\' 7"  (1.702 m)   Wt 246 lb (111.6 kg)   LMP 01/28/2017   SpO2 100%   BMI 38.53 kg/m  EFM: baseline 125 bpm/ mod variability/ + accels/ no decels  Toco: 2-5 SVE: Dilation: 5.5 Effacement (%): 90 Cervical Position: Posterior Station: -2 Presentation: Vertex Exam by:: unchanged-MBhambri,cnm Pitocin: 20 mu/min MVU: 105  A/P: 27 y.o. G2P1001 5322w1d  1. Labor: protracted 2. FWB: Cat I 3. Pain: epidural  Titrate Pitocin to achieve adequate labor. Guarded for SVD. Will watch closely.   Donette LarryMelanie Mehul Rudin, CNM 12:11 PM

## 2017-11-18 NOTE — Transfer of Care (Signed)
Immediate Anesthesia Transfer of Care Note  Patient: Erin Benjamin  Procedure(s) Performed: CESAREAN SECTION (N/A )  Patient Location: PACU  Anesthesia Type:Epidural  Level of Consciousness: awake, alert  and oriented  Airway & Oxygen Therapy: Patient Spontanous Breathing  Post-op Assessment: Report given to RN and Post -op Vital signs reviewed and stable  Post vital signs: Reviewed and stable  HR 62, RR 12, SaO2 100%, BP 99/67 Last Vitals:  Vitals Value Taken Time  BP 97/68 11/18/2017  8:43 PM  Temp    Pulse 54 11/18/2017  8:45 PM  Resp 15 11/18/2017  8:45 PM  SpO2 100 % 11/18/2017  8:45 PM  Vitals shown include unvalidated device data.  Last Pain:  Vitals:   11/18/17 1900  TempSrc:   PainSc: 9       Patients Stated Pain Goal: 2 (11/18/17 0800)  Complications: No apparent anesthesia complications

## 2017-11-18 NOTE — Progress Notes (Signed)
Patient ID: Erin Benjamin, female   DOB: Oct 01, 1990, 27 y.o.   [facility] [id number] [facility]  Pt has failed to make cervical changes despite pitocin augmentation since 6 AM. Prior c section for NRFHT's C section recommended to pt R/B/Post op care reviewed with pt Pt verbalized understanding and agrees with POC. Nursing and anesthesia aware. Will proceed to OR when ready.

## 2017-11-19 ENCOUNTER — Encounter (HOSPITAL_COMMUNITY): Payer: Self-pay | Admitting: Obstetrics and Gynecology

## 2017-11-19 LAB — CBC
HEMATOCRIT: 29.8 % — AB (ref 36.0–46.0)
Hemoglobin: 9.7 g/dL — ABNORMAL LOW (ref 12.0–15.0)
MCH: 28 pg (ref 26.0–34.0)
MCHC: 32.6 g/dL (ref 30.0–36.0)
MCV: 85.9 fL (ref 78.0–100.0)
PLATELETS: 185 10*3/uL (ref 150–400)
RBC: 3.47 MIL/uL — ABNORMAL LOW (ref 3.87–5.11)
RDW: 14.9 % (ref 11.5–15.5)
WBC: 15 10*3/uL — ABNORMAL HIGH (ref 4.0–10.5)

## 2017-11-19 MED ORDER — FERROUS SULFATE 325 (65 FE) MG PO TABS
325.0000 mg | ORAL_TABLET | Freq: Two times a day (BID) | ORAL | Status: DC
  Administered 2017-11-19 – 2017-11-21 (×4): 325 mg via ORAL
  Filled 2017-11-19 (×4): qty 1

## 2017-11-19 NOTE — Anesthesia Postprocedure Evaluation (Signed)
Anesthesia Post Note  Patient: Erin Benjamin  Procedure(s) Performed: CESAREAN SECTION (N/A )     Patient location during evaluation: Mother Baby Anesthesia Type: Epidural Level of consciousness: awake, awake and alert and oriented Pain management: pain level controlled Vital Signs Assessment: post-procedure vital signs reviewed and stable Respiratory status: spontaneous breathing, nonlabored ventilation and respiratory function stable Cardiovascular status: blood pressure returned to baseline and stable Postop Assessment: no headache, no backache, epidural receding, patient able to bend at knees, no apparent nausea or vomiting and adequate PO intake Anesthetic complications: no Comments: Visited patient this am at approx 0730 at which time patient complained of inability to weight bear on left leg with numbness (right leg completely resolved from epidural) and evaluation showed only able to lift left leg off bed 3 inches with limited bending at knee and pedal push - immediately notified RN (who was already aware) and MD Hodierne and plan was to reassess in approximately 4-6 hours, revisited patient again at 1235 noon according to plan and patient had ambulated to the bathroom RN present and patient while in bathroom verbalizes that left leg numbness and symptoms were completely resolved and RN agrees.      Last Vitals:  Vitals:   11/19/17 0806 11/19/17 1228  BP: (!) 98/56 108/60  Pulse: (!) 56 63  Resp: 18 20  Temp: 36.9 C 36.8 C  SpO2: 99% 100%    Last Pain:  Vitals:   11/19/17 1233  TempSrc:   PainSc: (P) 0-No pain   Pain Goal: Patients Stated Pain Goal: 2 (11/18/17 0800)               Mauricia AreaWINN,Morgaine Kimball

## 2017-11-19 NOTE — Progress Notes (Signed)
Mother of baby was referred for a completed Edinburgh score of 9. CSW consult requirements include a EDPS score of ten or more, or a yes on question ten.   Please contact CSW if mother of baby requests, if needs arise, or if mother of baby scores greater than a nine or answers yes to question ten on Edinburgh Postpartum Depression Screen.   Edwin Dadaarol Kreston Ahrendt, MSW, LCSW-A Clinical Social Worker [facility] [phone]

## 2017-11-19 NOTE — Progress Notes (Addendum)
Subjective: Postpartum Day 1: Cesarean Delivery Patient reports tolerating PO, + flatus and no problems voiding.  Patient reports numbness and tingling in her left leg.   Objective: Vital signs in last 24 hours: Temp:  [97.4 F (36.3 C)-98.8 F (37.1 C)] 98.4 F (36.9 C) (04/07 0806) Pulse Rate:  [39-78] 56 (04/07 0806) Resp:  [12-20] 18 (04/07 0806) BP: (94-126)/(55-94) 98/56 (04/07 0806) SpO2:  [79 %-100 %] 99 % (04/07 0806)  Physical Exam:  General: alert and cooperative Lochia: appropriate Uterine Fundus: firm Incision: no significant drainage, no significant erythema DVT Evaluation: No evidence of DVT seen on physical exam. Neuro: Still with numbness and tingling in left leg. Able to wiggle toes, bend knee, and has sensation but much more difficulty moving than RLE.  Recent Labs    11/17/17 1533 11/19/17 0530  HGB 11.6* 9.7*  HCT 35.7* 29.8*    Assessment/Plan: Status post Cesarean section. Doing well postoperatively. Would like for anaesthesia to evaluate patient regarding prolonged numbness post epidural. Continue current care. Hemoglobin from 11.6 to 9.7, will start on supplemental iron 325mg  bid  Myrene BuddyJacob Fletcher 11/19/2017, 8:59 AM I assessed this pt and agree with the above assessment

## 2017-11-19 NOTE — Progress Notes (Signed)
[facility]RA Erin Benjamin recently made rounds. Both patient and I informed him that the patient's left lower extremity is still numb and tingling from the thigh extending to right below the knee. Patient is able to wiggle her toes and bend her knee on that side. Erin Benjamin stated that he would inform Dr. Dagoberto LigasHedierne about the patient's prolonged numbness in that LLE this morning as soon as he arrived to the hospital.

## 2017-11-20 ENCOUNTER — Encounter: Payer: Self-pay | Admitting: *Deleted

## 2017-11-20 ENCOUNTER — Other Ambulatory Visit: Payer: Medicaid Other

## 2017-11-20 NOTE — Lactation Note (Signed)
This note was copied from a baby's chart. Lactation Consultation Note  Patient Name: Erin Benjamin WUJWJ'XToday's Date: 11/20/2017 Reason for consult: Initial assessment;NICU baby Baby is 5740 hours old in NICU for antibiotics.  Mom has been pumping every 3 hours but not obtaining milk.  Reassured mom and encouraged to continue pumping and hand expression.  [facility] a pump [facility]  She plans on getting WIC. Encouraged to put baby to breast if able and call for assist prn.  Maternal Data Has patient been taught Hand Expression?: Yes Does the patient have breastfeeding experience prior to this delivery?: No  Feeding Feeding Type: Formula Nipple Type: Slow - flow Length of feed: 30 min  LATCH Score                   Interventions    Lactation Tools Discussed/Used WIC Program: No Pump Review: Setup, frequency, and cleaning;Milk Storage Initiated by:: RN Date initiated:: 11/18/17   Consult Status Consult Status: Follow-up Date: 11/21/17 Follow-up type: In-patient    Huston FoleyMOULDEN, Erin Rappleye S 11/20/2017, 12:45 PM

## 2017-11-20 NOTE — Progress Notes (Signed)
POSTPARTUM PROGRESS NOTE  POD #2  Subjective:  Erin Benjamin is a 27 y.o. [id number] s/p rLTCS at 4727w1d.  She reports she doing well. No acute events overnight. She reports she is doing well. Baby is NICU, but she reports she is doing overall well. She has been ambulating w/o problems. She denies any problems with voiding or po intake. Denies nausea or vomiting. She has passed flatus. Pain is well controlled with pain meds.  Lochia is moderate and has decreased significantly.  Objective: Blood pressure 105/69, pulse 71, temperature 98.2 F (36.8 C), temperature source Oral, resp. rate 18, height 5\' 7"  (1.702 m), weight 246 lb (111.6 kg), last menstrual period 01/28/2017, SpO2 99 %, unknown if currently breastfeeding.  Physical Exam:  General: alert, cooperative and no distress Chest: no respiratory distress Heart:regular rate, distal pulses intact Abdomen: soft, nontender,  Uterine Fundus: firm, appropriately tender DVT Evaluation: No calf swelling or tenderness Extremities: no edema Skin: warm, dry; incision dressing clean/dry/intac  Recent Labs    11/17/17 1533 11/19/17 0530  HGB 11.6* 9.7*  HCT 35.7* 29.8*    Assessment/Plan: Erin Erin Benjamin is a 27 y.o. [id number] s/p rLTCS at 4827w1d for failed TOLAC.  POD#2 - Doing welll; pain well-controlled. H/H appropriate  Routine postpartum care  OOB, ambulated  Lovenox for VTE prophylaxis Anemia: asymptomatic  On po ferrous sulfate BID Contraception: wants patch Feeding: breast/pumping  Dispo: Plan for discharge tomorrow.   LOS: 3 days   Kandra NicolasJulie P DegeleMD 11/20/2017, 1:39 PM

## 2017-11-21 MED ORDER — OXYCODONE-ACETAMINOPHEN 5-325 MG PO TABS: 1.0000 | ORAL_TABLET | Freq: Three times a day (TID) | ORAL | 0 refills | Status: DC | PRN

## 2017-11-21 MED ORDER — BISACODYL 10 MG RE SUPP
10.0000 mg | Freq: Once | RECTAL | Status: AC
  Administered 2017-11-21: 10 mg via RECTAL
  Filled 2017-11-21: qty 1

## 2017-11-21 MED ORDER — IBUPROFEN 400 MG PO TABS: 800.0000 mg | ORAL_TABLET | Freq: Four times a day (QID) | ORAL | 0 refills | Status: DC | PRN

## 2017-11-21 NOTE — Discharge Summary (Signed)
OB Discharge Summary     Patient Name: Erin Benjamin DOB: 03/26/1991 MRN: [id number]  Date of admission: 11/17/2017 Delivering MD: Hermina Staggers   Date of discharge: 11/21/2017  Admitting diagnosis: CTX,LEAKING,SPOTTING Intrauterine pregnancy: [redacted]w[redacted]d     Secondary diagnosis:  Active Problems:   Premature rupture of membranes   Encounter for induction of labor   Post-operative state  Additional problems: s/p repeat C/S     Discharge diagnosis: Term Pregnancy Delivered                                                                                                Post partum procedures:none  Augmentation: none  Complications: None  Hospital course:  Repeat C/S   27 y.o. yo G2P2002 at [redacted]w[redacted]d was admitted to the hospital 11/17/2017 for PPROM and had repeat cesarean section with the following indication:pror C/S.  Membrane Rupture Time/Date: 6:00 PM ,11/17/2017   Patient delivered a Viable infant.11/18/2017  Details of operation can be found in separate operative note.  Pateint had an uncomplicated postpartum course.  She is ambulating, tolerating a regular diet, passing flatus, and urinating well. Patient is discharged home in stable condition on  11/21/17         Physical exam  Vitals:   11/20/17 0746 11/20/17 1719 11/21/17 0020 11/21/17 0600  BP: 105/69 (!) 124/93 127/83 110/69  Pulse: 71 79 79 68  Resp: 18 16 18 18   Temp: 98.2 F (36.8 C) 97.9 F (36.6 C) 98.2 F (36.8 C) 98.4 F (36.9 C)  TempSrc: Oral Oral Oral Oral  SpO2:  99% 99% 98%  Weight:      Height:       General: alert, cooperative and no distress Lochia: appropriate Uterine Fundus: firm Incision: Healing well with no significant drainage DVT Evaluation: No evidence of DVT seen on physical exam. No significant calf/ankle edema. Labs: Lab Results  Component Value Date   WBC 15.0 (H) 11/19/2017   HGB 9.7 (L) 11/19/2017   HCT 29.8 (L) 11/19/2017   MCV 85.9 11/19/2017   PLT 185 11/19/2017   CMP Latest  Ref Rng & Units 08/21/2017  Glucose 65 - 99 mg/dL 69  BUN 6 - 20 mg/dL 8  Creatinine 4.09 - 8.11 mg/dL 9.14  Sodium 782 - 956 mmol/L 136  Potassium 3.5 - 5.2 mmol/L 4.3  Chloride 96 - 106 mmol/L 99  CO2 20 - 29 mmol/L 19(L)  Calcium 8.7 - 10.2 mg/dL 8.9  Total Protein 6.0 - 8.5 g/dL 6.6  Total Bilirubin 0.0 - 1.2 mg/dL <2.1  Alkaline Phos 39 - 117 IU/L 75  AST 0 - 40 IU/L 16  ALT 0 - 32 IU/L 12    Discharge instruction: per After Visit Summary and "Baby and Me Booklet".  After visit meds:  Allergies as of 11/21/2017   No Known Allergies     Medication List    TAKE these medications   ibuprofen 400 MG tablet Commonly known as:  ADVIL,MOTRIN Take 2 tablets (800 mg total) by mouth every 6 (six) hours as needed.   oxyCODONE-acetaminophen 5-325 MG tablet Commonly  known as:  PERCOCET/ROXICET Take 1 tablet by mouth every 8 (eight) hours as needed for severe pain.   Prenatal Vitamins 0.8 MG tablet Take 1 tablet by mouth daily.   terconazole 0.4 % vaginal cream Commonly known as:  TERAZOL 7 Place 1 applicator vaginally at bedtime.       Diet: routine diet  Activity: Advance as tolerated. Pelvic rest for 6 weeks.   Outpatient follow up: 2-week incision check  Postpartum contraception: patch  Newborn Data: Live born female  Birth Weight: 8 lb 1.8 oz (3680 g) APGAR: 8, 9  Newborn Delivery   Birth date/time:  11/18/2017 19:47:00 Delivery type:  C-Section, Low Transverse Trial of labor:  Yes C-section categorization:  Repeat     Baby Feeding: Bottle and Breast Disposition:NICU   11/21/2017 Frederik PearJulie P Davonne Jarnigan, MD

## 2017-11-21 NOTE — Discharge Instructions (Signed)

## 2017-11-21 NOTE — Lactation Note (Signed)
This note was copied from a baby's chart. Lactation Consultation Note  Patient Name: Erin Benjamin WUJWJ'XToday's Date: 11/21/2017  [facility] [facility]   Maternal Data    Feeding Feeding Type: Formula Nipple Type: Slow - flow Length of feed: 30 min  LATCH Score                   Interventions    Lactation Tools Discussed/Used     Consult Status      Huston FoleyMOULDEN, Koben Daman S 11/21/2017, 10:34 AM

## 2017-11-21 NOTE — Lactation Note (Signed)
This note was copied from a baby's chart. Lactation Consultation Note  Patient Name: Erin Benjamin ZOXWR'UToday's Date: 11/21/2017 Reason for consult: Follow-up assessment;NICU baby Called for latch assist.  Mom and baby to be discharged today.  Mom has short nipples and baby has been receiving bottles.  Baby unable to grasp breast tissue.  Mom pumping every 3 hours but not obtaining milk yet.  24 mm nipple shield applied but baby held nipple in mouth with no suck elicited.  Baby acting sleepy at this time.  Mom working on bottle feeding baby.  Stressed importance of pumping.  Encouraged mom to practice latching to breast with cues and pumping 8-12 times/24 hours.  Recommended an outpatient appointment.  Maternal Data    Feeding Feeding Type: Breast Fed  LATCH Score Latch: Too sleepy or reluctant, no latch achieved, no sucking elicited.  Audible Swallowing: None  Type of Nipple: Everted at rest and after stimulation  Comfort (Breast/Nipple): Soft / non-tender  Hold (Positioning): Assistance needed to correctly position infant at breast and maintain latch.  LATCH Score: 5  Interventions Interventions: Assisted with latch  Lactation Tools Discussed/Used Tools: Nipple Shields Nipple shield size: 24   Consult Status Consult Status: Follow-up Follow-up type: Call as needed    Huston FoleyMOULDEN, [name] 11/21/2017, 11:59 AM

## 2017-11-24 ENCOUNTER — Inpatient Hospital Stay (HOSPITAL_COMMUNITY): Admission: RE | Admit: 2017-11-24 | Payer: Medicaid Other | Source: Ambulatory Visit

## 2018-01-30 ENCOUNTER — Ambulatory Visit: Payer: Medicaid Other | Admitting: Advanced Practice Midwife

## 2018-02-01 ENCOUNTER — Emergency Department (HOSPITAL_COMMUNITY)
Admission: EM | Admit: 2018-02-01 | Discharge: 2018-02-01 | Disposition: A | Discharge status: Home / Self Care | Payer: Medicaid Other | Attending: Emergency Medicine | Admitting: Emergency Medicine

## 2018-02-01 ENCOUNTER — Other Ambulatory Visit: Payer: Self-pay

## 2018-02-01 DIAGNOSIS — Z87891 Personal history of nicotine dependence: Secondary | ICD-10-CM | POA: Insufficient documentation

## 2018-02-01 DIAGNOSIS — M545 Low back pain, unspecified: Secondary | ICD-10-CM

## 2018-02-01 MED ORDER — METHOCARBAMOL 500 MG PO TABS: 500.0000 mg | ORAL_TABLET | Freq: Three times a day (TID) | ORAL | 0 refills | Status: DC | PRN

## 2018-02-01 MED ORDER — NAPROXEN 500 MG PO TABS
500.0000 mg | ORAL_TABLET | Freq: Two times a day (BID) | ORAL | 0 refills | Status: DC
Start: 2018-02-01 — End: 2018-04-25

## 2018-02-01 MED ORDER — OXYCODONE-ACETAMINOPHEN 5-325 MG PO TABS
1.0000 | ORAL_TABLET | ORAL | Status: DC | PRN
  Administered 2018-02-01: 1 via ORAL
  Filled 2018-02-01: qty 1

## 2018-02-01 MED ORDER — KETOROLAC TROMETHAMINE 60 MG/2ML IM SOLN
60.0000 mg | Freq: Once | INTRAMUSCULAR | Status: AC
  Administered 2018-02-01: 60 mg via INTRAMUSCULAR
  Filled 2018-02-01: qty 2

## 2018-02-01 MED ORDER — LIDOCAINE 5 % EX PTCH: 1.0000 | MEDICATED_PATCH | CUTANEOUS | 0 refills | Status: DC

## 2018-02-01 MED ORDER — LIDOCAINE 5 % EX PTCH
1.0000 | MEDICATED_PATCH | CUTANEOUS | Status: DC
  Administered 2018-02-01: 1 via TRANSDERMAL
  Filled 2018-02-01: qty 1

## 2018-02-01 MED ORDER — METHOCARBAMOL 500 MG PO TABS
500.0000 mg | ORAL_TABLET | Freq: Once | ORAL | Status: AC
  Administered 2018-02-01: 500 mg via ORAL
  Filled 2018-02-01: qty 1

## 2018-02-01 NOTE — ED Provider Notes (Signed)
[facility] EMERGENCY DEPARTMENT Provider Note   CSN: [id number] Arrival date & time: 02/01/18  2019    History   Chief Complaint Chief Complaint  Patient presents with  . Back Pain    HPI Erin Benjamin is a 27 y.o. female.  The history is provided by the patient. No language interpreter was used.  Back Pain   This is a recurrent problem. The current episode started 6 to 12 hours ago. The problem occurs constantly. The problem has not changed since onset.The pain is associated with twisting. The pain is present in the lumbar spine. The quality of the pain is described as aching and stabbing. The pain does not radiate. The pain is moderate. The symptoms are aggravated by twisting and bending. Pertinent negatives include no fever, no numbness, no bowel incontinence, no perianal numbness, no bladder incontinence, no tingling and no weakness. Treatments tried: back bracing. The treatment provided no relief. Risk factors: recent pregnancy.    Past Medical History:  Diagnosis Date  . Medical history non-contributory     Patient Active Problem List   Diagnosis Date Noted  . Post-operative state 11/18/2017  . Premature rupture of membranes 11/17/2017  . Encounter for induction of labor 11/17/2017  . Bacterial vaginosis 06/15/2017  . Previous cesarean delivery affecting pregnancy, antepartum 05/29/2017  . Supervision of other normal pregnancy, antepartum 05/18/2017  . Former cigarette smoker 05/18/2017    Past Surgical History:  Procedure Laterality Date  . CESAREAN SECTION  08/2014  . CESAREAN SECTION N/A 11/18/2017   Procedure: CESAREAN SECTION;  Surgeon: Hermina Staggers, MD;  Location: [facility] BIRTHING SUITES;  Service: Obstetrics;  Laterality: N/A;     OB History    Gravida  2   Para  2   Term  2   Preterm  0   AB  0   Living  2     SAB      TAB      Ectopic      Multiple  0   Live Births  2            Home Medications    Prior to  Admission medications   Medication Sig Start Date End Date Taking? Authorizing Provider  lidocaine (LIDODERM) 5 % Place 1 patch onto the skin daily. Remove & Discard patch within 12 hours or as directed by MD 02/01/18   Antony Madura, PA-C  methocarbamol (ROBAXIN) 500 MG tablet Take 1 tablet (500 mg total) by mouth every 8 (eight) hours as needed for muscle spasms. 02/01/18   Antony Madura, PA-C  naproxen (NAPROSYN) 500 MG tablet Take 1 tablet (500 mg total) by mouth 2 (two) times daily. 02/01/18   Antony Madura, PA-C  oxyCODONE-acetaminophen (PERCOCET/ROXICET) 5-325 MG tablet Take 1 tablet by mouth every 8 (eight) hours as needed for severe pain. 11/21/17   Degele, Kandra Nicolas, MD  Prenatal Multivit-Min-Fe-FA (PRENATAL VITAMINS) 0.8 MG tablet Take 1 tablet by mouth daily. Patient not taking: Reported on 11/17/2017 07/13/17   Conan Bowens, MD  terconazole (TERAZOL 7) 0.4 % vaginal cream Place 1 applicator vaginally at bedtime. Patient not taking: Reported on 10/09/2017 08/26/17   Armando Reichert, CNM    Family History Family History  Problem Relation Age of Onset  . Diabetes Mother     Social History Social History   Tobacco Use  . Smoking status: Former Smoker    Packs/day: 1.00    Types: Cigarettes    Last attempt  to quit: 02/20/2017    Years since quitting: 0.9  . Smokeless tobacco: Never Used  . Tobacco comment: stopped with pregnancy  Substance Use Topics  . Alcohol use: Yes    Comment: stopped when found out pregnant  . Drug use: Yes    Types: Marijuana    Comment: stopped 02/2017     Allergies   Patient has no known allergies.   Review of Systems Review of Systems  Constitutional: Negative for fever.  Gastrointestinal: Negative for bowel incontinence.  Genitourinary: Negative for bladder incontinence.  Musculoskeletal: Positive for back pain.  Neurological: Negative for tingling, weakness and numbness.  Ten systems reviewed and are negative for acute change, except as noted in  the HPI.    Physical Exam Updated Vital Signs BP (!) 145/91 (BP Location: Right Arm)   Pulse 83   Temp 98.4 F (36.9 C) (Oral)   Resp 14   Ht 5\' 8"  (1.727 m)   Wt 90.7 kg (200 lb)   SpO2 97%   BMI 30.41 kg/m   Physical Exam  Constitutional: She is oriented to person, place, and time. She appears well-developed and well-nourished. No distress.  Nontoxic appearing and in NAD  HENT:  Head: Normocephalic and atraumatic.  Eyes: Conjunctivae and EOM are normal. No scleral icterus.  Neck: Normal range of motion.  Cardiovascular: Normal rate, regular rhythm and intact distal pulses.  Pulmonary/Chest: Effort normal. No respiratory distress.  Respirations even and unlabored  Musculoskeletal: Normal range of motion.  Right lumbar paraspinal muscles. No bony deformities, step offs, crepitus to the lumbosacral midline.  Neurological: She is alert and oriented to person, place, and time. She exhibits normal muscle tone. Coordination normal.  Sensation to light touch intact in BLE.  5/5 strength against resistance in all major muscle groups of the bilateral lower extremities.  Ambulatory with steady gait.  Skin: Skin is warm and dry. No rash noted. She is not diaphoretic. No erythema. No pallor.  Psychiatric: She has a normal mood and affect. Her behavior is normal.  Nursing note and vitals reviewed.    ED Treatments / Results  Labs (all labs ordered are listed, but only abnormal results are displayed) Labs Reviewed - No data to display  EKG None  Radiology No results found.  Procedures Procedures (including critical care time)  Medications Ordered in ED Medications  oxyCODONE-acetaminophen (PERCOCET/ROXICET) 5-325 MG per tablet 1 tablet (1 tablet Oral Given 02/01/18 2036)  ketorolac (TORADOL) injection 60 mg (has no administration in time range)  methocarbamol (ROBAXIN) tablet 500 mg (has no administration in time range)  lidocaine (LIDODERM) 5 % 1 patch (has no administration  in time range)     Initial Impression / Assessment and Plan / ED Course  I have reviewed the triage vital signs and the nursing notes.  Pertinent labs & imaging results that were available during my care of the patient were reviewed by me and considered in my medical decision making (see chart for details).     Patient with back pain.  Hx of similar back pain, worsening since 1700.  Patient neurovascularly intact on exam.  Patient can walk but states is painful.  No loss of bowel or bladder control.  No concern for cauda equina.  No fever, hx of CA, hx of IVDU.  RICE protocol and pain medicine indicated and discussed with patient.  Return precautions discussed and provided. Patient discharged in stable condition with no unaddressed concerns.   Final Clinical Impressions(s) / ED Diagnoses  Final diagnoses:  Acute right-sided low back pain without sciatica    ED Discharge Orders        Ordered    naproxen (NAPROSYN) 500 MG tablet  2 times daily     02/01/18 2314    methocarbamol (ROBAXIN) 500 MG tablet  Every 8 hours PRN     02/01/18 2314    lidocaine (LIDODERM) 5 %  Every 24 hours     02/01/18 2314       Antony Madura, PA-C 02/01/18 2327    Tilden Fossa, MD 02/02/18 [phone]

## 2018-02-01 NOTE — ED Notes (Signed)
ED Provider at bedside. 

## 2018-02-01 NOTE — ED Notes (Signed)
Pt ready for discharge VS documented 

## 2018-02-01 NOTE — ED Triage Notes (Signed)
Patient states that she feels helpless and is in tears. States that she twisted to the side to put her 352 month old onto the bed and then felt really bad back pain. States that she has had this once before.

## 2018-02-01 NOTE — Discharge Instructions (Signed)
Alternate ice and heat to areas of injury 3-4 times per day to limit inflammation and spasm.  Avoid strenuous activity and heavy lifting.  We recommend consistent use of naproxen in addition to Robaxin for muscle spasms.  You may also use Lidoderm patches as needed.  Do not drive or drink alcohol after taking Robaxin as it may make you drowsy and impair your judgment.  We recommend follow-up with a primary care doctor to ensure resolution of symptoms.  Return to the ED for any new or concerning symptoms.

## 2018-02-13 ENCOUNTER — Ambulatory Visit (INDEPENDENT_AMBULATORY_CARE_PROVIDER_SITE_OTHER): Payer: Medicaid Other | Admitting: Obstetrics and Gynecology

## 2018-02-13 ENCOUNTER — Other Ambulatory Visit: Payer: Self-pay

## 2018-02-13 ENCOUNTER — Encounter: Payer: Self-pay | Admitting: Obstetrics and Gynecology

## 2018-02-13 DIAGNOSIS — Z1389 Encounter for screening for other disorder: Secondary | ICD-10-CM

## 2018-02-13 DIAGNOSIS — Z30011 Encounter for initial prescription of contraceptive pills: Secondary | ICD-10-CM

## 2018-02-13 LAB — POCT PREGNANCY, URINE: Preg Test, Ur: NEGATIVE

## 2018-02-13 MED ORDER — NORGESTIMATE-ETH ESTRADIOL 0.25-35 MG-MCG PO TABS: 1.0000 | ORAL_TABLET | Freq: Every day | ORAL | 11 refills | Status: DC

## 2018-02-13 NOTE — Progress Notes (Signed)
Post Partum Exam  Erin Benjamin is a 27 y.o. 772P2002 female who presents for a postpartum visit. She is 3 months postpartum following a low cervical transverse Cesarean section. I have fully reviewed the prenatal and intrapartum course. The delivery was at 40 gestational weeks.  Anesthesia: epidural. Postpartum course has been uncomplicated . Baby's course has been uncomplicated. Baby is feeding by bottle - gerber . Bleeding no bleeding. Bowel function is normal. Bladder function is normal. Patient is sexually active. Contraception method is condoms currently. Desires OCPs. Postpartum depression screening:neg  The following portions of the patient's history were reviewed and updated as appropriate: allergies, current medications, past family history, past medical history, past social history, past surgical history and problem list. Last pap smear done 05/18/2017 and was Normal  Review of Systems Pertinent items are noted in HPI.    Objective:  Blood pressure 129/90, pulse 69, weight 98.9 kg (218 lb), unknown if currently breastfeeding.  General:  alert, cooperative and no distress   Breasts:  negative  Lungs: normal work of breathing  Heart:  regular rate and rhythm  Abdomen: soft, non-tender; bowel sounds normal; no masses,  no organomegaly   Pelvic:  deferred  Skin: Warm and dry  Psych:  normal mood and affect  MSK: normal ROM; no edema        Results for orders placed or performed in visit on 02/13/18  Pregnancy, urine POC  Result Value Ref Range   Preg Test, Ur NEGATIVE NEGATIVE    Assessment:   Normal postpartum exam. Pap smear not done at today's visit.   Plan:   1. Contraception: Rx for OCPs given. Pregnancy test negative. 2. Pap smear normal last year.  3. Follow up in: 1 year or as needed.    Caryl AdaJazma Phelps, DO OB Fellow [facility]

## 2018-02-13 NOTE — Patient Instructions (Signed)

## 2018-02-19 ENCOUNTER — Encounter: Payer: Self-pay | Admitting: Obstetrics and Gynecology

## 2018-03-28 ENCOUNTER — Encounter: Payer: Self-pay | Admitting: General Practice

## 2018-03-28 ENCOUNTER — Telehealth: Payer: Self-pay | Admitting: Obstetrics & Gynecology

## 2018-03-28 NOTE — Telephone Encounter (Signed)
Patient want a refill on her Birth Control pills to last her until her appt she coming in to switch over to the [place] [facility]

## 2018-04-19 ENCOUNTER — Ambulatory Visit: Payer: Medicaid Other | Admitting: Obstetrics & Gynecology

## 2018-04-25 ENCOUNTER — Ambulatory Visit (INDEPENDENT_AMBULATORY_CARE_PROVIDER_SITE_OTHER): Payer: Medicaid Other | Admitting: Internal Medicine

## 2018-04-25 ENCOUNTER — Encounter: Payer: Self-pay | Admitting: Internal Medicine

## 2018-04-25 VITALS — BP 121/79 | HR 66

## 2018-04-25 DIAGNOSIS — Z3046 Encounter for surveillance of implantable subdermal contraceptive: Secondary | ICD-10-CM | POA: Diagnosis not present

## 2018-04-25 DIAGNOSIS — Z975 Presence of (intrauterine) contraceptive device: Secondary | ICD-10-CM | POA: Insufficient documentation

## 2018-04-25 DIAGNOSIS — Z3009 Encounter for other general counseling and advice on contraception: Secondary | ICD-10-CM

## 2018-04-25 LAB — POCT PREGNANCY, URINE: Preg Test, Ur: NEGATIVE

## 2018-04-25 MED ORDER — ETONOGESTREL 68 MG ~~LOC~~ IMPL
68.0000 mg | DRUG_IMPLANT | Freq: Once | SUBCUTANEOUS | Status: AC
  Administered 2018-04-25: 68 mg via SUBCUTANEOUS

## 2018-04-25 NOTE — Progress Notes (Signed)
   Subjective:    Erin Benjamin - 27 y.o. female MRN [id number]  Date of birth: 01-08-1991  HPI  Erin Benjamin is a 27 y.o. [phone] female here for contraception management. Requests Nexplanon insertion. Is currently using OCPs for contraception but is having a hard time remembering to take them each day. Has had some irregular bleeding with these pills. Last had sexual intercourse yesterday, used a condom.     OB History    Gravida  2   Para  2   Term  2   Preterm  0   AB  0   Living  2     SAB      TAB      Ectopic      Multiple  0   Live Births  2           -  reports that she quit smoking about 14 months ago. Her smoking use included cigarettes. She smoked 1.00 pack per day. She has never used smokeless tobacco. - Review of Systems: Per HPI. - Past Medical History: Patient Active Problem List   Diagnosis Date Noted  . Nexplanon in place 04/25/2018  . Post-operative state 11/18/2017  . Premature rupture of membranes 11/17/2017  . Encounter for induction of labor 11/17/2017  . Bacterial vaginosis 06/15/2017  . Previous cesarean delivery affecting pregnancy, antepartum 05/29/2017  . Supervision of other normal pregnancy, antepartum 05/18/2017  . Former cigarette smoker 05/18/2017   - Medications: reviewed and updated   Objective:   Physical Exam BP 121/79   Pulse 66   LMP 04/14/2018 (Approximate)  Gen: NAD, alert, cooperative with exam, well-appearing Psych: good insight, alert and oriented   Procedure Note: Nexplanon insertion  Risks/benefits/side effects of Nexplanon have been discussed with patient and her questions have been answered. Patient is aware of the common side effect of irregular bleeding, which the incidence of decreases over time. Informed consent was obtained and signed.   She is right-handed, so her left arm, approximately 10 cm proximal from the elbow, was cleansed with alcohol and anesthetized with 2 mL of 1% Lidocaine with Epi.   The area was cleansed again with betadine and the Nexplanon was inserted per manufacturer's recommendations without difficulty.  A pressure bandage were applied.  Pt was instructed to keep the area clean and dry, remove pressure bandage in 24 hours.  She was given a card indicating date Nexplanon was inserted and date it needs to be removed. Follow-up PRN problems.     Assessment & Plan:   1. Encounter for counseling regarding contraception   2. Nexplanon in place   Upreg negative. Discussed risks and benefits of Nexplanon with patient. Educated that the most common side effect was irregular bleeding, patient wishes to continue with procedure. Nexplanon inserted successfully, patient tolerated procedure well. Counseled on use of back up contraceptive method for 7 days and after care instructions.   Return in about 1 year (around 04/26/2019) for annual exam.  Marcy Siren, D.O. OB Fellow  04/25/2018, 7:12 PM

## 2018-04-25 NOTE — Addendum Note (Signed)
Addended by: Faythe Casa on: 04/25/2018 07:28 PM   Modules accepted: Orders

## 2018-04-25 NOTE — Patient Instructions (Signed)

## 2018-05-02 ENCOUNTER — Encounter: Payer: Self-pay | Admitting: *Deleted

## 2018-07-29 ENCOUNTER — Emergency Department (HOSPITAL_COMMUNITY)
Admission: EM | Admit: 2018-07-29 | Discharge: 2018-07-29 | Disposition: A | Discharge status: Home / Self Care | Payer: Medicaid Other | Attending: Emergency Medicine | Admitting: Emergency Medicine

## 2018-07-29 ENCOUNTER — Other Ambulatory Visit: Payer: Self-pay

## 2018-07-29 DIAGNOSIS — Z79899 Other long term (current) drug therapy: Secondary | ICD-10-CM | POA: Insufficient documentation

## 2018-07-29 DIAGNOSIS — R1013 Epigastric pain: Secondary | ICD-10-CM | POA: Insufficient documentation

## 2018-07-29 DIAGNOSIS — R112 Nausea with vomiting, unspecified: Secondary | ICD-10-CM | POA: Diagnosis not present

## 2018-07-29 DIAGNOSIS — Z87891 Personal history of nicotine dependence: Secondary | ICD-10-CM | POA: Insufficient documentation

## 2018-07-29 LAB — URINALYSIS, ROUTINE W REFLEX MICROSCOPIC
Bilirubin Urine: NEGATIVE
Glucose, UA: NEGATIVE mg/dL
Ketones, ur: NEGATIVE mg/dL
Leukocytes, UA: NEGATIVE
Nitrite: NEGATIVE
Protein, ur: NEGATIVE mg/dL
Specific Gravity, Urine: 1.026 (ref 1.005–1.030)
pH: 6 (ref 5.0–8.0)

## 2018-07-29 LAB — COMPREHENSIVE METABOLIC PANEL
ALBUMIN: 3.6 g/dL (ref 3.5–5.0)
ALT: 19 U/L (ref 0–44)
AST: 22 U/L (ref 15–41)
Alkaline Phosphatase: 50 U/L (ref 38–126)
Anion gap: 12 (ref 5–15)
BUN: 12 mg/dL (ref 6–20)
CHLORIDE: 102 mmol/L (ref 98–111)
CO2: 24 mmol/L (ref 22–32)
CREATININE: 0.79 mg/dL (ref 0.44–1.00)
Calcium: 8.7 mg/dL — ABNORMAL LOW (ref 8.9–10.3)
GFR calc Af Amer: >60 mL/min (ref >60)
GFR calc non Af Amer: >60 mL/min (ref >60)
Glucose, Bld: 123 mg/dL — ABNORMAL HIGH (ref 70–99)
Potassium: 3.5 mmol/L (ref 3.5–5.1)
SODIUM: 138 mmol/L (ref 135–145)
Total Bilirubin: 0.6 mg/dL (ref 0.3–1.2)
Total Protein: 6.6 g/dL (ref 6.5–8.1)

## 2018-07-29 LAB — CBC WITH DIFFERENTIAL/PLATELET
ABS IMMATURE GRANULOCYTES: 0.03 10*3/uL (ref 0.00–0.07)
BASOS ABS: 0 10*3/uL (ref 0.0–0.1)
BASOS PCT: 0 %
Eosinophils Absolute: 0 10*3/uL (ref 0.0–0.5)
Eosinophils Relative: 0 %
HCT: 41.9 % (ref 36.0–46.0)
Hemoglobin: 12.5 g/dL (ref 12.0–15.0)
IMMATURE GRANULOCYTES: 0 %
Lymphocytes Relative: 14 %
Lymphs Abs: 1.6 10*3/uL (ref 0.7–4.0)
MCH: 25.6 pg — AB (ref 26.0–34.0)
MCHC: 29.8 g/dL — ABNORMAL LOW (ref 30.0–36.0)
MCV: 85.9 fL (ref 80.0–100.0)
Monocytes Absolute: 0.4 10*3/uL (ref 0.1–1.0)
Monocytes Relative: 3 %
NEUTROS PCT: 83 %
NRBC: 0 % (ref 0.0–0.2)
Neutro Abs: 9 10*3/uL — ABNORMAL HIGH (ref 1.7–7.7)
PLATELETS: 355 10*3/uL (ref 150–400)
RBC: 4.88 MIL/uL (ref 3.87–5.11)
RDW: 15.8 % — ABNORMAL HIGH (ref 11.5–15.5)
WBC: 11 10*3/uL — AB (ref 4.0–10.5)

## 2018-07-29 LAB — I-STAT CHEM 8, ED
BUN: 14 mg/dL (ref 6–20)
Calcium, Ion: 1.17 mmol/L (ref 1.15–1.40)
Chloride: 103 mmol/L (ref 98–111)
Creatinine, Ser: 0.7 mg/dL (ref 0.44–1.00)
Glucose, Bld: 116 mg/dL — ABNORMAL HIGH (ref 70–99)
HCT: 41 % (ref 36.0–46.0)
Hemoglobin: 13.9 g/dL (ref 12.0–15.0)
Potassium: 3.4 mmol/L — ABNORMAL LOW (ref 3.5–5.1)
Sodium: 138 mmol/L (ref 135–145)
TCO2: 25 mmol/L (ref 22–32)

## 2018-07-29 LAB — I-STAT BETA HCG BLOOD, ED (MC, WL, AP ONLY): I-stat hCG, quantitative: <5 m[IU]/mL (ref <5)

## 2018-07-29 LAB — LIPASE, BLOOD: Lipase: 40 U/L (ref 11–51)

## 2018-07-29 MED ORDER — FAMOTIDINE 20 MG PO TABS: 20.0000 mg | ORAL_TABLET | Freq: Two times a day (BID) | ORAL | 0 refills | Status: DC

## 2018-07-29 MED ORDER — ALUM & MAG HYDROXIDE-SIMETH 200-200-20 MG/5ML PO SUSP
30.0000 mL | Freq: Once | ORAL | Status: AC
  Administered 2018-07-29: 30 mL via ORAL
  Filled 2018-07-29: qty 30

## 2018-07-29 MED ORDER — PANTOPRAZOLE SODIUM 20 MG PO TBEC
20.0000 mg | DELAYED_RELEASE_TABLET | Freq: Once | ORAL | Status: AC
  Administered 2018-07-29: 20 mg via ORAL
  Filled 2018-07-29: qty 1

## 2018-07-29 MED ORDER — SODIUM CHLORIDE 0.9 % IV BOLUS
1000.0000 mL | Freq: Once | INTRAVENOUS | Status: AC
  Administered 2018-07-29: 1000 mL via INTRAVENOUS

## 2018-07-29 MED ORDER — ONDANSETRON HCL 4 MG/2ML IJ SOLN
4.0000 mg | Freq: Once | INTRAMUSCULAR | Status: AC
  Administered 2018-07-29: 4 mg via INTRAVENOUS
  Filled 2018-07-29: qty 2

## 2018-07-29 MED ORDER — MORPHINE SULFATE (PF) 2 MG/ML IV SOLN
2.0000 mg | Freq: Once | INTRAVENOUS | Status: AC
  Administered 2018-07-29: 2 mg via INTRAVENOUS
  Filled 2018-07-29: qty 1

## 2018-07-29 MED ORDER — LIDOCAINE VISCOUS HCL 2 % MT SOLN
15.0000 mL | Freq: Once | OROMUCOSAL | Status: AC
  Administered 2018-07-29: 15 mL via ORAL
  Filled 2018-07-29: qty 15

## 2018-07-29 NOTE — ED Provider Notes (Signed)
[facility] EMERGENCY DEPARTMENT Provider Note   CSN: [id number] Arrival date & time: 07/29/18  1559     History   Chief Complaint No chief complaint on file.   HPI Erin Benjamin is a 27 y.o. female.  The history is provided by the patient. No language interpreter was used.  Abdominal Pain   This is a new problem. The current episode started 6 to 12 hours ago. The problem has not changed since onset.The pain is associated with eating, suspicious food intake and alcohol use. The pain is located in the epigastric region. The quality of the pain is sharp. The pain is at a severity of 10/10. The pain is moderate. Associated symptoms include nausea and vomiting. Pertinent negatives include fever, diarrhea, flatus, constipation, dysuria, hematuria, arthralgias and myalgias. Nothing aggravates the symptoms. Nothing relieves the symptoms. Past medical history comments: has been taking daily ibuprofen (2 doses) for the past week.    Past Medical History:  Diagnosis Date  . Medical history non-contributory     Patient Active Problem List   Diagnosis Date Noted  . Nexplanon in place 04/25/2018  . Post-operative state 11/18/2017  . Premature rupture of membranes 11/17/2017  . Encounter for induction of labor 11/17/2017  . Bacterial vaginosis 06/15/2017  . Previous cesarean delivery affecting pregnancy, antepartum 05/29/2017  . Supervision of other normal pregnancy, antepartum 05/18/2017  . Former cigarette smoker 05/18/2017    Past Surgical History:  Procedure Laterality Date  . CESAREAN SECTION  08/2014  . CESAREAN SECTION N/A 11/18/2017   Procedure: CESAREAN SECTION;  Surgeon: Chancy Milroy, MD;  Location: [facility];  Service: Obstetrics;  Laterality: N/A;     OB History    Gravida  2   Para  2   Term  2   Preterm  0   AB  0   Living  2     SAB      TAB      Ectopic      Multiple  0   Live Births  2            Home  Medications    Prior to Admission medications   Medication Sig Start Date End Date Taking? Authorizing Provider  norgestimate-ethinyl estradiol (ORTHO-CYCLEN,SPRINTEC,PREVIFEM) 0.25-35 MG-MCG tablet Take 1 tablet by mouth daily. 02/13/18   Katheren Shams, DO    Family History Family History  Problem Relation Age of Onset  . Diabetes Mother     Social History Social History   Tobacco Use  . Smoking status: Former Smoker    Packs/day: 1.00    Types: Cigarettes    Last attempt to quit: 02/20/2017    Years since quitting: 1.4  . Smokeless tobacco: Never Used  . Tobacco comment: stopped with pregnancy  Substance Use Topics  . Alcohol use: Yes    Comment: stopped when found out pregnant  . Drug use: Yes    Types: Marijuana    Comment: stopped 02/2017     Allergies   Patient has no known allergies.   Review of Systems Review of Systems  Constitutional: Positive for activity change and appetite change. Negative for chills and fever.  HENT: Negative for ear pain and sore throat.   Eyes: Negative for pain and visual disturbance.  Respiratory: Negative for cough and shortness of breath.   Cardiovascular: Negative for chest pain and palpitations.  Gastrointestinal: Positive for abdominal pain, nausea and vomiting. Negative for constipation, diarrhea and flatus.  Genitourinary: Negative for dysuria and hematuria.  Musculoskeletal: Negative for arthralgias, back pain and myalgias.  Skin: Negative for color change and rash.  Neurological: Negative for seizures and syncope.  All other systems reviewed and are negative.    Physical Exam Updated Vital Signs There were no vitals taken for this visit.  Physical Exam Vitals signs and nursing note reviewed.  Constitutional:      General: She is not in acute distress.    Appearance: She is well-developed.  HENT:     Head: Normocephalic and atraumatic.  Eyes:     Conjunctiva/sclera: Conjunctivae normal.  Neck:      Musculoskeletal: Neck supple.  Cardiovascular:     Rate and Rhythm: Normal rate and regular rhythm.     Heart sounds: No murmur.  Pulmonary:     Effort: Pulmonary effort is normal. No respiratory distress.     Breath sounds: Normal breath sounds.  Abdominal:     Palpations: Abdomen is soft.     Tenderness: There is abdominal tenderness in the epigastric area. There is no guarding or rebound.  Skin:    General: Skin is warm and dry.  Neurological:     Mental Status: She is alert.      ED Treatments / Results  Labs (all labs ordered are listed, but only abnormal results are displayed) Labs Reviewed - No data to display  EKG None  Radiology No results found.  Procedures Procedures (including critical care time)  Medications Ordered in ED Medications - No data to display   Initial Impression / Assessment and Plan / ED Course  I have reviewed the triage vital signs and the nursing notes.  Pertinent labs & imaging results that were available during my care of the patient were reviewed by me and considered in my medical decision making (see chart for details).    Patient is a 27 y.o. female with PMHx of ibuprofen use and recent alcohol use presenting for epigastric abdominal pain and 3 episodes of vomiting this morning. Patient is non-toxic appearing, in no acute distress, complaining of abdominal pain specifically epigastric in location. Likely secondary to gastritis vs. Pancreatitis vs. Gastroenteritis. Unlikely to be pancreatitis, lipase is normal. Unlikely to be UTI, no lower abdominal pain, UA is unremarkable. Unlikely to gall bladder etiology, mild RUQ pain but negative murphy's sign and LFT/alk phos and Tbili normal. No indication for emergent CT scan at this time. Patient will be given IV pain medications, fluids, IV anti-emetics.  Upon reassessment, patient is feeling better/states that she still has mild pain. Viscous lidocaine and maalox, protonix were given.    Patient counseled on eating a bland diet and avoiding alcohol/ibuprofen. Pepcid was prescribed. Patient given strict return precautions. All questions answered. Patient safe for discharge.   Final Clinical Impressions(s) / ED Diagnoses   Final diagnoses:  Nausea and vomiting, intractability of vomiting not specified, unspecified vomiting type  Epigastric abdominal pain    ED Discharge Orders         Ordered    famotidine (PEPCID) 20 MG tablet  2 times daily     07/29/18 1713           Erskine Squibb, MD 07/29/18 1714    Lajean Saver, MD 07/29/18 [phone]

## 2018-07-29 NOTE — ED Triage Notes (Signed)
Pt presents to ED with abdominal pain and vomiting x3 onset this morning. States the pain makes it hard for her to breathe and it radiates to her back.

## 2018-07-30 ENCOUNTER — Encounter (HOSPITAL_COMMUNITY)
Admission: EM | Disposition: A | Discharge status: Home / Self Care | Payer: Self-pay | Source: Home / Self Care | Attending: Emergency Medicine

## 2018-07-30 ENCOUNTER — Emergency Department (HOSPITAL_COMMUNITY): Payer: Medicaid Other

## 2018-07-30 ENCOUNTER — Other Ambulatory Visit: Payer: Self-pay

## 2018-07-30 ENCOUNTER — Observation Stay (HOSPITAL_COMMUNITY): Payer: Medicaid Other | Admitting: Anesthesiology

## 2018-07-30 ENCOUNTER — Observation Stay (HOSPITAL_COMMUNITY)
Admission: EM | Admit: 2018-07-30 | Discharge: 2018-07-31 | Disposition: A | Discharge status: Home / Self Care | Payer: Medicaid Other | Attending: Surgery | Admitting: Surgery

## 2018-07-30 ENCOUNTER — Encounter (HOSPITAL_COMMUNITY): Payer: Self-pay | Admitting: Emergency Medicine

## 2018-07-30 ENCOUNTER — Observation Stay (HOSPITAL_COMMUNITY): Payer: Medicaid Other

## 2018-07-30 DIAGNOSIS — Z87891 Personal history of nicotine dependence: Secondary | ICD-10-CM | POA: Insufficient documentation

## 2018-07-30 DIAGNOSIS — K8012 Calculus of gallbladder with acute and chronic cholecystitis without obstruction: Principal | ICD-10-CM | POA: Insufficient documentation

## 2018-07-30 DIAGNOSIS — Z30011 Encounter for initial prescription of contraceptive pills: Secondary | ICD-10-CM

## 2018-07-30 DIAGNOSIS — K8000 Calculus of gallbladder with acute cholecystitis without obstruction: Secondary | ICD-10-CM

## 2018-07-30 DIAGNOSIS — R1011 Right upper quadrant pain: Secondary | ICD-10-CM | POA: Diagnosis present

## 2018-07-30 DIAGNOSIS — Z419 Encounter for procedure for purposes other than remedying health state, unspecified: Secondary | ICD-10-CM

## 2018-07-30 DIAGNOSIS — Z79899 Other long term (current) drug therapy: Secondary | ICD-10-CM | POA: Insufficient documentation

## 2018-07-30 DIAGNOSIS — I10 Essential (primary) hypertension: Secondary | ICD-10-CM | POA: Diagnosis not present

## 2018-07-30 HISTORY — DX: Calculus of gallbladder with acute cholecystitis without obstruction: K80.00

## 2018-07-30 HISTORY — PX: CHOLECYSTECTOMY: SHX55

## 2018-07-30 LAB — CBG MONITORING, ED: Glucose-Capillary: 109 mg/dL — ABNORMAL HIGH (ref 70–99)

## 2018-07-30 LAB — COMPREHENSIVE METABOLIC PANEL
ALT: 24 U/L (ref 0–44)
AST: 31 U/L (ref 15–41)
Albumin: 3.6 g/dL (ref 3.5–5.0)
Alkaline Phosphatase: 52 U/L (ref 38–126)
Anion gap: 10 (ref 5–15)
BUN: <5 mg/dL — ABNORMAL LOW (ref 6–20)
CO2: 24 mmol/L (ref 22–32)
CREATININE: 0.94 mg/dL (ref 0.44–1.00)
Calcium: 8.7 mg/dL — ABNORMAL LOW (ref 8.9–10.3)
Chloride: 101 mmol/L (ref 98–111)
GFR calc Af Amer: >60 mL/min (ref >60)
GFR calc non Af Amer: >60 mL/min (ref >60)
Glucose, Bld: 114 mg/dL — ABNORMAL HIGH (ref 70–99)
Potassium: 3.7 mmol/L (ref 3.5–5.1)
Sodium: 135 mmol/L (ref 135–145)
Total Bilirubin: 1.1 mg/dL (ref 0.3–1.2)
Total Protein: 6.9 g/dL (ref 6.5–8.1)

## 2018-07-30 LAB — CBC WITH DIFFERENTIAL/PLATELET
Abs Immature Granulocytes: 0.07 10*3/uL (ref 0.00–0.07)
Basophils Absolute: 0 10*3/uL (ref 0.0–0.1)
Basophils Relative: 0 %
EOS PCT: 0 %
Eosinophils Absolute: 0 10*3/uL (ref 0.0–0.5)
HCT: 41.4 % (ref 36.0–46.0)
Hemoglobin: 12.9 g/dL (ref 12.0–15.0)
Immature Granulocytes: 1 %
LYMPHS PCT: 7 %
Lymphs Abs: 1 10*3/uL (ref 0.7–4.0)
MCH: 26.5 pg (ref 26.0–34.0)
MCHC: 31.2 g/dL (ref 30.0–36.0)
MCV: 85.2 fL (ref 80.0–100.0)
MONO ABS: 1 10*3/uL (ref 0.1–1.0)
Monocytes Relative: 7 %
Neutro Abs: 13.1 10*3/uL — ABNORMAL HIGH (ref 1.7–7.7)
Neutrophils Relative %: 85 %
Platelets: 308 10*3/uL (ref 150–400)
RBC: 4.86 MIL/uL (ref 3.87–5.11)
RDW: 15.7 % — ABNORMAL HIGH (ref 11.5–15.5)
WBC: 15.2 10*3/uL — AB (ref 4.0–10.5)
nRBC: 0 % (ref 0.0–0.2)

## 2018-07-30 LAB — LIPASE, BLOOD: Lipase: 44 U/L (ref 11–51)

## 2018-07-30 SURGERY — LAPAROSCOPIC CHOLECYSTECTOMY WITH INTRAOPERATIVE CHOLANGIOGRAM
Anesthesia: General | Site: Abdomen

## 2018-07-30 MED ORDER — MEPERIDINE HCL 50 MG/ML IJ SOLN: 6.2500 mg | INTRAMUSCULAR | Status: DC | PRN

## 2018-07-30 MED ORDER — ONDANSETRON HCL 4 MG/2ML IJ SOLN: 4.0000 mg | Freq: Four times a day (QID) | INTRAMUSCULAR | Status: DC | PRN

## 2018-07-30 MED ORDER — HYDROCODONE-ACETAMINOPHEN 7.5-325 MG PO TABS: 1.0000 | ORAL_TABLET | Freq: Once | ORAL | Status: DC | PRN

## 2018-07-30 MED ORDER — SODIUM CHLORIDE 0.9 % IV SOLN
INTRAVENOUS | Status: DC | PRN
  Administered 2018-07-30: 16:00:00

## 2018-07-30 MED ORDER — LIDOCAINE 2% (20 MG/ML) 5 ML SYRINGE
INTRAMUSCULAR | Status: DC | PRN
  Administered 2018-07-30: 100 mg via INTRAVENOUS

## 2018-07-30 MED ORDER — ONDANSETRON HCL 4 MG/2ML IJ SOLN
4.0000 mg | Freq: Once | INTRAMUSCULAR | Status: AC
  Administered 2018-07-30: 4 mg via INTRAVENOUS
  Filled 2018-07-30: qty 2

## 2018-07-30 MED ORDER — PROPOFOL 10 MG/ML IV BOLUS
INTRAVENOUS | Status: DC | PRN
  Administered 2018-07-30: 200 mg via INTRAVENOUS

## 2018-07-30 MED ORDER — BUPIVACAINE-EPINEPHRINE 0.25% -1:200000 IJ SOLN
INTRAMUSCULAR | Status: DC | PRN
  Administered 2018-07-30: 6 mL

## 2018-07-30 MED ORDER — FENTANYL CITRATE (PF) 100 MCG/2ML IJ SOLN
INTRAMUSCULAR | Status: DC | PRN
  Administered 2018-07-30 (×2): 50 ug via INTRAVENOUS
  Administered 2018-07-30: 100 ug via INTRAVENOUS
  Administered 2018-07-30: 150 ug via INTRAVENOUS

## 2018-07-30 MED ORDER — MIDAZOLAM HCL 2 MG/2ML IJ SOLN
INTRAMUSCULAR | Status: AC
  Filled 2018-07-30: qty 2

## 2018-07-30 MED ORDER — SCOPOLAMINE 1 MG/3DAYS TD PT72
MEDICATED_PATCH | TRANSDERMAL | Status: DC | PRN
  Administered 2018-07-30: 1 via TRANSDERMAL

## 2018-07-30 MED ORDER — SUCCINYLCHOLINE CHLORIDE 200 MG/10ML IV SOSY
PREFILLED_SYRINGE | INTRAVENOUS | Status: DC | PRN
  Administered 2018-07-30: 120 mg via INTRAVENOUS

## 2018-07-30 MED ORDER — ONDANSETRON 4 MG PO TBDP: 4.0000 mg | ORAL_TABLET | Freq: Four times a day (QID) | ORAL | Status: DC | PRN

## 2018-07-30 MED ORDER — SCOPOLAMINE 1 MG/3DAYS TD PT72
MEDICATED_PATCH | TRANSDERMAL | Status: AC
  Filled 2018-07-30: qty 1

## 2018-07-30 MED ORDER — MORPHINE SULFATE (PF) 4 MG/ML IV SOLN
4.0000 mg | Freq: Once | INTRAVENOUS | Status: AC
  Administered 2018-07-30: 4 mg via INTRAVENOUS
  Filled 2018-07-30: qty 1

## 2018-07-30 MED ORDER — FENTANYL CITRATE (PF) 250 MCG/5ML IJ SOLN
INTRAMUSCULAR | Status: AC
  Filled 2018-07-30: qty 5

## 2018-07-30 MED ORDER — PROPOFOL 10 MG/ML IV BOLUS
INTRAVENOUS | Status: AC
  Filled 2018-07-30: qty 20

## 2018-07-30 MED ORDER — SODIUM CHLORIDE 0.9 % IV SOLN
INTRAVENOUS | Status: DC
  Administered 2018-07-30: 15:00:00 via INTRAVENOUS

## 2018-07-30 MED ORDER — OXYCODONE-ACETAMINOPHEN 5-325 MG PO TABS
1.0000 | ORAL_TABLET | ORAL | Status: DC | PRN
  Administered 2018-07-30 – 2018-07-31 (×2): 1 via ORAL
  Filled 2018-07-30 (×2): qty 1

## 2018-07-30 MED ORDER — IOPAMIDOL (ISOVUE-300) INJECTION 61%
INTRAVENOUS | Status: AC
  Filled 2018-07-30: qty 50

## 2018-07-30 MED ORDER — DIPHENHYDRAMINE HCL 50 MG/ML IJ SOLN: 25.0000 mg | Freq: Four times a day (QID) | INTRAMUSCULAR | Status: DC | PRN

## 2018-07-30 MED ORDER — LACTATED RINGERS IV SOLN
INTRAVENOUS | Status: DC | PRN
  Administered 2018-07-30 (×2): via INTRAVENOUS

## 2018-07-30 MED ORDER — DEXAMETHASONE SODIUM PHOSPHATE 10 MG/ML IJ SOLN
INTRAMUSCULAR | Status: DC | PRN
  Administered 2018-07-30: 10 mg via INTRAVENOUS

## 2018-07-30 MED ORDER — SUGAMMADEX SODIUM 200 MG/2ML IV SOLN
INTRAVENOUS | Status: DC | PRN
  Administered 2018-07-30: 200 mg via INTRAVENOUS

## 2018-07-30 MED ORDER — ONDANSETRON HCL 4 MG/2ML IJ SOLN
INTRAMUSCULAR | Status: DC | PRN
  Administered 2018-07-30: 4 mg via INTRAVENOUS

## 2018-07-30 MED ORDER — HYDROMORPHONE HCL 1 MG/ML IJ SOLN: 0.2500 mg | INTRAMUSCULAR | Status: DC | PRN

## 2018-07-30 MED ORDER — 0.9 % SODIUM CHLORIDE (POUR BTL) OPTIME
TOPICAL | Status: DC | PRN
  Administered 2018-07-30: 1000 mL

## 2018-07-30 MED ORDER — POTASSIUM CHLORIDE IN NACL 20-0.9 MEQ/L-% IV SOLN
INTRAVENOUS | Status: DC
  Administered 2018-07-30 – 2018-07-31 (×2): via INTRAVENOUS
  Filled 2018-07-30 (×2): qty 1000

## 2018-07-30 MED ORDER — SODIUM CHLORIDE 0.9 % IR SOLN
Status: DC | PRN
  Administered 2018-07-30: 1000 mL

## 2018-07-30 MED ORDER — SODIUM CHLORIDE 0.9 % IV SOLN
2.0000 g | INTRAVENOUS | Status: DC
  Administered 2018-07-30 – 2018-07-31 (×2): 2 g via INTRAVENOUS
  Filled 2018-07-30 (×3): qty 20

## 2018-07-30 MED ORDER — METOCLOPRAMIDE HCL 5 MG/ML IJ SOLN: 10.0000 mg | Freq: Once | INTRAMUSCULAR | Status: DC | PRN

## 2018-07-30 MED ORDER — MIDAZOLAM HCL 2 MG/2ML IJ SOLN
INTRAMUSCULAR | Status: DC | PRN
  Administered 2018-07-30: 2 mg via INTRAVENOUS

## 2018-07-30 MED ORDER — BUPIVACAINE-EPINEPHRINE (PF) 0.25% -1:200000 IJ SOLN
INTRAMUSCULAR | Status: AC
  Filled 2018-07-30: qty 30

## 2018-07-30 MED ORDER — DIPHENHYDRAMINE HCL 25 MG PO CAPS: 25.0000 mg | ORAL_CAPSULE | Freq: Four times a day (QID) | ORAL | Status: DC | PRN

## 2018-07-30 MED ORDER — HYDROMORPHONE HCL 1 MG/ML IJ SOLN
1.0000 mg | INTRAMUSCULAR | Status: DC | PRN
  Administered 2018-07-30 – 2018-07-31 (×2): 1 mg via INTRAVENOUS
  Filled 2018-07-30 (×2): qty 1

## 2018-07-30 MED ORDER — ROCURONIUM BROMIDE 10 MG/ML (PF) SYRINGE
PREFILLED_SYRINGE | INTRAVENOUS | Status: DC | PRN
  Administered 2018-07-30: 50 mg via INTRAVENOUS
  Administered 2018-07-30: 10 mg via INTRAVENOUS
  Administered 2018-07-30: 20 mg via INTRAVENOUS
  Administered 2018-07-30 (×2): 5 mg via INTRAVENOUS

## 2018-07-30 MED ORDER — SODIUM CHLORIDE 0.9 % IV BOLUS
1000.0000 mL | Freq: Once | INTRAVENOUS | Status: AC
  Administered 2018-07-30: 1000 mL via INTRAVENOUS

## 2018-07-30 SURGICAL SUPPLY — 41 items
APPLIER CLIP ROT 10 11.4 M/L (STAPLE) ×3
BLADE CLIPPER SURG (BLADE) IMPLANT
CANISTER SUCT 3000ML PPV (MISCELLANEOUS) ×3 IMPLANT
CHLORAPREP W/TINT 26ML (MISCELLANEOUS) ×3 IMPLANT
CLIP APPLIE ROT 10 11.4 M/L (STAPLE) ×1 IMPLANT
COVER MAYO STAND STRL (DRAPES) IMPLANT
COVER SURGICAL LIGHT HANDLE (MISCELLANEOUS) ×3 IMPLANT
COVER WAND RF STERILE (DRAPES) IMPLANT
DERMABOND ADVANCED (GAUZE/BANDAGES/DRESSINGS) ×2
DERMABOND ADVANCED .7 DNX12 (GAUZE/BANDAGES/DRESSINGS) ×1 IMPLANT
DRAPE C-ARM 42X72 X-RAY (DRAPES) ×3 IMPLANT
DRAPE WARM FLUID 44X44 (DRAPE) IMPLANT
ELECT REM PT RETURN 9FT ADLT (ELECTROSURGICAL) ×3
ELECTRODE REM PT RTRN 9FT ADLT (ELECTROSURGICAL) ×1 IMPLANT
GLOVE BIO SURGEON STRL SZ8 (GLOVE) ×3 IMPLANT
GLOVE BIOGEL PI IND STRL 8 (GLOVE) ×1 IMPLANT
GLOVE BIOGEL PI INDICATOR 8 (GLOVE) ×2
GOWN STRL REUS W/ TWL LRG LVL3 (GOWN DISPOSABLE) ×2 IMPLANT
GOWN STRL REUS W/ TWL XL LVL3 (GOWN DISPOSABLE) ×1 IMPLANT
GOWN STRL REUS W/TWL LRG LVL3 (GOWN DISPOSABLE) ×4
GOWN STRL REUS W/TWL XL LVL3 (GOWN DISPOSABLE) ×2
KIT BASIN OR (CUSTOM PROCEDURE TRAY) ×3 IMPLANT
KIT TURNOVER KIT B (KITS) ×3 IMPLANT
NS IRRIG 1000ML POUR BTL (IV SOLUTION) ×3 IMPLANT
PAD ARMBOARD 7.5X6 YLW CONV (MISCELLANEOUS) ×3 IMPLANT
POUCH RETRIEVAL ECOSAC 10 (ENDOMECHANICALS) ×1 IMPLANT
POUCH RETRIEVAL ECOSAC 10MM (ENDOMECHANICALS) ×2
SCISSORS LAP 5X35 DISP (ENDOMECHANICALS) ×3 IMPLANT
SET CHOLANGIOGRAPH 5 50 .035 (SET/KITS/TRAYS/PACK) ×3 IMPLANT
SET IRRIG TUBING LAPAROSCOPIC (IRRIGATION / IRRIGATOR) ×3 IMPLANT
SLEEVE ENDOPATH XCEL 5M (ENDOMECHANICALS) ×3 IMPLANT
SPECIMEN JAR SMALL (MISCELLANEOUS) ×3 IMPLANT
SUT MNCRL AB 4-0 PS2 18 (SUTURE) ×3 IMPLANT
TOWEL OR 17X24 6PK STRL BLUE (TOWEL DISPOSABLE) ×3 IMPLANT
TOWEL OR 17X26 10 PK STRL BLUE (TOWEL DISPOSABLE) ×3 IMPLANT
TRAY LAPAROSCOPIC MC (CUSTOM PROCEDURE TRAY) ×3 IMPLANT
TROCAR XCEL BLUNT TIP 100MML (ENDOMECHANICALS) ×3 IMPLANT
TROCAR XCEL NON-BLD 11X100MML (ENDOMECHANICALS) ×3 IMPLANT
TROCAR XCEL NON-BLD 5MMX100MML (ENDOMECHANICALS) ×3 IMPLANT
TUBING INSUFFLATION (TUBING) ×3 IMPLANT
WATER STERILE IRR 1000ML POUR (IV SOLUTION) ×3 IMPLANT

## 2018-07-30 NOTE — ED Notes (Signed)
Report given to Charge RN on short stay.  Patient stable to be transported to room 36 in short stay at this time.  All belongings taken with patient to the floor.

## 2018-07-30 NOTE — ED Triage Notes (Signed)
Onset one day ago seen in the ED and discharged with abdominal pain, nausea, and emesis. States did not feel good during the night and continued to have same symptoms that radiates to lower back pain and hard to take a deep inspiration,

## 2018-07-30 NOTE — Discharge Instructions (Signed)
[facility] ______[facility] °LAPAROSCOPIC SURGERY: POST OP INSTRUCTIONS °Always review your discharge instruction sheet given to you by the facility where your surgery was performed. °IF YOU HAVE DISABILITY OR FAMILY LEAVE FORMS, YOU MUST BRING THEM TO THE OFFICE FOR PROCESSING.   °DO NOT GIVE THEM TO YOUR DOCTOR. ° °1. A prescription for pain medication may be given to you upon discharge.  Take your pain medication as prescribed, if needed.  If narcotic pain medicine is not needed, then you may take acetaminophen (Tylenol) or ibuprofen (Advil) as needed. °2. Take your usually prescribed medications unless otherwise directed. °3. If you need a refill on your pain medication, please contact your pharmacy.  They will contact our office to request authorization. Prescriptions will not be filled after 5pm or on week-ends. °4. You should follow a light diet the first few days after arrival home, such as soup and crackers, etc.  Be sure to include lots of fluids daily. °5. Most patients will experience some swelling and bruising in the area of the incisions.  Ice packs will help.  Swelling and bruising can take several days to resolve.  °6. It is common to experience some constipation if taking pain medication after surgery.  Increasing fluid intake and taking a stool softener (such as Colace) will usually help or prevent this problem from occurring.  A mild laxative (Milk of Magnesia or Miralax) should be taken according to package instructions if there are no bowel movements after 48 hours. °7. Unless discharge instructions indicate otherwise, you may remove your bandages 24-48 hours after surgery, and you may shower at that time.  You may have steri-strips (small skin tapes) in place directly over the incision.  These strips should be left on the skin for 7-10 days.  If your surgeon used skin glue on the incision, you may shower in 24 hours.  The glue will flake off over the next 2-3 weeks.  Any sutures or  staples will be removed at the office during your follow-up visit. °8. ACTIVITIES:  You may resume regular (light) daily activities beginning the next day--such as daily self-care, walking, climbing stairs--gradually increasing activities as tolerated.  You may have sexual intercourse when it is comfortable.  Refrain from any heavy lifting or straining until approved by your doctor. °a. You may drive when you are no longer taking prescription pain medication, you can comfortably wear a seatbelt, and you can safely maneuver your car and apply brakes. °b. RETURN TO WORK:  __________________________________________________________ °9. You should see your doctor in the office for a follow-up appointment approximately 2-3 weeks after your surgery.  Make sure that you call for this appointment within a day or two after you arrive home to insure a convenient appointment time. °10. OTHER INSTRUCTIONS: __________________________________________________________________________________________________________________________ __________________________________________________________________________________________________________________________ °WHEN TO CALL YOUR DOCTOR: °1. Fever over 101.0 °2. Inability to urinate °3. Continued bleeding from incision. °4. Increased pain, redness, or drainage from the incision. °5. Increasing abdominal pain ° °The clinic staff is available to answer your questions during regular business hours.  Please don’t hesitate to call and ask to speak to one of the nurses for clinical concerns.  If you have a medical emergency, go to the nearest emergency room or call 911.  A surgeon from [facility] is always on call at the hospital. °[address], [address], [place], [place]  [address] ? [address] [address], [place], [place]   [address] °[phone] ? [phone] ? FAX [phone] °Web site:   [email or web address] °

## 2018-07-30 NOTE — ED Provider Notes (Signed)
[facility] EMERGENCY DEPARTMENT Provider Note   CSN: [id number] Arrival date & time: 07/30/18  7829     History   Chief Complaint Chief Complaint  Patient presents with  . Abdominal Pain  . Emesis    HPI Erin Benjamin is a 27 y.o. female.  Patient is a 27 year old female with no significant past medical history.  She presents today for evaluation of abdominal pain.  She reports pain to the epigastric and right upper quadrant regions that began yesterday and are worsening.  She was seen here yesterday evening, had laboratory studies, then was discharged.  She returns today stating that her pain is worse and has been unable to eat this morning.  Her pain radiates into her back and states that it makes it difficult for her to breathe.  She denies any fevers or chills.  She denies any diarrhea or constipation.  The history is provided by the patient.  Abdominal Pain   This is a new problem. The current episode started yesterday. The problem occurs constantly. The problem has been gradually worsening. The pain is associated with eating. The pain is located in the RUQ and epigastric region. The quality of the pain is cramping. The pain is moderate. Associated symptoms include vomiting. Pertinent negatives include fever. The symptoms are aggravated by certain positions, palpation and eating. Nothing relieves the symptoms.  Emesis   Associated symptoms include abdominal pain. Pertinent negatives include no fever.    Past Medical History:  Diagnosis Date  . Medical history non-contributory     Patient Active Problem List   Diagnosis Date Noted  . Nexplanon in place 04/25/2018  . Post-operative state 11/18/2017  . Premature rupture of membranes 11/17/2017  . Encounter for induction of labor 11/17/2017  . Bacterial vaginosis 06/15/2017  . Previous cesarean delivery affecting pregnancy, antepartum 05/29/2017  . Supervision of other normal pregnancy, antepartum  05/18/2017  . Former cigarette smoker 05/18/2017    Past Surgical History:  Procedure Laterality Date  . CESAREAN SECTION  08/2014  . CESAREAN SECTION N/A 11/18/2017   Procedure: CESAREAN SECTION;  Surgeon: Hermina Staggers, MD;  Location: [facility];  Service: Obstetrics;  Laterality: N/A;     OB History    Gravida  2   Para  2   Term  2   Preterm  0   AB  0   Living  2     SAB      TAB      Ectopic      Multiple  0   Live Births  2            Home Medications    Prior to Admission medications   Medication Sig Start Date End Date Taking? Authorizing Provider  famotidine (PEPCID) 20 MG tablet Take 1 tablet (20 mg total) by mouth 2 (two) times daily. 07/29/18   Joaquin Courts, MD  norgestimate-ethinyl estradiol (ORTHO-CYCLEN,SPRINTEC,PREVIFEM) 0.25-35 MG-MCG tablet Take 1 tablet by mouth daily. 02/13/18   Pincus Large, DO    Family History Family History  Problem Relation Age of Onset  . Diabetes Mother     Social History Social History   Tobacco Use  . Smoking status: Former Smoker    Packs/day: 1.00    Types: Cigarettes    Last attempt to quit: 02/20/2017    Years since quitting: 1.4  . Smokeless tobacco: Never Used  . Tobacco comment: stopped with pregnancy  Substance Use Topics  .  Alcohol use: Yes    Comment:    . Drug use: Yes    Types: Marijuana    Comment: stopped 02/2017     Allergies   Patient has no known allergies.   Review of Systems Review of Systems  Constitutional: Negative for fever.  Gastrointestinal: Positive for abdominal pain and vomiting.  All other systems reviewed and are negative.    Physical Exam Updated Vital Signs BP (!) 138/93 (BP Location: Left Arm)   Pulse 66   Temp 98.5 F (36.9 C) (Oral)   Resp 16   Ht 5\' 7"  (1.702 m)   Wt 95 kg   SpO2 100%   BMI 32.80 kg/m   Physical Exam Vitals signs and nursing note reviewed.  Constitutional:      General: She is not in acute distress.     Appearance: She is well-developed. She is not diaphoretic.  HENT:     Head: Normocephalic and atraumatic.  Neck:     Musculoskeletal: Normal range of motion and neck supple.  Cardiovascular:     Rate and Rhythm: Normal rate and regular rhythm.     Heart sounds: No murmur. No friction rub. No gallop.   Pulmonary:     Effort: Pulmonary effort is normal. No respiratory distress.     Breath sounds: Normal breath sounds. No wheezing.  Abdominal:     General: Bowel sounds are normal. There is no distension.     Palpations: Abdomen is soft.     Tenderness: There is abdominal tenderness in the right upper quadrant and epigastric area. There is no right CVA tenderness, guarding or rebound.  Musculoskeletal: Normal range of motion.  Skin:    General: Skin is warm and dry.  Neurological:     Mental Status: She is alert and oriented to person, place, and time.      ED Treatments / Results  Labs (all labs ordered are listed, but only abnormal results are displayed) Labs Reviewed  COMPREHENSIVE METABOLIC PANEL  LIPASE, BLOOD  CBC WITH DIFFERENTIAL/PLATELET    EKG None  Radiology No results found.  Procedures Procedures (including critical care time)  Medications Ordered in ED Medications  sodium chloride 0.9 % bolus 1,000 mL (has no administration in time range)  ondansetron (ZOFRAN) injection 4 mg (has no administration in time range)  morphine 4 MG/ML injection 4 mg (has no administration in time range)     Initial Impression / Assessment and Plan / ED Course  I have reviewed the triage vital signs and the nursing notes.  Pertinent labs & imaging results that were available during my care of the patient were reviewed by me and considered in my medical decision making (see chart for details).  Patient presents with epigastric and right upper quadrant pain.  She was seen yesterday evening with similar complaints, then discharged.  Her pain has worsened today. Workup shows  evidence for acute cholecystitis on US.  General surgery has been consulted and will admit.  Final Clinical Impressions(s) / ED Diagnoses   Final diagnoses:  RUQ pain    ED Discharge Orders    None       Geoffery Lyonselo, Juanantonio Stolar, MD 07/30/18 [id number]

## 2018-07-30 NOTE — H&P (Signed)
Erin Benjamin is an 27 y.o. female.   Chief Complaint: Abdominal pain  HPI: Patient is a 27 year old female who was seen yesterday in the ED was 6 to 12 hours of abdominal pain, worse after food intake and alcohol use pain was located in the mid epigastric area she described as 10/10. Labs yesterday showed a normal CMP, WBC 11,000, hemoglobin hematocrit were normal.  Platelets were normal.  Lipase was 40.  Urinalysis was unremarkable.  No imaging was performed yesterday.  Patient improved to the ED with IV pain medications and antiemetics.  She was discharged with viscous lidocaine, Maalox and Protonix.  She reports her pain became worse at home she never got better.  She was unable to eat she had nausea and vomiting along with ongoing abdominal pain mostly in the right upper quadrant.  She returned this a.m with ongoing right upper quadrant abdominal pain, going to her back.  Nausea, vomiting, chills, unable to tolerate anything p.o.  Work-up in the ED today shows she is afebrile blood pressure is elevated 133/98.  No tachycardia.  WBC is up to 15.2, hemoglobin 12.9, hematocrit 41.4, platelets 308,000.  CMP remains normal.  Abdominal ultrasound was obtained this shows cholelithiasis with the largest gallstone measuring 1.2 cm.  A 1.1 cm calculus is in the neck of the gallbladder.  There is slight gallbladder wall thickening and subtle gallbladder wall edema no pericholecystic fluid evident.  Common bile duct was 3 mm with no intrahepatic or extrahepatic biliary dilatation.  We are asked to see.   Past Medical History:  Diagnosis Date  . Medical history non-contributory     Past Surgical History:  Procedure Laterality Date  . CESAREAN SECTION  08/2014  . CESAREAN SECTION N/A 11/18/2017   Procedure: CESAREAN SECTION;  Surgeon: Hermina Staggers, MD;  Location: [facility] BIRTHING SUITES;  Service: Obstetrics;  Laterality: N/A;    Family History  Problem Relation Age of Onset  . Diabetes Mother    Social  History:  reports that she quit smoking about 17 months ago. Her smoking use included cigarettes. She smoked 1.00 pack per day. She has never used smokeless tobacco. She reports current alcohol use. She reports current drug use. Drug: Marijuana. Tobacco half pack per day x8 years  EtOH: About a pint on weekends. Drugs: Weekly marijuana use She lives with her fianc.  She has 2 children ages 36 years old and 70 months old She works as a Agricultural consultant for [employer]   allergies: No Known Allergies  Prior to Admission medications   Medication Sig Start Date End Date Taking? Authorizing Provider  Etonogestrel (NEXPLANON [place]) Inject 1 Device into the skin once.   Yes [provider]  famotidine (PEPCID) 20 MG tablet Take 1 tablet (20 mg total) by mouth 2 (two) times daily. 07/29/18   Joaquin Courts, MD  norgestimate-ethinyl estradiol (ORTHO-CYCLEN,SPRINTEC,PREVIFEM) 0.25-35 MG-MCG tablet Take 1 tablet by mouth daily. Patient not taking: Reported on 07/30/2018 02/13/18   Pincus Large, DO     Results for orders placed or performed during the hospital encounter of 07/30/18 (from the past 48 hour(s))  Comprehensive metabolic panel     Status: Abnormal   Collection Time: 07/30/18 10:27 AM  Result Value Ref Range   Sodium 135 135 - 145 mmol/L   Potassium 3.7 3.5 - 5.1 mmol/L   Chloride 101 98 - 111 mmol/L   CO2 24 22 - 32 mmol/L   Glucose, Bld 114 (H) 70 -  99 mg/dL   BUN <5 (L) 6 - 20 mg/dL   Creatinine, Ser 1.61 0.44 - 1.00 mg/dL   Calcium 8.7 (L) 8.9 - 10.3 mg/dL   Total Protein 6.9 6.5 - 8.1 g/dL   Albumin 3.6 3.5 - 5.0 g/dL   AST 31 15 - 41 U/L   ALT 24 0 - 44 U/L   Alkaline Phosphatase 52 38 - 126 U/L   Total Bilirubin 1.1 0.3 - 1.2 mg/dL   GFR calc non Af Amer >60 >60 mL/min   GFR calc Af Amer >60 >60 mL/min   Anion gap 10 5 - 15    Comment: Performed at [facility], [address], [place], [place] [address]  Lipase, blood     Status: None   Collection  Time: 07/30/18 10:27 AM  Result Value Ref Range   Lipase 44 11 - 51 U/L    Comment: Performed at [facility], [address], [place], [place] [address]  CBC with Differential     Status: Abnormal   Collection Time: 07/30/18 10:27 AM  Result Value Ref Range   WBC 15.2 (H) 4.0 - 10.5 K/uL   RBC 4.86 3.87 - 5.11 MIL/uL   Hemoglobin 12.9 12.0 - 15.0 g/dL   HCT 11.9 14.7 - 82.9 %   MCV 85.2 80.0 - 100.0 fL   MCH 26.5 26.0 - 34.0 pg   MCHC 31.2 30.0 - 36.0 g/dL   RDW 56.2 (H) 13.0 - 86.5 %   Platelets 308 150 - 400 K/uL   nRBC 0.0 0.0 - 0.2 %   Neutrophils Relative % 85 %   Neutro Abs 13.1 (H) 1.7 - 7.7 K/uL   Lymphocytes Relative 7 %   Lymphs Abs 1.0 0.7 - 4.0 K/uL   Monocytes Relative 7 %   Monocytes Absolute 1.0 0.1 - 1.0 K/uL   Eosinophils Relative 0 %   Eosinophils Absolute 0.0 0.0 - 0.5 K/uL   Basophils Relative 0 %   Basophils Absolute 0.0 0.0 - 0.1 K/uL   Immature Granulocytes 1 %   Abs Immature Granulocytes 0.07 0.00 - 0.07 K/uL    Comment: Performed at [facility], [address], [place], [place] [address]   US Abdomen Limited Ruq  Result Date: 07/30/2018 CLINICAL DATA:  Upper abdominal pain EXAM: ULTRASOUND ABDOMEN LIMITED RIGHT UPPER QUADRANT COMPARISON:  None. FINDINGS: Gallbladder: Within the gallbladder, there are echogenic foci which move and shadow consistent with cholelithiasis. Largest gallstone measures 1.2 cm length. A 1.1 cm calculus remains in the neck of the gallbladder. There is slight gallbladder wall thickening and subtle gallbladder wall edema. No pericholecystic fluid evident. No sonographic Murphy sign noted by sonographer. Common bile duct: Diameter: 3 mm. No intrahepatic or extrahepatic biliary duct dilatation. Liver: No focal lesion identified. Within normal limits in parenchymal echogenicity. Portal vein is patent on color Doppler imaging with normal direction of blood flow towards the liver. IMPRESSION: Cholelithiasis with gallstone  adherent in the neck of the gallbladder. Gallbladder wall appears mildly thickened and edematous. These are findings concerning for a degree of acute cholecystitis. Study otherwise unremarkable. Electronically Signed   By: Bretta Bang III M.D.   On: 07/30/2018 11:37    Review of Systems  Constitutional: Positive for chills. Negative for diaphoresis, fever, malaise/fatigue and weight loss.  HENT: Positive for hearing loss (occasional ear ache with cold).   Eyes: Negative.   Respiratory: Negative.   Cardiovascular: Negative.   Gastrointestinal: Positive  for abdominal pain, constipation (chronic 2-4 BM per weeks), nausea and vomiting. Negative for diarrhea and heartburn.  Genitourinary: Positive for frequency and urgency.  Musculoskeletal: Positive for back pain and joint pain (knee pain).  Skin: Negative.   Neurological: Positive for headaches (occasional).  Endo/Heme/Allergies: Negative.   Psychiatric/Behavioral: Positive for depression. The patient is nervous/anxious.     Blood pressure (!) 133/98, pulse 63, temperature 98.5 F (36.9 C), temperature source Oral, resp. rate 16, height 5\' 7"  (1.702 m), weight 95 kg, SpO2 100 %, unknown if currently breastfeeding. Physical Exam  Constitutional: She is oriented to person, place, and time. She appears well-developed and well-nourished. No distress.  HENT:  Head: Normocephalic and atraumatic.  Mouth/Throat: Oropharynx is clear and moist. No oropharyngeal exudate.  Eyes: Right eye exhibits no discharge. Left eye exhibits no discharge. No scleral icterus.  Pupils are equal  Neck: Normal range of motion. Neck supple. No JVD present. No tracheal deviation present. No thyromegaly present.  Cardiovascular: Regular rhythm and intact distal pulses. Exam reveals no gallop.  No murmur heard. tachycardic  Respiratory: Effort normal and breath sounds normal. No respiratory distress. She has no wheezes. She has no rales. She exhibits no tenderness.   GI: Soft. Bowel sounds are normal. She exhibits no distension and no mass. There is abdominal tenderness (RUQ >RLQ  no pain on the left). There is no rebound and no guarding.  Musculoskeletal:        General: No tenderness or edema.  Lymphadenopathy:    She has no cervical adenopathy.  Neurological: She is alert and oriented to person, place, and time. No cranial nerve deficit.  Skin: Skin is warm and dry. No rash noted. She is not diaphoretic. No erythema. No pallor.  Psychiatric: She has a normal mood and affect. Her behavior is normal. Judgment and thought content normal.     Assessment/Plan Acute cholecystitis with cholelithiasis Tobacco use EtOH use Marijuana use BMI 34  Plan: We will admit the patient and schedule her for elective cholecystectomy this afternoon.  Posterior IV fluids and IV antibiotics.  She has not eaten since yesterday. Noelani Harbach, PA-C 07/30/2018, 12:53 PM

## 2018-07-30 NOTE — Transfer of Care (Signed)
Immediate Anesthesia Transfer of Care Note  Patient: Erin Benjamin  Procedure(s) Performed: LAPAROSCOPIC CHOLECYSTECTOMY WITH INTRAOPERATIVE CHOLANGIOGRAM (N/A Abdomen)  Patient Location: PACU  Anesthesia Type:General  Level of Consciousness: drowsy  Airway & Oxygen Therapy: Patient Spontanous Breathing and Patient connected to nasal cannula oxygen  Post-op Assessment: Report given to RN, Post -op Vital signs reviewed and stable and Patient moving all extremities  Post vital signs: Reviewed and stable  Last Vitals:  Vitals Value Taken Time  BP 121/81 07/30/2018  4:29 PM  Temp    Pulse 81 07/30/2018  4:29 PM  Resp 16 07/30/2018  4:29 PM  SpO2 99 % 07/30/2018  4:29 PM  Vitals shown include unvalidated device data.  Last Pain:  Vitals:   07/30/18 0958  TempSrc: Oral  PainSc:          Complications: No apparent anesthesia complications

## 2018-07-30 NOTE — Anesthesia Procedure Notes (Signed)
Procedure Name: MAC Date/Time: 07/30/2018 2:49 PM Performed by: Leonor Liv, CRNA Pre-anesthesia Checklist: Patient identified, Emergency Drugs available, Suction available and Patient being monitored Patient Re-evaluated:Patient Re-evaluated prior to induction Oxygen Delivery Method: Circle system utilized Preoxygenation: Pre-oxygenation with 100% oxygen Induction Type: Rapid sequence and Cricoid Pressure applied Laryngoscope Size: Mac and 3 Grade View: Grade I Tube type: Oral Tube size: 7.0 mm Airway Equipment and Method: Stylet Placement Confirmation: ETT inserted through vocal cords under direct vision,  positive ETCO2 and breath sounds checked- equal and bilateral Secured at: 21 cm Tube secured with: Tape Dental Injury: Teeth and Oropharynx as per pre-operative assessment

## 2018-07-30 NOTE — Progress Notes (Signed)
Pt arrived to floor, A&Ox4. No distress. Good spirits. Ordered regular diet.

## 2018-07-30 NOTE — Anesthesia Preprocedure Evaluation (Signed)
Anesthesia Evaluation  Patient identified by MRN, date of birth, ID band Patient awake    Reviewed: Allergy & Precautions, NPO status , Patient's Chart, lab work & pertinent test results  Airway Mallampati: II  TM Distance: >3 FB Neck ROM: Full    Dental no notable dental hx. (+) Teeth Intact   Pulmonary former smoker,    Pulmonary exam normal breath sounds clear to auscultation       Cardiovascular negative cardio ROS Normal cardiovascular exam Rhythm:Regular Rate:Normal     Neuro/Psych negative neurological ROS  negative psych ROS   GI/Hepatic Neg liver ROS, GERD  Medicated and Controlled,Cholelithiasis with acute cholecystitis   Endo/Other  Obesity  Renal/GU negative Renal ROS  negative genitourinary   Musculoskeletal negative musculoskeletal ROS (+)   Abdominal (+) + obese,  Abdomen: tender.    Peds  Hematology negative hematology ROS (+)   Anesthesia Other Findings   Reproductive/Obstetrics negative OB ROS                             Anesthesia Physical Anesthesia Plan  ASA: II  Anesthesia Plan: General   Post-op Pain Management:    Induction: Intravenous, Rapid sequence and Cricoid pressure planned  PONV Risk Score and Plan: 4 or greater and Scopolamine patch - Pre-op, Midazolam, Dexamethasone, Ondansetron and Treatment may vary due to age or medical condition  Airway Management Planned: Oral ETT  Additional Equipment:   Intra-op Plan:   Post-operative Plan: Extubation in OR  Informed Consent: I have reviewed the patients History and Physical, chart, labs and discussed the procedure including the risks, benefits and alternatives for the proposed anesthesia with the patient or authorized representative who has indicated his/her understanding and acceptance.   Dental advisory given  Plan Discussed with: CRNA and Surgeon  Anesthesia Plan Comments:          Anesthesia Quick Evaluation

## 2018-07-30 NOTE — Op Note (Signed)
Laparoscopic Cholecystectomy with IOC Procedure Note  Indications: This patient presents with symptomatic gallbladder disease and will undergo laparoscopic cholecystectomy. The procedure has been discussed with the patient. Operative and non operative treatments have been discussed. Risks of surgery include bleeding, infection,  Common bile duct injury,  Injury to the stomach,liver, colon,small intestine, abdominal wall,  Diaphragm,  Major blood vessels,  And the need for an open procedure.  Other risks include worsening of medical problems, death,  DVT and pulmonary embolism, and cardiovascular events.   Medical options have also been discussed. The patient has been informed of long term expectations of surgery and non surgical options,  The patient agrees to proceed.    Pre-operative Diagnosis: Calculus of gallbladder with acute cholecystitis, without mention of obstruction  Post-operative Diagnosis: Same  Surgeon: Clovis Pu Massiah Longanecker   Assistants: OR staff  Anesthesia: General endotracheal anesthesia and Local anesthesia 0.25.% bupivacaine, with epinephrine  ASA Class: 2  Procedure Details  The patient was seen again in the Holding Room. The risks, benefits, complications, treatment options, and expected outcomes were discussed with the patient. The possibilities of reaction to medication, pulmonary aspiration, perforation of viscus, bleeding, recurrent infection, finding a normal gallbladder, the need for additional procedures, failure to diagnose a condition, the possible need to convert to an open procedure, and creating a complication requiring transfusion or operation were discussed with the patient. The patient and/or family concurred with the proposed plan, giving informed consent. The site of surgery properly noted/marked. The patient was taken to Operating Room, identified as Erin Benjamin and the procedure verified as Laparoscopic Cholecystectomy with Intraoperative Cholangiograms. A Time  Out was held and the above information confirmed.  Prior to the induction of general anesthesia, antibiotic prophylaxis was administered. General endotracheal anesthesia was then administered and tolerated well. After the induction, the abdomen was prepped in the usual sterile fashion. The patient was positioned in the supine position with the left arm comfortably tucked, along with some reverse Trendelenburg.  Local anesthetic agent was injected into the skin near the umbilicus and an incision made. The midline fascia was incised and the Hasson technique was used to introduce a 12 mm port under direct vision. It was secured with a figure of eight Vicryl suture placed in the usual fashion. Pneumoperitoneum was then created with CO2 and tolerated well without any adverse changes in the patient's vital signs. Additional trocars were introduced under direct vision with an 11 mm trocar in the epigastrium and 2 5 mm trocars in the right upper quadrant. All skin incisions were infiltrated with a local anesthetic agent before making the incision and placing the trocars.   The gallbladder was identified, the fundus grasped and retracted cephalad. Adhesions were lysed bluntly and with the electrocautery where indicated, taking care not to injure any adjacent organs or viscus. The infundibulum was grasped and retracted laterally, exposing the peritoneum overlying the triangle of Calot. This was then divided and exposed in a blunt fashion. The cystic duct was clearly identified and bluntly dissected circumferentially. The junctions of the gallbladder, cystic duct and common bile duct were clearly identified prior to the division of any linear structure.   An incision was made in the cystic duct and the cholangiogram catheter introduced. The catheter was secured using an endoclip. The study showed no stones and good visualization of the distal and proximal biliary tree. The catheter was then removed.   The cystic duct  was then  ligated with surgical clips  on the patient  side and  clipped on the gallbladder side and divided. The cystic artery was identified, dissected free, ligated with clips and divided as well. Posterior cystic artery clipped and divided.  The gallbladder was dissected from the liver bed in retrograde fashion with the electrocautery. The gallbladder was removed. The liver bed was irrigated and inspected. Hemostasis was achieved with the electrocautery. Copious irrigation was utilized and was repeatedly aspirated until clear all particulate matter. Hemostasis was achieved with no signs  Of bleeding or bile leakage.  Pneumoperitoneum was completely reduced after viewing removal of the trocars under direct vision. The wound was thoroughly irrigated and the fascia was then closed with a figure of eight suture; the skin was then closed with 4 O monocryl  and a sterile dressing was applied.  Instrument, sponge, and needle counts were correct at closure and at the conclusion of the case.   Findings: Cholecystitis with Cholelithiasis  Estimated Blood Loss: Minimal         Drains: none         Total IV Fluids: per OR record          Specimens: Gallbladder           Complications: None; patient tolerated the procedure well.         Disposition: PACU - hemodynamically stable.         Condition: stable

## 2018-07-31 ENCOUNTER — Encounter (HOSPITAL_COMMUNITY): Payer: Self-pay | Admitting: Surgery

## 2018-07-31 LAB — HIV ANTIBODY (ROUTINE TESTING W REFLEX): HIV Screen 4th Generation wRfx: NONREACTIVE

## 2018-07-31 MED ORDER — IBUPROFEN 200 MG PO TABS: ORAL_TABLET | ORAL | Status: DC

## 2018-07-31 MED ORDER — ACETAMINOPHEN 500 MG PO TABS: ORAL_TABLET | ORAL | 0 refills | Status: DC

## 2018-07-31 MED ORDER — OXYCODONE HCL 5 MG PO TABS: 5.0000 mg | ORAL_TABLET | ORAL | Status: DC | PRN

## 2018-07-31 MED ORDER — ACETAMINOPHEN 500 MG PO TABS: 1000.0000 mg | ORAL_TABLET | Freq: Three times a day (TID) | ORAL | Status: DC

## 2018-07-31 MED ORDER — NORGESTIMATE-ETH ESTRADIOL 0.25-35 MG-MCG PO TABS: ORAL_TABLET | ORAL | 11 refills | Status: DC

## 2018-07-31 MED ORDER — IBUPROFEN 600 MG PO TABS: 600.0000 mg | ORAL_TABLET | Freq: Four times a day (QID) | ORAL | Status: DC | PRN

## 2018-07-31 MED ORDER — OXYCODONE HCL 5 MG PO TABS: ORAL_TABLET | ORAL | 0 refills | Status: DC

## 2018-07-31 MED ORDER — INFLUENZA VAC SPLIT QUAD 0.5 ML IM SUSY: 0.5000 mL | PREFILLED_SYRINGE | INTRAMUSCULAR | Status: DC | PRN

## 2018-07-31 NOTE — Progress Notes (Signed)
Erin Benjamin to be D/C'd  per MD order. Discussed with the patient and all questions fully answered.  VSS, Skin clean, dry and intact without evidence of skin break down, no evidence of skin tears noted.  IV catheter discontinued intact. Site without signs and symptoms of complications. Dressing and pressure applied.  An After Visit Summary was printed and given to the patient. Patient received prescription.  D/c education completed with patient/family including follow up instructions, medication list, d/c activities limitations if indicated, with other d/c instructions as indicated by MD - patient able to verbalize understanding, all questions fully answered.   Patient instructed to return to ED, call 911, or call MD for any changes in condition.   Patient to be escorted via WC, and D/C home via private auto.

## 2018-07-31 NOTE — Discharge Summary (Signed)
Physician Discharge Summary  Patient ID: Erin Benjamin MRN: [id number] DOB/AGE: Feb 11, 1991 27 y.o.  Admit date: 07/30/2018 Discharge date: 07/31/2018  Admission Diagnoses:  Acute cholecystitis/cholelithiasis Tobacco use EtOH use Marijuana use BMI 34  Discharge Diagnoses:  Acute cholecystitis/cholelithiasis Tobacco use EtOH use Marijuana use BMI 34   Active Problems:   Cholelithiasis with acute cholecystitis   PROCEDURES:Laparoscopic cholecystectomy with intraoperative cholangiogram, 07[facility]30/2018[facility]  Hospital Course:  Patient is a 27 year old female who was seen yesterday in the ED was 6 to 12 hours of abdominal pain, worse after food intake and alcohol use pain was located in the mid epigastric area she described as 10/10. Labs yesterday showed a normal CMP, WBC 11,000, hemoglobin hematocrit were normal.  Platelets were normal.  Lipase was 40.  Urinalysis was unremarkable.  No imaging was performed yesterday.  Patient improved to the ED with IV pain medications and antiemetics.  She was discharged with viscous lidocaine, Maalox and Protonix.  She reports her pain became worse at home she never got better.  She was unable to eat she had nausea and vomiting along with ongoing abdominal pain mostly in the right upper quadrant.  She returned this a.m with ongoing right upper quadrant abdominal pain, going to her back.  Nausea, vomiting, chills, unable to tolerate anything p.o.  Work-up in the ED today shows she is afebrile blood pressure is elevated 133/98.  No tachycardia.  WBC is up to 15.2, hemoglobin 12.9, hematocrit 41.4, platelets 308,000.  CMP remains normal.  Abdominal ultrasound was obtained this shows cholelithiasis with the largest gallstone measuring 1.2 cm.  A 1.1 cm calculus is in the neck of the gallbladder.  There is slight gallbladder wall thickening and subtle gallbladder wall edema no pericholecystic fluid evident.  Common bile duct was 3 mm with no  intrahepatic or extrahepatic biliary dilatation.  We are asked to see. Pt was seen and admitted in the ED.  Dr. Luisa Hartornett took her to the OR and she had the above noted surgery.  She did well and was tolerating a regular diet the first post op morning.  Her port sites looked fine and she was discharged home the first post op morning.    CBC Latest Ref Rng & Units 07/30/2018 07/29/2018 07/29/2018  WBC 4.0 - 10.5 K/uL 15.2(H) - 11.0(H)  Hemoglobin 12.0 - 15.0 g/dL 40.912.9 81.113.9 91.412.5  Hematocrit 36.0 - 46.0 % 41.4 41.0 41.9  Platelets 150 - 400 K/uL 308 - 355   CMP Latest Ref Rng & Units 07/30/2018 07/29/2018 07/29/2018  Glucose 70 - 99 mg/dL 782(N114(H) 562(Z116(H) 308(M123(H)  BUN 6 - 20 mg/dL <5(H<5(L) 14 12  Creatinine 0.44 - 1.00 mg/dL 8.460.94 9.620.70 9.520.79  Sodium 135 - 145 mmol/L 135 138 138  Potassium 3.5 - 5.1 mmol/L 3.7 3.4(L) 3.5  Chloride 98 - 111 mmol/L 101 103 102  CO2 22 - 32 mmol/L 24 - 24  Calcium 8.9 - 10.3 mg/dL 8.4(X8.7(L) - 8.7(L)  Total Protein 6.5 - 8.1 g/dL 6.9 - 6.6  Total Bilirubin 0.3 - 1.2 mg/dL 1.1 - 0.6  Alkaline Phos 38 - 126 U/L 52 - 50  AST 15 - 41 U/L 31 - 22  ALT 0 - 44 U/L 24 - 19  ultrasound 07/30/18:  Cholelithiasis with gallstone adherent in the neck of the gallbladder. Gallbladder wall appears mildly thickened and edematous. These are findings concerning for a degree of acute Cholecystitis.   Condition on DC:  Improved   Disposition: Discharge disposition: 01-Home  or Self Care        Allergies as of 07/31/2018   No Known Allergies     Medication List    STOP taking these medications   famotidine 20 MG tablet Commonly known as:  PEPCID     TAKE these medications   acetaminophen 500 MG tablet Commonly known as:  TYLENOL You can take 1000 mg of plain Tylenol(acetaminophen) every 8 hours as needed for pain.  You can buy this over-the-counter at any drugstore without a prescription.  Not exceed 4000 mg of Tylenol per day it can harm your liver.   ibuprofen 200 MG  tablet Commonly known as:  ADVIL,MOTRIN You can take 2 to 3 tablets every 6 hours as needed for pain.  This should be your second drug, if plain Tylenol is ineffective.  You can buy this over-the-counter at any drugstore without a prescription.   NEXPLANON [place] Inject 1 Device into the skin once.   norgestimate-ethinyl estradiol 0.25-35 MG-MCG tablet Commonly known as:  ORTHO-CYCLEN,SPRINTEC,PREVIFEM Use birth control as prescribed prior to admission. What changed:    how much to take  how to take this  when to take this  additional instructions   oxyCODONE 5 MG immediate release tablet Commonly known as:  Oxy IR/ROXICODONE You can take 5 mg every 6 hours as needed for pain.  You can use this if plain Tylenol and ibuprofen are ineffective.  We will not refill this prescription.      Follow-up Information    [facility], [place] Follow up on 08/14/2018.   Specialty:  General Surgery Why:  Your appointment is at 10 AM.  Be at the office 30 minutes early for check-in.  Bring photo ID and insurance information. Contact information: [address] [place] [address] [phone]           Signed: Sherrie George 07/31/2018, 1:23 PM

## 2018-07-31 NOTE — Progress Notes (Signed)
1 Day Post-Op    CC: Right upper quadrant pain  Subjective: Patient sitting up she feels much better.  She is tolerating all the oral stuff she is taken in.  I told her when she could eat, drink, walk, and void she could go home. Port sites all look fine. Objective: Vital signs in last 24 hours: Temp:  [97.5 F (36.4 C)-98.5 F (36.9 C)] 98 F (36.7 C) (12/17 0640) Pulse Rate:  [46-87] 46 (12/17 0640) Resp:  [15-21] 19 (12/17 0640) BP: (95-146)/(59-99) 95/59 (12/17 0640) SpO2:  [95 %-100 %] 99 % (12/17 0640) Weight:  [95 kg-99.1 kg] 99.1 kg (12/16 1951) Last BM Date: 07/30/18 P.o. 720 IV 2500 Voided x2 Afebrile vital signs are stable No labs this a.m. Intake/Output from previous day: 12/16 0701 - 12/17 0700 In: 3115.5 [P.O.:720; I.V.:1295.5; IV Piggyback:1100] Out: 10 [Blood:10] Intake/Output this shift: No intake/output data recorded.  General appearance: alert, cooperative and no distress Resp: clear to auscultation bilaterally GI: Soft, sore, port sites look fine.  Tolerating soft diet.  Lab Results:  Recent Labs    07/29/18 1612 07/29/18 1627 07/30/18 1027  WBC 11.0*  --  15.2*  HGB 12.5 13.9 12.9  HCT 41.9 41.0 41.4  PLT 355  --  308    BMET Recent Labs    07/29/18 1612 07/29/18 1627 07/30/18 1027  NA 138 138 135  K 3.5 3.4* 3.7  CL 102 103 101  CO2 24  --  24  GLUCOSE 123* 116* 114*  BUN 12 14 <5*  CREATININE 0.79 0.70 0.94  CALCIUM 8.7*  --  8.7*   PT/INR No results for input(s): LABPROT, INR in the last 72 hours.  Recent Labs  Lab 07/29/18 1612 07/30/18 1027  AST 22 31  ALT 19 24  ALKPHOS 50 52  BILITOT 0.6 1.1  PROT 6.6 6.9  ALBUMIN 3.6 3.6     Lipase     Component Value Date/Time   LIPASE 44 07/30/2018 1027   . 0.9 % NaCl with KCl 20 mEq / L 125 mL/hr at 07/31/18 0744  . cefTRIAXone (ROCEPHIN)  IV 2 g (07/31/18 0051)     Medications:   Assessment/Plan Tobacco use EtOH use Marijuana use BMI 34  Acute  cholecystitis/cholelithiasis Laparoscopic cholecystectomy with intraoperative cholangiogram, 07/30/2018, Dr. Maisie Fushomas Cornett  FEN: IV fluids/regular diet ID: Rocephin preop DVT: SCDs Follow-up: [facility]  Plan: Discharge home follow-up in the [facility]. I have personally reviewed the patients medication history on the [place] controlled substance database.  LOS: 0 days    Karlita Lichtman 07/31/2018 [phone]

## 2018-07-31 NOTE — Anesthesia Postprocedure Evaluation (Signed)
Anesthesia Post Note  Patient: Erin Benjamin  Procedure(s) Performed: LAPAROSCOPIC CHOLECYSTECTOMY WITH INTRAOPERATIVE CHOLANGIOGRAM (N/A Abdomen)     Patient location during evaluation: PACU Anesthesia Type: General Level of consciousness: awake and alert and oriented Pain management: pain level controlled Vital Signs Assessment: post-procedure vital signs reviewed and stable Respiratory status: spontaneous breathing, nonlabored ventilation and respiratory function stable Cardiovascular status: blood pressure returned to baseline and stable Postop Assessment: no apparent nausea or vomiting Anesthetic complications: no                  Bernard Donahoo A.

## 2019-01-09 IMAGING — US US OB COMP LESS 14 WK
1 series · 15 of 28 positions shown · non-contrast
Comparison: None.

CLINICAL DATA: Abdominal and back pain with vaginal bleeding.
Positive pregnancy test.

EXAM:
OBSTETRIC <14 WK US AND TRANSVAGINAL OB US
TECHNIQUE: Both transabdominal and transvaginal ultrasound examinations were
performed for complete evaluation of the gestation as well as the
maternal uterus, adnexal regions, and pelvic cul-de-sac.
Transvaginal technique was performed to assess early pregnancy.

[Series 1: us ob comp less 14 wk · 15 of 62 slices shown]
[im 1/62]
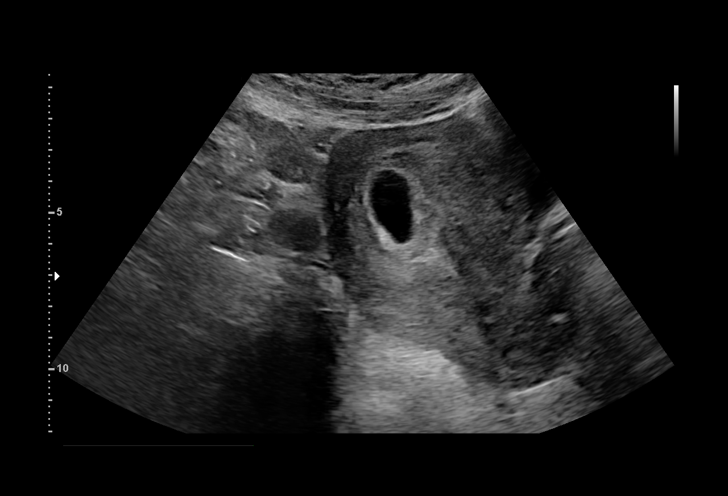
[im 5/62]
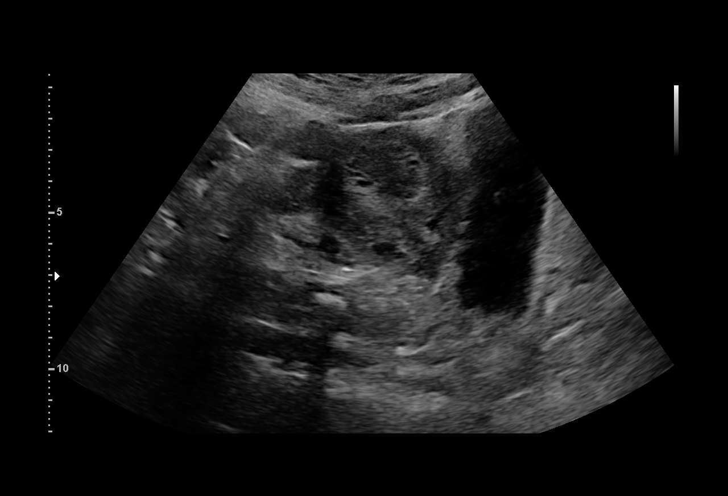
[im 10/62]
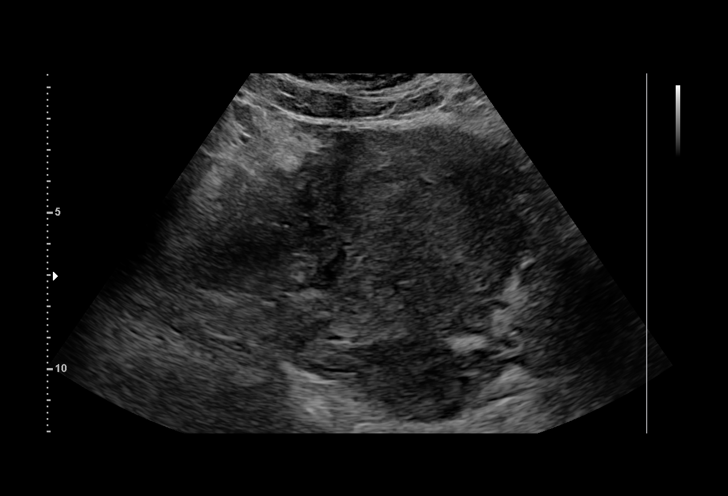
[im 14/62]
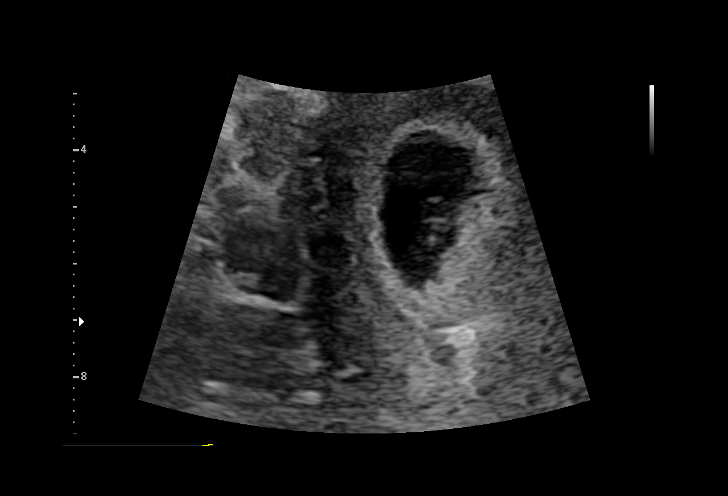
[im 19/62]
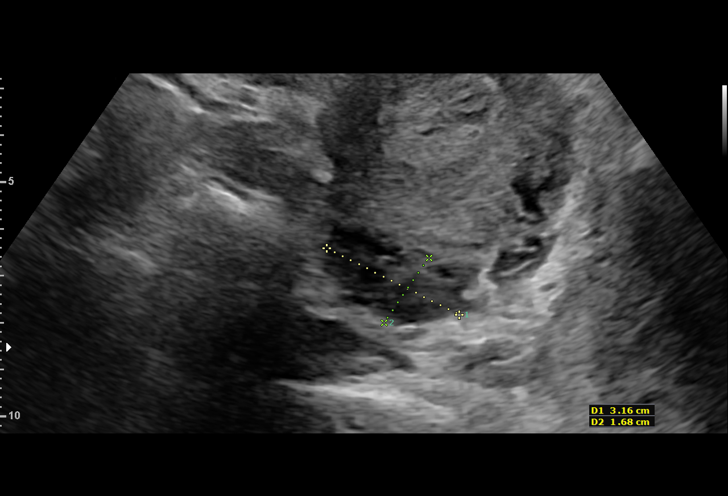
[im 23/62]
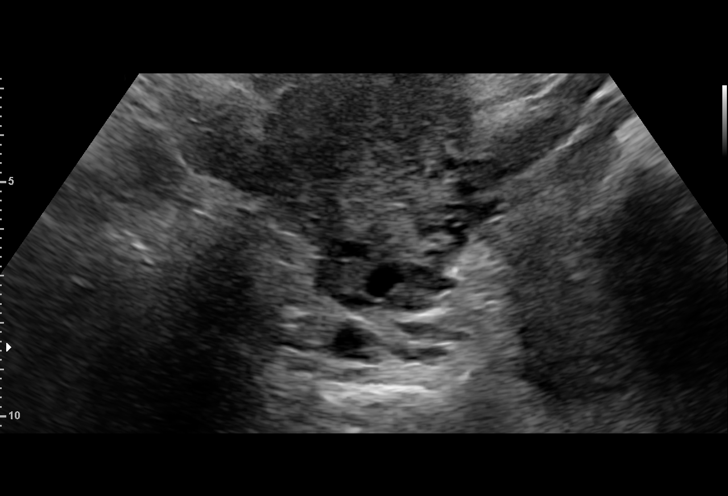
[im 28/62]
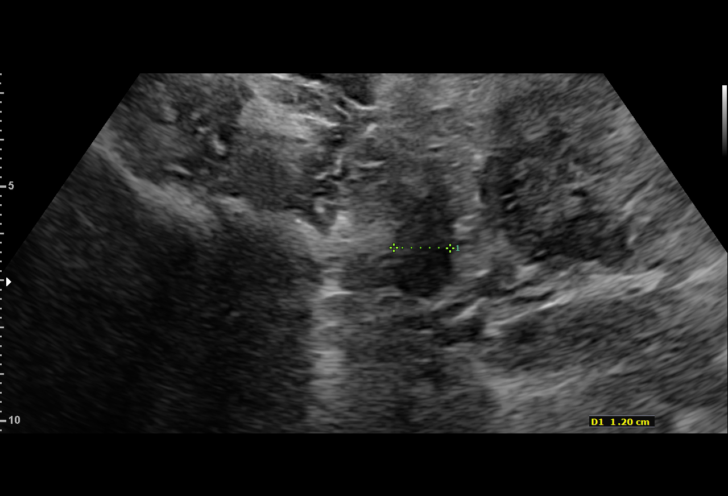
[im 32/62]
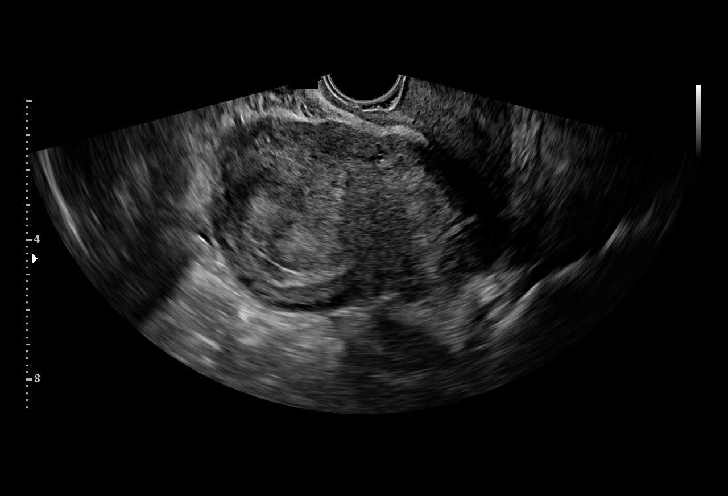
[im 34/62]
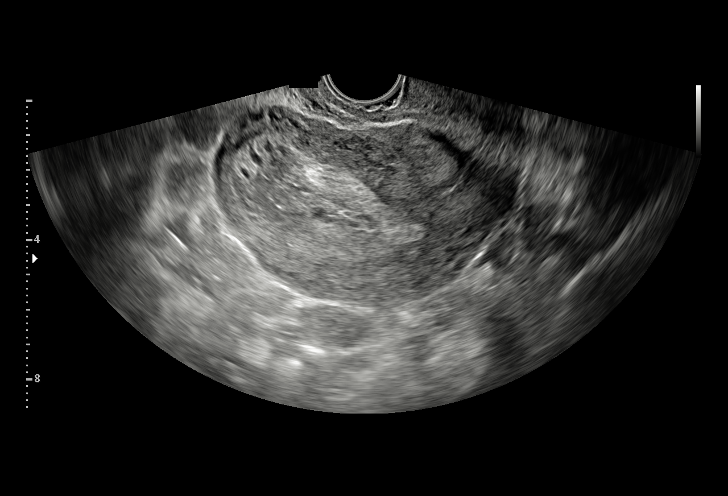
[im 39/62]
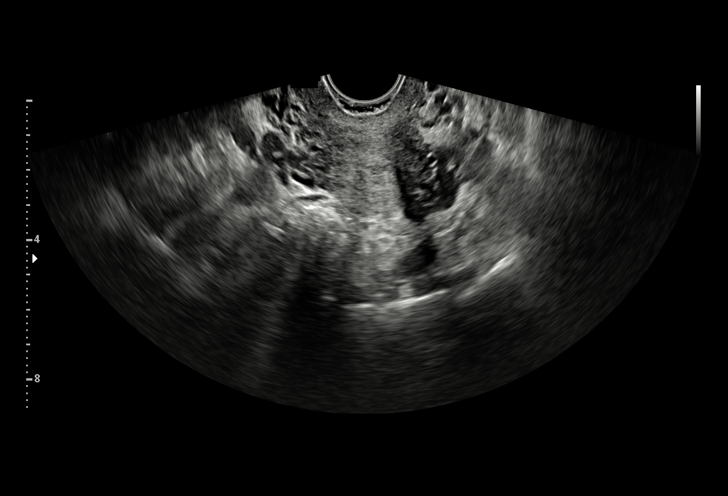
[im 43/62]
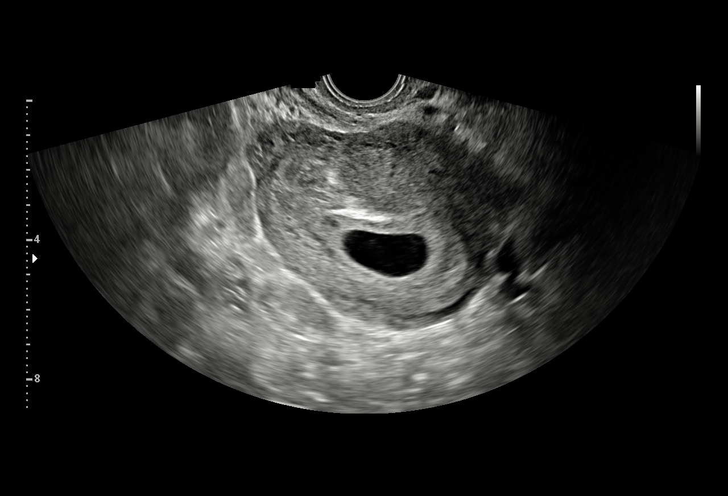
[im 48/62]
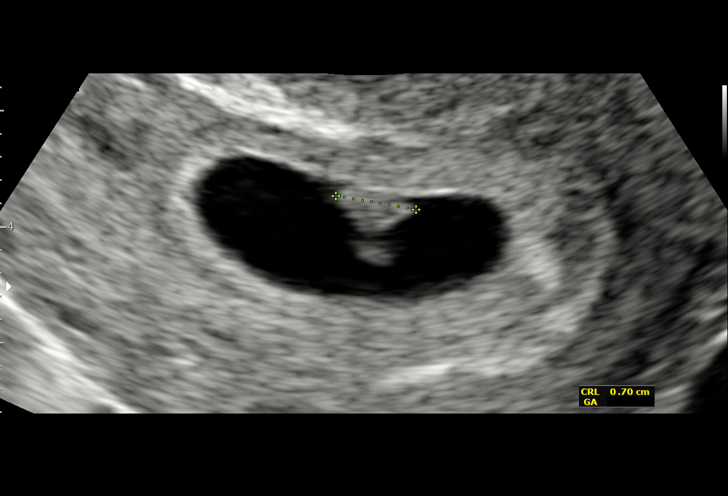
[im 52/62]
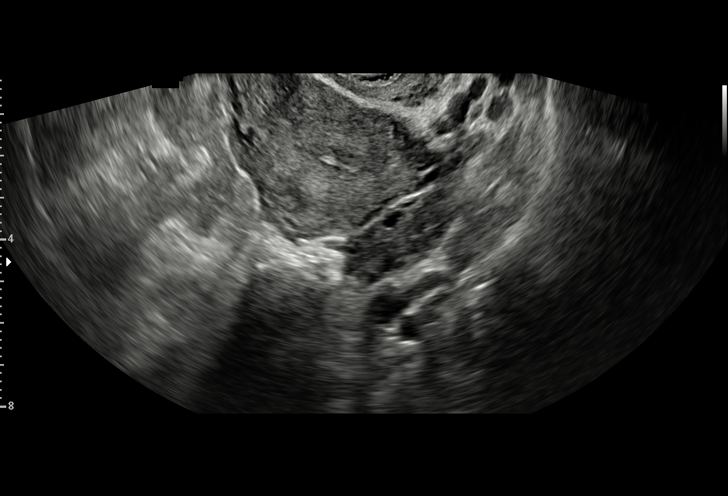
[im 57/62]
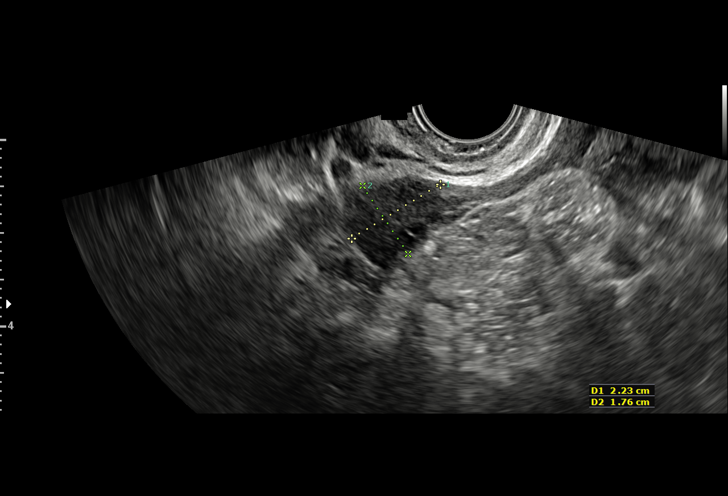
[im 62/62]
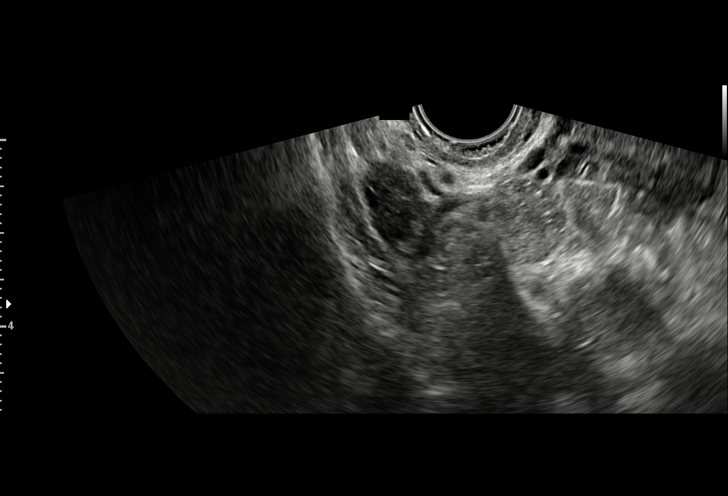

[15 of 28 positions shown; findings below may reference images not displayed]

FINDINGS: Intrauterine gestational sac: Single.

Yolk sac:  Visualized.

Embryo:  Visualized.

Cardiac Activity: Visualized.

Heart Rate: 128  bpm

CRL:  6 mm  mm   6 w   3 d                  US EDC: 11/17/2017

Subchorionic hemorrhage:  None visualized.

Maternal uterus/adnexae: Unremarkable.
IMPRESSION: Single living intrauterine gestation at estimated 6 week 3 day
gestational age by crown-rump length.

## 2019-04-02 ENCOUNTER — Telehealth: Payer: Self-pay | Admitting: Medical

## 2019-04-02 NOTE — Telephone Encounter (Signed)
Called the patient and left a detailed voicemail of the upcoming appointment. Informed the patient if she has been diagnosed with [phone], in close contact with someone who has had covid19, or experienced any flu-like symptoms such as fever, rash, sore throat, or shortness of breath please call our office to reschedule the appointment

## 2019-04-03 ENCOUNTER — Other Ambulatory Visit: Payer: Self-pay

## 2019-04-03 ENCOUNTER — Ambulatory Visit (INDEPENDENT_AMBULATORY_CARE_PROVIDER_SITE_OTHER): Payer: Medicaid Other | Admitting: Medical

## 2019-04-03 ENCOUNTER — Encounter: Payer: Self-pay | Admitting: Medical

## 2019-04-03 VITALS — BP 115/83 | HR 74 | Temp 99.1°F | Ht 68.0 in | Wt 210.5 lb

## 2019-04-03 DIAGNOSIS — Z3046 Encounter for surveillance of implantable subdermal contraceptive: Secondary | ICD-10-CM | POA: Diagnosis not present

## 2019-04-03 NOTE — Progress Notes (Signed)
GYNECOLOGY CLINIC PROCEDURE NOTE  Ms. Erin Benjamin is a 28 y.o. [id number] here for Nexplanon removal. She has had irregular prolonged bleeding, headaches and mood swings with the Nexplanon. No GYN concerns.  Last pap smear was on 05/2017 and was normal.  No other gynecologic concerns.  Nexplanon Removal Patient was given informed consent for removal of her Nexplanon.  Appropriate time out taken. Nexplanon site identified.  Area prepped in usual sterile fashon. One ml of 1% lidocaine was used to anesthetize the area at the distal end of the implant. A small stab incision was made right beside the implant on the distal portion.  The Nexplanon rod was grasped using hemostats and removed without difficulty.  There was minimal blood loss. There were no complications.  A small amount of antibiotic ointment and steri-strips were applied over the small incision.  A pressure bandage was applied to reduce any bruising.  The patient tolerated the procedure well and was given post procedure instructions.  Patient is planning to use condoms for contraception. List of birth control options given and patient will call office if she decides she would like to trial another method.     Luvenia Redden, PA-C 04/03/2019 2:51 PM

## 2019-04-03 NOTE — Patient Instructions (Signed)

## 2019-04-04 ENCOUNTER — Encounter: Payer: Self-pay | Admitting: General Practice

## 2019-05-06 ENCOUNTER — Ambulatory Visit (INDEPENDENT_AMBULATORY_CARE_PROVIDER_SITE_OTHER): Payer: Medicaid Other | Admitting: Women's Health

## 2019-05-06 ENCOUNTER — Other Ambulatory Visit (HOSPITAL_COMMUNITY)
Admission: RE | Admit: 2019-05-06 | Discharge: 2019-05-06 | Disposition: A | Discharge status: Home / Self Care | Payer: Medicaid Other | Source: Ambulatory Visit | Attending: Women's Health | Admitting: Women's Health

## 2019-05-06 ENCOUNTER — Other Ambulatory Visit: Payer: Self-pay | Admitting: Women's Health

## 2019-05-06 ENCOUNTER — Encounter: Payer: Self-pay | Admitting: Women's Health

## 2019-05-06 ENCOUNTER — Other Ambulatory Visit: Payer: Self-pay

## 2019-05-06 VITALS — BP 125/89 | HR 76 | Wt 209.8 lb

## 2019-05-06 DIAGNOSIS — Z01419 Encounter for gynecological examination (general) (routine) without abnormal findings: Secondary | ICD-10-CM

## 2019-05-06 DIAGNOSIS — Z0000 Encounter for general adult medical examination without abnormal findings: Secondary | ICD-10-CM | POA: Diagnosis not present

## 2019-05-06 DIAGNOSIS — Z202 Contact with and (suspected) exposure to infections with a predominantly sexual mode of transmission: Secondary | ICD-10-CM | POA: Insufficient documentation

## 2019-05-06 LAB — POCT URINALYSIS DIP (DEVICE)
Bilirubin Urine: NEGATIVE
Glucose, UA: NEGATIVE mg/dL
Ketones, ur: NEGATIVE mg/dL
Leukocytes,Ua: NEGATIVE
Nitrite: NEGATIVE
Protein, ur: NEGATIVE mg/dL
Specific Gravity, Urine: >=1.03 (ref 1.005–1.030)
Urobilinogen, UA: 0.2 mg/dL (ref 0.0–1.0)
pH: 7 (ref 5.0–8.0)

## 2019-05-06 LAB — POCT PREGNANCY, URINE: Preg Test, Ur: NEGATIVE

## 2019-05-06 NOTE — Progress Notes (Signed)
History:  Ms. Mariha Sleeper is a 28 y.o. G2P2002 who presents to clinic today for annual exam. Pt requests STD testing today. Pt declines any known exposures, just states she "likes to have it done every year."  Pt also reports she is concerned that one of her breasts is larger than the other, but reports this has been this way since puberty. Pt denies lumps, nipple changes/discharge, skin changes. Pt reports hx of breast cancer in maternal aunt about age 76.  Pt reports removal of Nexplanon in August 2020 as was unhappy with side effects. Pt and partner are using condoms for birth control. Pt reports she is happy using condoms.  Pt also reports pain with urination, occasionally, only after holding her urine for a long time.  The following portions of the patient's history were reviewed and updated as appropriate: allergies, current medications, family history, past medical history, social history, past surgical history and problem list.  Review of Systems:  Review of Systems  Constitutional: Negative for chills, fever and weight loss.  Respiratory: Negative for shortness of breath.   Cardiovascular: Negative for chest pain.  Gastrointestinal: Negative for abdominal pain, constipation, diarrhea, nausea and vomiting.  Genitourinary: Negative for dysuria, frequency and urgency.  Neurological: Negative for dizziness, weakness and headaches.      Objective:  Physical Exam BP 125/89   Pulse 76   Wt 209 lb 12.8 oz (95.2 kg)   BMI 31.90 kg/m  Physical Exam  Constitutional: She is oriented to person, place, and time. She appears well-developed and well-nourished. No distress.  HENT:  Head: Normocephalic and atraumatic.  Cardiovascular: Normal rate, regular rhythm and normal heart sounds.  Respiratory: Effort normal and breath sounds normal.  GI: Soft. Bowel sounds are normal. She exhibits no distension and no mass. There is no abdominal tenderness. There is no rebound and no guarding.   Neurological: She is alert and oriented to person, place, and time.  Skin: Skin is warm and dry. She is not diaphoretic.  Psychiatric: She has a normal mood and affect. Her behavior is normal. Judgment and thought content normal.  Pt declines pelvic exam today d/t being on period.  Labs and Imaging Results for orders placed or performed in visit on 05/06/19 (from the past 24 hour(s))  POCT urinalysis dip (device)     Status: Abnormal   Collection Time: 05/06/19  6:33 PM  Result Value Ref Range   Glucose, UA NEGATIVE NEGATIVE mg/dL   Bilirubin Urine NEGATIVE NEGATIVE   Ketones, ur NEGATIVE NEGATIVE mg/dL   Specific Gravity, Urine >=1.030 1.005 - 1.030   Hgb urine dipstick LARGE (A) NEGATIVE   pH 7.0 5.0 - 8.0   Protein, ur NEGATIVE NEGATIVE mg/dL   Urobilinogen, UA 0.2 0.0 - 1.0 mg/dL   Nitrite NEGATIVE NEGATIVE   Leukocytes,Ua NEGATIVE NEGATIVE  Pregnancy, urine POC     Status: None   Collection Time: 05/06/19  6:37 PM  Result Value Ref Range   Preg Test, Ur NEGATIVE NEGATIVE  Pt reports her period started yesterday and she is having vaginal bleeding today from that.  No results found.  Assessment & Plan:   1. Women's annual routine gynecological examination -discussed normal abnormalities in breast size/shape -discussed s/sx of breast cancer and when to return to clinic for exam -pt advised to return in 1 year for repeat Pap, advised not needed today d/t normal Pap 05/2017 and no history of abnormal paps -pt advised not to hold urine, discussed normal hydration and frequency  of urination -UA WNL except for dehydration for pt with current period -RTC PRN for contraception counseling  2. STD exposure - Cervicovaginal ancillary only ([employer]) - HIV antibody (with reflex) - RPR - Hepatitis B Surface AntiGEN - Hepatitis C Antibody  Curley Hogen, Odie Sera, NP 05/06/2019 7:05 PM

## 2019-05-06 NOTE — Patient Instructions (Signed)
Breast Self-Awareness Breast self-awareness means being familiar with how your breasts look and feel. It involves checking your breasts regularly and reporting any changes to your health care provider. Practicing breast self-awareness is important. Sometimes changes Manalo not be harmful (are benign), but sometimes a change in your breasts can be a sign of a serious medical problem. It is important to learn how to do this procedure correctly so that you can catch problems early, when treatment is more likely to be successful. All women should practice breast self-awareness, including women who have had breast implants. What you need:  A mirror.  A well-lit room. How to do a breast self-exam A breast self-exam is one way to learn what is normal for your breasts and whether your breasts are changing. To do a breast self-exam: Look for changes  1. Remove all the clothing above your waist. 2. Stand in front of a mirror in a room with good lighting. 3. Put your hands on your hips. 4. Push your hands firmly downward. 5. Compare your breasts in the mirror. Look for differences between them (asymmetry), such as: ? Differences in shape. ? Differences in size. ? Puckers, dips, and bumps in one breast and not the other. 6. Look at each breast for changes in the skin, such as: ? Redness. ? Scaly areas. 7. Look for changes in your nipples, such as: ? Discharge. ? Bleeding. ? Dimpling. ? Redness. ? A change in position. Feel for changes Carefully feel your breasts for lumps and changes. It is best to do this while lying on your back on the floor, and again while sitting or standing in the tub or shower with soapy water on your skin. Feel each breast in the following way: 1. Place the arm on the side of the breast you are examining above your head. 2. Feel your breast with the other hand. 3. Start in the nipple area and make -inch (2 cm) overlapping circles to feel your breast. Use the pads of your  three middle fingers to do this. Apply light pressure, then medium pressure, then firm pressure. The light pressure will allow you to feel the tissue closest to the skin. The medium pressure will allow you to feel the tissue that is a little deeper. The firm pressure will allow you to feel the tissue close to the ribs. 4. Continue the overlapping circles, moving downward over the breast until you feel your ribs below your breast. 5. Move one finger-width toward the center of the body. Continue to use the -inch (2 cm) overlapping circles to feel your breast as you move slowly up toward your collarbone. 6. Continue the up-and-down exam using all three pressures until you reach your armpit.  Write down what you find Writing down what you find can help you remember what to discuss with your health care provider. Write down:  What is normal for each breast.  Any changes that you find in each breast, including: ? The kind of changes you find. ? Any pain or tenderness. ? Size and location of any lumps.  Where you are in your menstrual cycle, if you are still menstruating. General tips and recommendations  Examine your breasts every month.  If you are breastfeeding, the best time to examine your breasts is after a feeding or after using a breast pump.  If you menstruate, the best time to examine your breasts is 5-7 days after your period. Breasts are generally lumpier during menstrual periods, and it Baldi  be more difficult to notice changes.  With time and practice, you will become more familiar with the variations in your breasts and more comfortable with the exam. Contact a health care provider if you:  See a change in the shape or size of your breasts or nipples.  See a change in the skin of your breast or nipples, such as a reddened or scaly area.  Have unusual discharge from your nipples.  Find a lump or thick area that was not there before.  Have pain in your breasts.  Have any  concerns related to your breast health. Summary  Breast self-awareness includes looking for physical changes in your breasts, as well as feeling for any changes within your breasts.  Breast self-awareness should be performed in front of a mirror in a well-lit room.  You should examine your breasts every month. If you menstruate, the best time to examine your breasts is 5-7 days after your menstrual period.  Let your health care provider know of any changes you notice in your breasts, including changes in size, changes on the skin, pain or tenderness, or unusual fluid from your nipples. This information is not intended to replace advice given to you by your health care provider. Make sure you discuss any questions you have with your health care provider. Document Released: 08/01/2005 Document Revised: 03/20/2018 Document Reviewed: 03/20/2018 Elsevier Patient Education  2020 [employer]. How to Use a Condom Correctly, Adult Using a condom correctly and consistently is important for preventing pregnancy and the spread of sexually transmitted diseases (STDs). Condoms work by blocking contact with bodily fluids that can result in pregnancy or spread infection. This is called the barrier method. What are the different types of condoms? There are both female and female condoms. A female condom is a thin sheath that fits over an erect penis. Female condoms can be made from different materials, including:  Latex.  Polyurethane. This is a type of plastic.  Synthetic rubber.  Lambskin or other natural membranes. Female condoms can be lubricated or unlubricated. A female condom is a thin pouch inserted into the vagina. An inner ring holds the condom in place. Another ring covers the outer folds of skin (labia). Female condoms are made from a rubber-like substance (nitrile). What do condoms prevent? Condoms can effectively prevent:  Pregnancy.  STDs that are transmitted through genital fluids. These  include: ? HIV and AIDS. ? Gonorrhea. ? Chlamydia. ? Hepatitis B and C. Condoms also offer some protection from STDs that are transmitted through skin-to-skin contact if the infected area is covered by the condom. These infections include:  Syphilis.  Genital herpes.  Human papillomavirus (HPV).  Trichomoniasis. Condoms made from lambskin or other natural membranes are not as effective at preventing STDs because some germs can pass through them. How do I use a condom? Female condom   Store condoms in a cool, dry place.  Before using a condom: ? Check the package to make sure the expiration date has not passed. ? Make sure that both the package and the condom do not have any holes, rips, or tears in them. ? Make sure that the condom is not brittle or discolored.  Make sure the condom is ready to be put on the right way. It should be ready to unroll downward.  Place the condom over your erect penis before engaging in any contact with your partner's mouth, anus, or vagina.  Pinch the tip of the condomwhile rolling it down to the  base of the penis so that the entire penis is covered.  You can use water-based or silicone-based lubricants with female condoms. Do not use oil-based lubricants.  After you ejaculate, hold the rim of the condom as you withdraw.  Carefully pull off the condom away from your partner and be careful to avoid spills.  Wrap the condom in tissue or toilet paper and discard it in a trash can. Do not flush it down the toilet.  Do not use the same condom more than once. Use a new condom every time you have sex. Female condom   Store condoms in a cool, dry place.  Before using a condom: ? Check the package to make sure the expiration date has not passed. ? Make sure that both the package and the condom do not have any holes, rips, or tears in them. ? Make sure that the condom is not brittle or discolored.  Place the condom inside your vagina before engaging  in any contact with your partner's penis. To do this: ? Squeeze the inner ring and insert it into the vagina like a tampon. ? Use your index finger to push it into place. There should be about one inch of condom outside of the vagina to expand during sex. ? Make sure the outer part of the condom completely covers the labia.  Female condoms are already lubricated. You can also use water-based or silicone-based lubricants with female condoms. Do not use oil-based lubricants.  Your partner should withdraw his penis shortly after ejaculating. Before you stand up, grasp the condom. Twist the outer part slightly to hold in fluid and carefully remove it.  Your partner can also grasp the condom and remove it at the same time he withdraws his penis.  Wrap the condom in tissue or toilet paper and discard it in a trash can. Do not flush it down the toilet.  Do not use the same condom more than once. Use a new condom every time you have sex. Where can I get more information? Learn more about how to use a condom correctly from:  Centers for Disease Control and Prevention: [employer]  http://www.dixon-collier.info/: [employer].com.au  Planned Parenthood: https://www.plannedparenthood.org/learn/birth-control/condom/how-to-put-a-condom-on This information is not intended to replace advice given to you by your health care provider. Make sure you discuss any questions you have with your health care provider. Document Released: 08/28/2015 Document Revised: 11/26/2018 Document Reviewed: 04/13/2016 Elsevier Patient Education  2020 [employer].

## 2019-05-07 LAB — HIV ANTIBODY (ROUTINE TESTING W REFLEX): HIV Screen 4th Generation wRfx: NONREACTIVE

## 2019-05-07 LAB — HEPATITIS C ANTIBODY: Hep C Virus Ab: <0.1 s/co ratio (ref 0.0–0.9)

## 2019-05-07 LAB — HEPATITIS B SURFACE ANTIGEN: Hepatitis B Surface Ag: NEGATIVE

## 2019-05-07 LAB — RPR: RPR Ser Ql: NONREACTIVE

## 2019-05-08 LAB — CERVICOVAGINAL ANCILLARY ONLY
Bacterial Vaginitis (gardnerella): NEGATIVE
Candida Glabrata: NEGATIVE
Candida Vaginitis: NEGATIVE
Molecular Disclaimer: NEGATIVE
Molecular Disclaimer: NEGATIVE
Molecular Disclaimer: NEGATIVE
Molecular Disclaimer: NORMAL
Trichomonas: NEGATIVE

## 2019-05-10 LAB — CERVICOVAGINAL ANCILLARY ONLY
Chlamydia: NEGATIVE
Neisseria Gonorrhea: NEGATIVE

## 2019-11-10 ENCOUNTER — Other Ambulatory Visit: Payer: Self-pay

## 2019-11-10 ENCOUNTER — Encounter (HOSPITAL_COMMUNITY): Payer: Self-pay | Admitting: Emergency Medicine

## 2019-11-10 ENCOUNTER — Emergency Department (HOSPITAL_COMMUNITY): Payer: Self-pay

## 2019-11-10 ENCOUNTER — Inpatient Hospital Stay (HOSPITAL_COMMUNITY)
Admission: EM | Admit: 2019-11-10 | Discharge: 2019-11-13 | DRG: 392 | Disposition: A | Discharge status: Home / Self Care | Payer: Self-pay | Attending: Surgery | Admitting: Surgery

## 2019-11-10 DIAGNOSIS — K5900 Constipation, unspecified: Secondary | ICD-10-CM | POA: Diagnosis present

## 2019-11-10 DIAGNOSIS — K5792 Diverticulitis of intestine, part unspecified, without perforation or abscess without bleeding: Secondary | ICD-10-CM

## 2019-11-10 DIAGNOSIS — Z20822 Contact with and (suspected) exposure to covid-19: Secondary | ICD-10-CM | POA: Diagnosis present

## 2019-11-10 DIAGNOSIS — K5720 Diverticulitis of large intestine with perforation and abscess without bleeding: Principal | ICD-10-CM | POA: Diagnosis present

## 2019-11-10 DIAGNOSIS — Z87891 Personal history of nicotine dependence: Secondary | ICD-10-CM

## 2019-11-10 HISTORY — DX: Diverticulitis of large intestine with perforation and abscess without bleeding: K57.20

## 2019-11-10 LAB — I-STAT BETA HCG BLOOD, ED (MC, WL, AP ONLY): I-stat hCG, quantitative: <5 m[IU]/mL (ref <5)

## 2019-11-10 LAB — LIPASE, BLOOD: Lipase: 27 U/L (ref 11–51)

## 2019-11-10 LAB — CBC
HCT: 43.6 % (ref 36.0–46.0)
Hemoglobin: 14.3 g/dL (ref 12.0–15.0)
MCH: 30.7 pg (ref 26.0–34.0)
MCHC: 32.8 g/dL (ref 30.0–36.0)
MCV: 93.6 fL (ref 80.0–100.0)
Platelets: 284 10*3/uL (ref 150–400)
RBC: 4.66 MIL/uL (ref 3.87–5.11)
RDW: 12.1 % (ref 11.5–15.5)
WBC: 17.3 10*3/uL — ABNORMAL HIGH (ref 4.0–10.5)
nRBC: 0 % (ref 0.0–0.2)

## 2019-11-10 LAB — COMPREHENSIVE METABOLIC PANEL
ALT: 21 U/L (ref 0–44)
AST: 29 U/L (ref 15–41)
Albumin: 3.6 g/dL (ref 3.5–5.0)
Alkaline Phosphatase: 67 U/L (ref 38–126)
Anion gap: 10 (ref 5–15)
BUN: 6 mg/dL (ref 6–20)
CO2: 24 mmol/L (ref 22–32)
Calcium: 9.1 mg/dL (ref 8.9–10.3)
Chloride: 102 mmol/L (ref 98–111)
Creatinine, Ser: 0.69 mg/dL (ref 0.44–1.00)
GFR calc Af Amer: >60 mL/min (ref >60)
GFR calc non Af Amer: >60 mL/min (ref >60)
Glucose, Bld: 109 mg/dL — ABNORMAL HIGH (ref 70–99)
Potassium: 3.9 mmol/L (ref 3.5–5.1)
Sodium: 136 mmol/L (ref 135–145)
Total Bilirubin: 1.2 mg/dL (ref 0.3–1.2)
Total Protein: 7.3 g/dL (ref 6.5–8.1)

## 2019-11-10 LAB — URINALYSIS, ROUTINE W REFLEX MICROSCOPIC
Bilirubin Urine: NEGATIVE
Glucose, UA: NEGATIVE mg/dL
Hgb urine dipstick: NEGATIVE
Ketones, ur: 20 mg/dL — AB
Leukocytes,Ua: NEGATIVE
Nitrite: POSITIVE — AB
Protein, ur: 30 mg/dL — AB
Specific Gravity, Urine: 1.025 (ref 1.005–1.030)
pH: 5 (ref 5.0–8.0)

## 2019-11-10 LAB — RESPIRATORY PANEL BY RT PCR (FLU A&B, COVID)
Influenza A by PCR: NEGATIVE
Influenza B by PCR: NEGATIVE
SARS Coronavirus 2 by RT PCR: NEGATIVE

## 2019-11-10 MED ORDER — PIPERACILLIN-TAZOBACTAM 3.375 G IVPB 30 MIN
3.3750 g | Freq: Once | INTRAVENOUS | Status: AC
  Administered 2019-11-10: 13:00:00 3.375 g via INTRAVENOUS
  Filled 2019-11-10: qty 50

## 2019-11-10 MED ORDER — IOHEXOL 300 MG/ML  SOLN
100.0000 mL | Freq: Once | INTRAMUSCULAR | Status: AC | PRN
  Administered 2019-11-10: 100 mL via INTRAVENOUS

## 2019-11-10 MED ORDER — SODIUM CHLORIDE 0.9 % IV BOLUS
1000.0000 mL | Freq: Once | INTRAVENOUS | Status: AC
  Administered 2019-11-10: 13:00:00 1000 mL via INTRAVENOUS

## 2019-11-10 MED ORDER — MORPHINE SULFATE (PF) 2 MG/ML IV SOLN
1.0000 mg | INTRAVENOUS | Status: DC | PRN
  Administered 2019-11-11: 2 mg via INTRAVENOUS
  Filled 2019-11-10: qty 1

## 2019-11-10 MED ORDER — SIMETHICONE 80 MG PO CHEW
40.0000 mg | CHEWABLE_TABLET | Freq: Four times a day (QID) | ORAL | Status: DC | PRN
  Administered 2019-11-10: 40 mg via ORAL
  Filled 2019-11-10: qty 1

## 2019-11-10 MED ORDER — OXYCODONE HCL 5 MG PO TABS
5.0000 mg | ORAL_TABLET | ORAL | Status: DC | PRN
  Administered 2019-11-10: 5 mg via ORAL
  Filled 2019-11-10: qty 1

## 2019-11-10 MED ORDER — ACETAMINOPHEN 650 MG RE SUPP: 650.0000 mg | Freq: Four times a day (QID) | RECTAL | Status: DC | PRN

## 2019-11-10 MED ORDER — HYDROCODONE-ACETAMINOPHEN 5-325 MG PO TABS
1.0000 | ORAL_TABLET | Freq: Once | ORAL | Status: AC
  Administered 2019-11-10: 1 via ORAL
  Filled 2019-11-10: qty 1

## 2019-11-10 MED ORDER — SODIUM CHLORIDE 0.9% FLUSH: 3.0000 mL | Freq: Once | INTRAVENOUS | Status: DC

## 2019-11-10 MED ORDER — SODIUM CHLORIDE 0.9 % IV BOLUS
1000.0000 mL | Freq: Once | INTRAVENOUS | Status: AC
  Administered 2019-11-10: 1000 mL via INTRAVENOUS

## 2019-11-10 MED ORDER — ONDANSETRON 4 MG PO TBDP: 4.0000 mg | ORAL_TABLET | Freq: Four times a day (QID) | ORAL | Status: DC | PRN

## 2019-11-10 MED ORDER — SODIUM CHLORIDE 0.9 % IV SOLN
1.0000 g | Freq: Once | INTRAVENOUS | Status: AC
  Administered 2019-11-10: 11:00:00 1 g via INTRAVENOUS
  Filled 2019-11-10: qty 10

## 2019-11-10 MED ORDER — ACETAMINOPHEN 325 MG PO TABS: 650.0000 mg | ORAL_TABLET | Freq: Four times a day (QID) | ORAL | Status: DC | PRN

## 2019-11-10 MED ORDER — PIPERACILLIN-TAZOBACTAM 3.375 G IVPB: 3.3750 g | Freq: Three times a day (TID) | INTRAVENOUS | Status: DC

## 2019-11-10 MED ORDER — DIPHENHYDRAMINE HCL 50 MG/ML IJ SOLN: 12.5000 mg | Freq: Four times a day (QID) | INTRAMUSCULAR | Status: DC | PRN

## 2019-11-10 MED ORDER — METRONIDAZOLE IN NACL 5-0.79 MG/ML-% IV SOLN
500.0000 mg | Freq: Once | INTRAVENOUS | Status: DC
  Filled 2019-11-10: qty 100

## 2019-11-10 MED ORDER — KCL IN DEXTROSE-NACL 20-5-0.45 MEQ/L-%-% IV SOLN
INTRAVENOUS | Status: DC
  Filled 2019-11-10 (×6): qty 1000

## 2019-11-10 MED ORDER — PIPERACILLIN-TAZOBACTAM 3.375 G IVPB
3.3750 g | Freq: Three times a day (TID) | INTRAVENOUS | Status: DC
  Administered 2019-11-10 – 2019-11-13 (×8): 3.375 g via INTRAVENOUS
  Filled 2019-11-10 (×8): qty 50

## 2019-11-10 MED ORDER — DIPHENHYDRAMINE HCL 12.5 MG/5ML PO ELIX: 12.5000 mg | ORAL_SOLUTION | Freq: Four times a day (QID) | ORAL | Status: DC | PRN

## 2019-11-10 MED ORDER — SODIUM CHLORIDE 0.9 % IV SOLN: 1.0000 g | Freq: Once | INTRAVENOUS | Status: DC

## 2019-11-10 MED ORDER — ENOXAPARIN SODIUM 40 MG/0.4ML ~~LOC~~ SOLN
40.0000 mg | SUBCUTANEOUS | Status: DC
  Filled 2019-11-10 (×2): qty 0.4

## 2019-11-10 MED ORDER — ONDANSETRON HCL 4 MG/2ML IJ SOLN: 4.0000 mg | Freq: Four times a day (QID) | INTRAMUSCULAR | Status: DC | PRN

## 2019-11-10 MED ORDER — PANTOPRAZOLE SODIUM 40 MG IV SOLR
40.0000 mg | Freq: Every day | INTRAVENOUS | Status: DC
  Administered 2019-11-10 – 2019-11-12 (×3): 40 mg via INTRAVENOUS
  Filled 2019-11-10 (×3): qty 40

## 2019-11-10 NOTE — ED Notes (Signed)
Urine sample sent down to lab WITH culture 

## 2019-11-10 NOTE — ED Provider Notes (Signed)
[facility] EMERGENCY DEPARTMENT Provider Note   CSN: [id number] Arrival date & time: 11/10/19  3664     History Chief Complaint  Patient presents with  . Abdominal Pain    Erin Benjamin is a 29 y.o. female.  The history is provided by medical records and the patient. No language interpreter was used.  Abdominal Pain  Erin Benjamin is a 29 y.o. female who presents to the Emergency Department complaining of abdominal pain. She presents the emergency department complaining of three days of left sided abdominal pain. Pain is located in her left upper quadrant radiates to her flank and left lower quadrant. She had associated nausea and vomiting two days ago, now resolved. She had a temperature to 100 with associated chills yesterday. She was seen in urgent care two days ago for similar symptoms and started on antibiotics for a urinary tract infection. She has been compliant with the medications but does not feel any better. She has associated constipation. Pain is described as sharp in nature. It is also worse with deep breaths. She did have cholecystectomy in December. She denies any leg swelling or pain. No prior similar symptoms. She has no medical problems. She does reports increased urination with urinary frequency but no dysuria. She also states that her urine is darker in color than normal. She is sexually active with her husband, no vaginal discharge. Her menstrual cycles are irregular.    Past Medical History:  Diagnosis Date  . Medical history non-contributory     Patient Active Problem List   Diagnosis Date Noted  . Diverticulitis of colon with perforation 11/10/2019  . Cholelithiasis with acute cholecystitis 07/30/2018  . Post-operative state 11/18/2017  . Premature rupture of membranes 11/17/2017  . Encounter for induction of labor 11/17/2017  . Bacterial vaginosis 06/15/2017  . Previous cesarean delivery affecting pregnancy, antepartum 05/29/2017  .  Former cigarette smoker 05/18/2017    Past Surgical History:  Procedure Laterality Date  . CESAREAN SECTION  08/2014  . CESAREAN SECTION N/A 11/18/2017   Procedure: CESAREAN SECTION;  Surgeon: Hermina Staggers, MD;  Location: [facility] BIRTHING SUITES;  Service: Obstetrics;  Laterality: N/A;  . CHOLECYSTECTOMY N/A 07/30/2018   Procedure: LAPAROSCOPIC CHOLECYSTECTOMY WITH INTRAOPERATIVE CHOLANGIOGRAM;  Surgeon: Harriette Bouillon, MD;  Location: [facility] OR;  Service: General;  Laterality: N/A;     OB History    Gravida  2   Para  2   Term  2   Preterm  0   AB  0   Living  2     SAB      TAB      Ectopic      Multiple  0   Live Births  2           Family History  Problem Relation Age of Onset  . Diabetes Mother     Social History   Tobacco Use  . Smoking status: Former Smoker    Packs/day: 1.00    Types: Cigarettes    Quit date: 02/20/2017    Years since quitting: 2.7  . Smokeless tobacco: Never Used  . Tobacco comment: stopped with pregnancy  Substance Use Topics  . Alcohol use: Yes    Comment:    . Drug use: Yes    Types: Marijuana    Comment: stopped 02/2017    Home Medications Prior to Admission medications   Medication Sig Start Date End Date Taking? Authorizing Provider  nitrofurantoin, macrocrystal-monohydrate, (MACROBID) 100 MG capsule Take  100 mg by mouth 2 (two) times daily. 07/08/19  Yes [provider]  phenazopyridine (PYRIDIUM) 100 MG tablet Take 100 mg by mouth 3 (three) times daily as needed for pain.   Yes [provider]  acetaminophen (TYLENOL) 500 MG tablet You can take 1000 mg of plain Tylenol(acetaminophen) every 8 hours as needed for pain.  You can buy this over-the-counter at any drugstore without a prescription.  Not exceed 4000 mg of Tylenol per day it can harm your liver. Patient not taking: Reported on 11/10/2019 07/31/18   Sherrie George, PA-C  ibuprofen (ADVIL,MOTRIN) 200 MG tablet You can take 2 to 3 tablets every 6  hours as needed for pain.  This should be your second drug, if plain Tylenol is ineffective.  You can buy this over-the-counter at any drugstore without a prescription. Patient not taking: Reported on 11/10/2019 07/31/18   Sherrie George, PA-C    Allergies    Patient has no known allergies.  Review of Systems   Review of Systems  Gastrointestinal: Positive for abdominal pain.  All other systems reviewed and are negative.   Physical Exam Updated Vital Signs BP 122/79 (BP Location: Right Arm)   Pulse 100   Temp 99.4 F (37.4 C) (Oral)   Resp (!) 22   Ht 5\' 8"  (1.727 m)   Wt 97.5 kg   LMP 09/30/2019   SpO2 98%   BMI 32.69 kg/m   Physical Exam Vitals and nursing note reviewed.  Constitutional:      Appearance: She is well-developed.  HENT:     Head: Normocephalic and atraumatic.  Cardiovascular:     Rate and Rhythm: Normal rate and regular rhythm.     Heart sounds: No murmur.  Pulmonary:     Effort: Pulmonary effort is normal. No respiratory distress.     Breath sounds: Normal breath sounds.  Abdominal:     Palpations: Abdomen is soft.     Tenderness: There is no guarding or rebound.     Comments: Moderate left sided abdominal tenderness, mild left cva tenderness  Musculoskeletal:        General: No tenderness.  Skin:    General: Skin is warm and dry.  Neurological:     Mental Status: She is alert and oriented to person, place, and time.  Psychiatric:        Behavior: Behavior normal.     ED Results / Procedures / Treatments   Labs (all labs ordered are listed, but only abnormal results are displayed) Labs Reviewed  COMPREHENSIVE METABOLIC PANEL - Abnormal; Notable for the following components:      Result Value   Glucose, Bld 109 (*)    All other components within normal limits  CBC - Abnormal; Notable for the following components:   WBC 17.3 (*)    All other components within normal limits  URINALYSIS, ROUTINE W REFLEX MICROSCOPIC - Abnormal; Notable  for the following components:   Color, Urine AMBER (*)    Ketones, ur 20 (*)    Protein, ur 30 (*)    Nitrite POSITIVE (*)    Bacteria, UA RARE (*)    All other components within normal limits  RESPIRATORY PANEL BY RT PCR (FLU A&B, COVID)  URINE CULTURE  LIPASE, BLOOD  I-STAT BETA HCG BLOOD, ED ([facility], [facility], [facility] ONLY)    EKG None  Radiology CT Abdomen Pelvis W Contrast  Result Date: 11/10/2019 CLINICAL DATA:  Left abdomen pain since Friday. EXAM: CT ABDOMEN AND PELVIS  WITH CONTRAST TECHNIQUE: Multidetector CT imaging of the abdomen and pelvis was performed using the standard protocol following bolus administration of intravenous contrast. CONTRAST:  OMNIPAQUE IOHEXOL 300 MG/ML  SOLN COMPARISON:  None. FINDINGS: Lower chest: Minimal left pleural effusion with atelectasis of left lung base are noted. The heart size is normal. Hepatobiliary: No focal liver abnormality is seen. Status post cholecystectomy. No biliary dilatation. Pancreas: Unremarkable. No pancreatic ductal dilatation or surrounding inflammatory changes. Spleen: Normal in size without focal abnormality. Adrenals/Urinary Tract: Bilateral adrenal glands are normal. Left kidney is normal. There is no left hydronephrosis. There is mild dilatation of the right collecting system without evidence of focal discrete obstructing stone. No focal right kidney lesion is identified. The bladder is normal. Stomach/Bowel: Bowel wall thickening with surrounding inflammation and stranding identified surrounding the colon at the splenic flexure. There are 2 tiny foci of free air identified at the site of inflammation and infection. There is no bowel obstruction. The appendix is normal. Small hiatal hernia is identified. The stomach is otherwise normal. Vascular/Lymphatic: No significant vascular findings are present. No enlarged abdominal or pelvic lymph nodes. Reproductive: Uterus and bilateral adnexa are unremarkable. Other: None. Musculoskeletal: No  acute abnormality. IMPRESSION: 1. Bowel wall thickening with surrounding inflammation and stranding identified surrounding the colon at the splenic flexure. There are 2 tiny foci of free air identified at the site of inflammation and infection. This is consistent with acute diverticulitis. Follow-up after treatment is recommended to ensure no underlying mass is present. 2. Mild dilatation of the right collecting system without evidence of focal discrete obstructing stone. These results were called by telephone at the time of interpretation on 11/10/2019 at 11:41 am to provider [facility] , who verbally acknowledged these results. Electronically Signed   By: Sherian Rein M.D.   On: 11/10/2019 11:47    Procedures Procedures (including critical care time)  Medications Ordered in ED Medications  sodium chloride flush (NS) 0.9 % injection 3 mL (3 mLs Intravenous Not Given 11/10/19 1042)  enoxaparin (LOVENOX) injection 40 mg (has no administration in time range)  dextrose 5 % and 0.45 % NaCl with KCl 20 mEq/L infusion (has no administration in time range)  piperacillin-tazobactam (ZOSYN) IVPB 3.375 g (has no administration in time range)  acetaminophen (TYLENOL) tablet 650 mg (has no administration in time range)    Or  acetaminophen (TYLENOL) suppository 650 mg (has no administration in time range)  oxyCODONE (Oxy IR/ROXICODONE) immediate release tablet 5-10 mg (has no administration in time range)  morphine 2 MG/ML injection 1-2 mg (has no administration in time range)  diphenhydrAMINE (BENADRYL) 12.5 MG/5ML elixir 12.5 mg (has no administration in time range)    Or  diphenhydrAMINE (BENADRYL) injection 12.5 mg (has no administration in time range)  ondansetron (ZOFRAN-ODT) disintegrating tablet 4 mg (has no administration in time range)    Or  ondansetron (ZOFRAN) injection 4 mg (has no administration in time range)  simethicone (MYLICON) chewable tablet 40 mg (has no administration in time  range)  pantoprazole (PROTONIX) injection 40 mg (has no administration in time range)  sodium chloride 0.9 % bolus 1,000 mL (0 mLs Intravenous Stopped 11/10/19 1212)  cefTRIAXone (ROCEPHIN) 1 g in sodium chloride 0.9 % 100 mL IVPB (0 g Intravenous Stopped 11/10/19 1211)  HYDROcodone-acetaminophen (NORCO/VICODIN) 5-325 MG per tablet 1 tablet (1 tablet Oral Given 11/10/19 1038)  iohexol (OMNIPAQUE) 300 MG/ML solution 100 mL (100 mLs Intravenous Contrast Given 11/10/19 1128)  sodium chloride 0.9 % bolus  1,000 mL (0 mLs Intravenous Stopped 11/10/19 1532)  piperacillin-tazobactam (ZOSYN) IVPB 3.375 g (0 g Intravenous Stopped 11/10/19 1532)    ED Course  I have reviewed the triage vital signs and the nursing notes.  Pertinent labs & imaging results that were available during my care of the patient were reviewed by me and considered in my medical decision making (see chart for details).    MDM Rules/Calculators/A&P                     Patient here for evaluation of left sided abdominal pain, fevers. She is non-toxic appearing on evaluation but does have focal left sided abdominal pain. Labs demonstrate leukocytosis, UA concerning for UTI and she was treated with Rocephin. CT abdomen pelvis was obtained, which demonstrates acute diverticulitis with micro perforation. Discussed with pharmacist regarding medication options, will expand her coverage to include Zosyn. Surgery consulted for further management. Patient updated findings of studies and recommendation for admission and she is in agreement with treatment plan.  Final Clinical Impression(s) / ED Diagnoses Final diagnoses:  Acute diverticulitis    Rx / DC Orders ED Discharge Orders    None       Quintella Reichert, MD 11/10/19 1542

## 2019-11-10 NOTE — ED Notes (Addendum)
Pt last drank @1038 . No food since before midnight. All of pts jewelry is removed.

## 2019-11-10 NOTE — ED Triage Notes (Signed)
C/o L sided abd pain since Friday.  States she was seen at an Urgent Care and started on antibiotic for ? UTI.  States she is continuing to have pain.

## 2019-11-10 NOTE — H&P (Signed)
CC: abdominal pain  Requesting provider: Dr Madilyn Hook  HPI: Erin Benjamin is an 28 y.o. female who is here for abdominal pain that started on Friday.  She states that she developed acute onset of left upper lateral abdominal pain and points to the side of her ribs.  She states that she had never had anything like that before except when she had gallbladder pain but that was on the other side.  She had some fevers and chills.  She went to urgent care and was told that she had a urinary tract infection.  The pain persisted and actually got worse so that prompted her to come to the emergency room this morning.  She had some nausea but no vomiting.  Her last bowel movement was on Wednesday.  She typically has a bowel movement every other day.  She has had a C-section and cholecystectomy.  She denies any melena hematochezia.  She denies any stool caliber changes.  She denies any family history of colorectal issues or cancers.  She does not take any prescription medication.  Denies any weight loss.  She has had a poor appetite since developing pain.  She states that it hurts to take a deep breath.  Her CT scan showed splenic flexure diverticulitis with microperforation  She states that she quit smoking about a month ago.  She does drink some alcohol on the weekends.  And smokes marijuana occasionally but no other drug use.  Past Medical History:  Diagnosis Date  . Medical history non-contributory     Past Surgical History:  Procedure Laterality Date  . CESAREAN SECTION  08/2014  . CESAREAN SECTION N/A 11/18/2017   Procedure: CESAREAN SECTION;  Surgeon: Hermina Staggers, MD;  Location: [facility];  Service: Obstetrics;  Laterality: N/A;  . CHOLECYSTECTOMY N/A 07/30/2018   Procedure: LAPAROSCOPIC CHOLECYSTECTOMY WITH INTRAOPERATIVE CHOLANGIOGRAM;  Surgeon: Harriette Bouillon, MD;  Location: [facility] OR;  Service: General;  Laterality: N/A;    Family History  Problem Relation Age of Onset  . Diabetes  Mother     Social:  reports that she quit smoking about 2 years ago. Her smoking use included cigarettes. She smoked 1.00 pack per day. She has never used smokeless tobacco. She reports current alcohol use. She reports current drug use. Drug: Marijuana.  Allergies: No Known Allergies  Medications: I have reviewed the patient's current medications.  Results for orders placed or performed during the hospital encounter of 11/10/19 (from the past 48 hour(s))  Lipase, blood     Status: None   Collection Time: 11/10/19  9:45 AM  Result Value Ref Range   Lipase 27 11 - 51 U/L    Comment: Performed at [facility], [address] [place], [place] [address]  Comprehensive metabolic panel     Status: Abnormal   Collection Time: 11/10/19  9:45 AM  Result Value Ref Range   Sodium 136 135 - 145 mmol/L   Potassium 3.9 3.5 - 5.1 mmol/L   Chloride 102 98 - 111 mmol/L   CO2 24 22 - 32 mmol/L   Glucose, Bld 109 (H) 70 - 99 mg/dL    Comment: Glucose reference range applies only to samples taken after fasting for at least 8 hours.   BUN 6 6 - 20 mg/dL   Creatinine, Ser 6.78 0.44 - 1.00 mg/dL   Calcium 9.1 8.9 - 93.8 mg/dL   Total Protein 7.3 6.5 - 8.1 g/dL   Albumin 3.6 3.5 - 5.0 g/dL  AST 29 15 - 41 U/L   ALT 21 0 - 44 U/L   Alkaline Phosphatase 67 38 - 126 U/L   Total Bilirubin 1.2 0.3 - 1.2 mg/dL   GFR calc non Af Amer >60 >60 mL/min   GFR calc Af Amer >60 >60 mL/min   Anion gap 10 5 - 15    Comment: Performed at [facility], [address], [place], [place] [address]  CBC     Status: Abnormal   Collection Time: 11/10/19  9:45 AM  Result Value Ref Range   WBC 17.3 (H) 4.0 - 10.5 K/uL   RBC 4.66 3.87 - 5.11 MIL/uL   Hemoglobin 14.3 12.0 - 15.0 g/dL   HCT 62.1 30.8 - 65.7 %   MCV 93.6 80.0 - 100.0 fL   MCH 30.7 26.0 - 34.0 pg   MCHC 32.8 30.0 - 36.0 g/dL   RDW 84.6 96.2 - 95.2 %   Platelets 284 150 - 400 K/uL   nRBC 0.0 0.0 - 0.2 %    Comment: Performed at [facility], [address] [place], [place] [address]  Urinalysis, Routine w reflex microscopic     Status: Abnormal   Collection Time: 11/10/19 10:00 AM  Result Value Ref Range   Color, Urine AMBER (A) YELLOW    Comment: BIOCHEMICALS MAY BE AFFECTED BY COLOR   APPearance CLEAR CLEAR   Specific Gravity, Urine 1.025 1.005 - 1.030   pH 5.0 5.0 - 8.0   Glucose, UA NEGATIVE NEGATIVE mg/dL   Hgb urine dipstick NEGATIVE NEGATIVE   Bilirubin Urine NEGATIVE NEGATIVE   Ketones, ur 20 (A) NEGATIVE mg/dL   Protein, ur 30 (A) NEGATIVE mg/dL   Nitrite POSITIVE (A) NEGATIVE   Leukocytes,Ua NEGATIVE NEGATIVE   RBC / HPF 11-20 0 - 5 RBC/hpf   WBC, UA 6-10 0 - 5 WBC/hpf   Bacteria, UA RARE (A) NONE SEEN   Squamous Epithelial / LPF 0-5 0 - 5   Mucus PRESENT     Comment: Performed at [facility], [address] [place], [place] [address]  I-Stat beta hCG blood, ED     Status: None   Collection Time: 11/10/19 10:03 AM  Result Value Ref Range   I-stat hCG, quantitative <5.0 <5 mIU/mL   Comment 3            Comment:   GEST. AGE      CONC.  (mIU/mL)   <=1 WEEK        5 - 50     2 WEEKS       50 - 500     3 WEEKS       100 - 10,000     4 WEEKS     1,000 - 30,000        FEMALE AND NON-PREGNANT FEMALE:     LESS THAN 5 mIU/mL     CT Abdomen Pelvis W Contrast  Result Date: 11/10/2019 CLINICAL DATA:  Left abdomen pain since Friday. EXAM: CT ABDOMEN AND PELVIS WITH CONTRAST TECHNIQUE: Multidetector CT imaging of the abdomen and pelvis was performed using the standard protocol following bolus administration of intravenous contrast. CONTRAST:  OMNIPAQUE IOHEXOL 300 MG/ML  SOLN COMPARISON:  None. FINDINGS: Lower chest: Minimal left pleural effusion with atelectasis of left lung base are noted. The heart size is normal. Hepatobiliary: No focal liver abnormality is seen. Status post cholecystectomy. No biliary dilatation. Pancreas: Unremarkable. No pancreatic ductal dilatation  or surrounding  inflammatory changes. Spleen: Normal in size without focal abnormality. Adrenals/Urinary Tract: Bilateral adrenal glands are normal. Left kidney is normal. There is no left hydronephrosis. There is mild dilatation of the right collecting system without evidence of focal discrete obstructing stone. No focal right kidney lesion is identified. The bladder is normal. Stomach/Bowel: Bowel wall thickening with surrounding inflammation and stranding identified surrounding the colon at the splenic flexure. There are 2 tiny foci of free air identified at the site of inflammation and infection. There is no bowel obstruction. The appendix is normal. Small hiatal hernia is identified. The stomach is otherwise normal. Vascular/Lymphatic: No significant vascular findings are present. No enlarged abdominal or pelvic lymph nodes. Reproductive: Uterus and bilateral adnexa are unremarkable. Other: None. Musculoskeletal: No acute abnormality. IMPRESSION: 1. Bowel wall thickening with surrounding inflammation and stranding identified surrounding the colon at the splenic flexure. There are 2 tiny foci of free air identified at the site of inflammation and infection. This is consistent with acute diverticulitis. Follow-up after treatment is recommended to ensure no underlying mass is present. 2. Mild dilatation of the right collecting system without evidence of focal discrete obstructing stone. These results were called by telephone at the time of interpretation on 11/10/2019 at 11:41 am to provider [facility] , who verbally acknowledged these results. Electronically Signed   By: Sherian ReinWei-Chen  Lin M.D.   On: 11/10/2019 11:47    ROS - all of the below systems have been reviewed with the patient and positives are indicated with bold text General: chills, fever or night sweats Eyes: blurry vision or double vision ENT: epistaxis or sore throat Allergy/Immunology: itchy/watery eyes or nasal congestion Hematologic/Lymphatic: bleeding  problems, blood clots or swollen lymph nodes Endocrine: temperature intolerance or unexpected weight changes Breast: new or changing breast lumps or nipple discharge Resp: cough, shortness of breath, or wheezing CV: chest pain or dyspnea on exertion GI: as per HPI GU: dysuria, trouble voiding, or hematuria MSK: joint pain or joint stiffness Neuro: TIA or stroke symptoms Derm: pruritus and skin lesion changes Psych: anxiety and depression  PE Blood pressure 122/79, pulse 100, temperature 99.4 F (37.4 C), temperature source Oral, resp. rate (!) 22, height 5\' 8"  (1.727 m), weight 97.5 kg, last menstrual period 09/30/2019, SpO2 98 %, unknown if currently breastfeeding. Constitutional: NAD; conversant; no deformities Eyes: Moist conjunctiva; no lid lag; anicteric; PERRL Neck: Trachea midline; no thyromegaly Lungs: Normal respiratory effort; no tactile fremitus CV: RRR; no palpable thrills; no pitting edema GI: Abd soft, no distension, TTP Left lateral upper abd, no rebound/peritonitis; no palpable hepatosplenomegaly MSK: Normal gait; no clubbing/cyanosis Psychiatric: Appropriate affect; alert and oriented x3 Lymphatic: No palpable cervical or axillary lymphadenopathy Skin: no rash, induration, or lesions  Results for orders placed or performed during the hospital encounter of 11/10/19 (from the past 48 hour(s))  Lipase, blood     Status: None   Collection Time: 11/10/19  9:45 AM  Result Value Ref Range   Lipase 27 11 - 51 U/L    Comment: Performed at [facility], [address] [place], [place] [address]  Comprehensive metabolic panel     Status: Abnormal   Collection Time: 11/10/19  9:45 AM  Result Value Ref Range   Sodium 136 135 - 145 mmol/L   Potassium 3.9 3.5 - 5.1 mmol/L   Chloride 102 98 - 111 mmol/L   CO2 24 22 - 32 mmol/L   Glucose, Bld 109 (H) 70 - 99 mg/dL  Comment: Glucose reference range applies only to samples taken after fasting for at least 8 hours.    BUN 6 6 - 20 mg/dL   Creatinine, Ser 4.76 0.44 - 1.00 mg/dL   Calcium 9.1 8.9 - 54.6 mg/dL   Total Protein 7.3 6.5 - 8.1 g/dL   Albumin 3.6 3.5 - 5.0 g/dL   AST 29 15 - 41 U/L   ALT 21 0 - 44 U/L   Alkaline Phosphatase 67 38 - 126 U/L   Total Bilirubin 1.2 0.3 - 1.2 mg/dL   GFR calc non Af Amer >60 >60 mL/min   GFR calc Af Amer >60 >60 mL/min   Anion gap 10 5 - 15    Comment: Performed at [facility], [address] [place], [place] [address]  CBC     Status: Abnormal   Collection Time: 11/10/19  9:45 AM  Result Value Ref Range   WBC 17.3 (H) 4.0 - 10.5 K/uL   RBC 4.66 3.87 - 5.11 MIL/uL   Hemoglobin 14.3 12.0 - 15.0 g/dL   HCT 65.6 81.2 - 75.1 %   MCV 93.6 80.0 - 100.0 fL   MCH 30.7 26.0 - 34.0 pg   MCHC 32.8 30.0 - 36.0 g/dL   RDW 70.0 17.4 - 94.4 %   Platelets 284 150 - 400 K/uL   nRBC 0.0 0.0 - 0.2 %    Comment: Performed at [facility], [address], [place], [place] [address]  Urinalysis, Routine w reflex microscopic     Status: Abnormal   Collection Time: 11/10/19 10:00 AM  Result Value Ref Range   Color, Urine AMBER (A) YELLOW    Comment: BIOCHEMICALS MAY BE AFFECTED BY COLOR   APPearance CLEAR CLEAR   Specific Gravity, Urine 1.025 1.005 - 1.030   pH 5.0 5.0 - 8.0   Glucose, UA NEGATIVE NEGATIVE mg/dL   Hgb urine dipstick NEGATIVE NEGATIVE   Bilirubin Urine NEGATIVE NEGATIVE   Ketones, ur 20 (A) NEGATIVE mg/dL   Protein, ur 30 (A) NEGATIVE mg/dL   Nitrite POSITIVE (A) NEGATIVE   Leukocytes,Ua NEGATIVE NEGATIVE   RBC / HPF 11-20 0 - 5 RBC/hpf   WBC, UA 6-10 0 - 5 WBC/hpf   Bacteria, UA RARE (A) NONE SEEN   Squamous Epithelial / LPF 0-5 0 - 5   Mucus PRESENT     Comment: Performed at [facility], [address], [place], [place] [address]  I-Stat beta hCG blood, ED     Status: None   Collection Time: 11/10/19 10:03 AM  Result Value Ref Range   I-stat hCG, quantitative <5.0 <5 mIU/mL   Comment 3            Comment:   GEST. AGE       CONC.  (mIU/mL)   <=1 WEEK        5 - 50     2 WEEKS       50 - 500     3 WEEKS       100 - 10,000     4 WEEKS     1,000 - 30,000        FEMALE AND NON-PREGNANT FEMALE:     LESS THAN 5 mIU/mL     CT Abdomen Pelvis W Contrast  Result Date: 11/10/2019 CLINICAL DATA:  Left abdomen pain since Friday. EXAM: CT ABDOMEN AND PELVIS WITH CONTRAST TECHNIQUE: Multidetector CT imaging of the abdomen and pelvis was performed  using the standard protocol following bolus administration of intravenous contrast. CONTRAST:  177mL OMNIPAQUE IOHEXOL 300 MG/ML  SOLN COMPARISON:  None. FINDINGS: Lower chest: Minimal left pleural effusion with atelectasis of left lung base are noted. The heart size is normal. Hepatobiliary: No focal liver abnormality is seen. Status post cholecystectomy. No biliary dilatation. Pancreas: Unremarkable. No pancreatic ductal dilatation or surrounding inflammatory changes. Spleen: Normal in size without focal abnormality. Adrenals/Urinary Tract: Bilateral adrenal glands are normal. Left kidney is normal. There is no left hydronephrosis. There is mild dilatation of the right collecting system without evidence of focal discrete obstructing stone. No focal right kidney lesion is identified. The bladder is normal. Stomach/Bowel: Bowel wall thickening with surrounding inflammation and stranding identified surrounding the colon at the splenic flexure. There are 2 tiny foci of free air identified at the site of inflammation and infection. There is no bowel obstruction. The appendix is normal. Small hiatal hernia is identified. The stomach is otherwise normal. Vascular/Lymphatic: No significant vascular findings are present. No enlarged abdominal or pelvic lymph nodes. Reproductive: Uterus and bilateral adnexa are unremarkable. Other: None. Musculoskeletal: No acute abnormality. IMPRESSION: 1. Bowel wall thickening with surrounding inflammation and stranding identified surrounding the colon at the splenic  flexure. There are 2 tiny foci of free air identified at the site of inflammation and infection. This is consistent with acute diverticulitis. Follow-up after treatment is recommended to ensure no underlying mass is present. 2. Mild dilatation of the right collecting system without evidence of focal discrete obstructing stone. These results were called by telephone at the time of interpretation on 11/10/2019 at 11:41 am to provider [facility] , who verbally acknowledged these results. Electronically Signed   By: Abelardo Diesel M.D.   On: 11/10/2019 11:47    Imaging: Personally reviewed  A/P: Kimila Papaleo is an 29 y.o. female with  Splenic flexure diverticulitis with microperforation  She does not have peritonitis.  There is no gross free air just a few dots of extraluminal air.  She is nontoxic-appearing.  I think we can start with nonoperative management with bowel rest and IV antibiotics  I discussed diverticulitis and diverticular disease with the patient.  We discussed the typical hospitalization.  We discussed indications for surgery.  We also discussed the typical outpatient work-up which would be a colonoscopy in 6 to 8 weeks to make sure no underlying pathology other than diverticular disease  Admit inpatient IV abx Chemical vte prophylaxis Bowel rest, can have ice chips  Erin Benjamin. Redmond Pulling, MD, FACS General, Bariatric, & Minimally Invasive Surgery [facility]  Use [email or web address] to contact on call provider

## 2019-11-10 NOTE — Progress Notes (Signed)
RT instructed patient on the use of the incentive spirometer.  Patient was able to demonstrate back proper technique.  Patient needed to be coached to achieve 375 mL before the pain stopped her from deep breathing.  RT explained the importance to keep using the device and work on increasing the mL achieved.  Patient stated her understanding.

## 2019-11-11 LAB — BASIC METABOLIC PANEL
Anion gap: 8 (ref 5–15)
BUN: <5 mg/dL — ABNORMAL LOW (ref 6–20)
CO2: 26 mmol/L (ref 22–32)
Calcium: 8.2 mg/dL — ABNORMAL LOW (ref 8.9–10.3)
Chloride: 105 mmol/L (ref 98–111)
Creatinine, Ser: 0.64 mg/dL (ref 0.44–1.00)
GFR calc Af Amer: >60 mL/min (ref >60)
GFR calc non Af Amer: >60 mL/min (ref >60)
Glucose, Bld: 91 mg/dL (ref 70–99)
Potassium: 3.2 mmol/L — ABNORMAL LOW (ref 3.5–5.1)
Sodium: 139 mmol/L (ref 135–145)

## 2019-11-11 LAB — CBC
HCT: 36.9 % (ref 36.0–46.0)
Hemoglobin: 12 g/dL (ref 12.0–15.0)
MCH: 30.3 pg (ref 26.0–34.0)
MCHC: 32.5 g/dL (ref 30.0–36.0)
MCV: 93.2 fL (ref 80.0–100.0)
Platelets: 237 10*3/uL (ref 150–400)
RBC: 3.96 MIL/uL (ref 3.87–5.11)
RDW: 12 % (ref 11.5–15.5)
WBC: 12.4 10*3/uL — ABNORMAL HIGH (ref 4.0–10.5)
nRBC: 0 % (ref 0.0–0.2)

## 2019-11-11 LAB — URINE CULTURE: Culture: NO GROWTH

## 2019-11-11 LAB — MAGNESIUM: Magnesium: 1.7 mg/dL (ref 1.7–2.4)

## 2019-11-11 LAB — PHOSPHORUS: Phosphorus: 3 mg/dL (ref 2.5–4.6)

## 2019-11-11 MED ORDER — POTASSIUM CHLORIDE CRYS ER 20 MEQ PO TBCR
20.0000 meq | EXTENDED_RELEASE_TABLET | Freq: Two times a day (BID) | ORAL | Status: AC
  Administered 2019-11-11 (×2): 20 meq via ORAL
  Filled 2019-11-11 (×2): qty 1

## 2019-11-11 MED ORDER — POLYETHYLENE GLYCOL 3350 17 G PO PACK
17.0000 g | PACK | Freq: Every day | ORAL | Status: DC | PRN
  Administered 2019-11-11: 10:00:00 17 g via ORAL
  Filled 2019-11-11: qty 1

## 2019-11-11 MED ORDER — DOCUSATE SODIUM 100 MG PO CAPS
100.0000 mg | ORAL_CAPSULE | Freq: Two times a day (BID) | ORAL | Status: DC
  Administered 2019-11-11 – 2019-11-12 (×3): 100 mg via ORAL
  Filled 2019-11-11 (×5): qty 1

## 2019-11-11 NOTE — Plan of Care (Signed)

## 2019-11-11 NOTE — Progress Notes (Addendum)
[facility] [place] [facility] Progress Note     Subjective: CC-  Overall feeling better than prior to admission, but continues to have some LUQ abdominal pain. Worse with deep inspiration. Denies n/v. Feels hungry. Passing flatus, no BM.   Objective: Vital signs in last 24 hours: Temp:  [98.4 F (36.9 C)-99.4 F (37.4 C)] 98.4 F (36.9 C) (03/29 0618) Pulse Rate:  [70-100] 73 (03/29 0618) Resp:  [16-22] 17 (03/29 0618) BP: (101-126)/(68-91) 101/68 (03/29 0618) SpO2:  [98 %-100 %] 98 % (03/29 0618) Weight:  [97.5 kg] 97.5 kg (03/28 0935) Last BM Date: 11/06/19  Intake/Output from previous day: 03/28 0701 - 03/29 0700 In: 1312.6 [I.V.:1212.6; IV Piggyback:100] Out: 1500 [Urine:1500] Intake/Output this shift: No intake/output data recorded.  PE: Gen:  Alert, NAD, pleasant HEENT: EOM's intact, pupils equal and round Card:  RRR Pulm:  CTAB, no W/R/R, rate and effort normal Abd: Soft, ND, mild LUQ TTP without rebound or guarding, +BS, no HSM Skin: no rashes noted, warm and dry  Lab Results:  Recent Labs    11/10/19 0945 11/11/19 0226  WBC 17.3* 12.4*  HGB 14.3 12.0  HCT 43.6 36.9  PLT 284 237   BMET Recent Labs    11/10/19 0945 11/11/19 0226  NA 136 139  K 3.9 3.2*  CL 102 105  CO2 24 26  GLUCOSE 109* 91  BUN 6 <5*  CREATININE 0.69 0.64  CALCIUM 9.1 8.2*   PT/INR No results for input(s): LABPROT, INR in the last 72 hours. CMP     Component Value Date/Time   NA 139 11/11/2019 0226   NA 136 08/21/2017 1656   K 3.2 (L) 11/11/2019 0226   CL 105 11/11/2019 0226   CO2 26 11/11/2019 0226   GLUCOSE 91 11/11/2019 0226   BUN <5 (L) 11/11/2019 0226   BUN 8 08/21/2017 1656   CREATININE 0.64 11/11/2019 0226   CALCIUM 8.2 (L) 11/11/2019 0226   PROT 7.3 11/10/2019 0945   PROT 6.6 08/21/2017 1656   ALBUMIN 3.6 11/10/2019 0945   ALBUMIN 3.5 08/21/2017 1656   AST 29 11/10/2019 0945   ALT 21 11/10/2019 0945   ALKPHOS 67 11/10/2019 0945   BILITOT 1.2 11/10/2019  0945   BILITOT <0.2 08/21/2017 1656   GFRNONAA >60 11/11/2019 0226   GFRAA >60 11/11/2019 0226   Lipase     Component Value Date/Time   LIPASE 27 11/10/2019 0945       Studies/Results: CT Abdomen Pelvis W Contrast  Result Date: 11/10/2019 CLINICAL DATA:  Left abdomen pain since Friday. EXAM: CT ABDOMEN AND PELVIS WITH CONTRAST TECHNIQUE: Multidetector CT imaging of the abdomen and pelvis was performed using the standard protocol following bolus administration of intravenous contrast. CONTRAST:  162mL OMNIPAQUE IOHEXOL 300 MG/ML  SOLN COMPARISON:  None. FINDINGS: Lower chest: Minimal left pleural effusion with atelectasis of left lung base are noted. The heart size is normal. Hepatobiliary: No focal liver abnormality is seen. Status post cholecystectomy. No biliary dilatation. Pancreas: Unremarkable. No pancreatic ductal dilatation or surrounding inflammatory changes. Spleen: Normal in size without focal abnormality. Adrenals/Urinary Tract: Bilateral adrenal glands are normal. Left kidney is normal. There is no left hydronephrosis. There is mild dilatation of the right collecting system without evidence of focal discrete obstructing stone. No focal right kidney lesion is identified. The bladder is normal. Stomach/Bowel: Bowel wall thickening with surrounding inflammation and stranding identified surrounding the colon at the splenic flexure. There are 2 tiny foci of free air identified at the site  of inflammation and infection. There is no bowel obstruction. The appendix is normal. Small hiatal hernia is identified. The stomach is otherwise normal. Vascular/Lymphatic: No significant vascular findings are present. No enlarged abdominal or pelvic lymph nodes. Reproductive: Uterus and bilateral adnexa are unremarkable. Other: None. Musculoskeletal: No acute abnormality. IMPRESSION: 1. Bowel wall thickening with surrounding inflammation and stranding identified surrounding the colon at the splenic  flexure. There are 2 tiny foci of free air identified at the site of inflammation and infection. This is consistent with acute diverticulitis. Follow-up after treatment is recommended to ensure no underlying mass is present. 2. Mild dilatation of the right collecting system without evidence of focal discrete obstructing stone. These results were called by telephone at the time of interpretation on 11/10/2019 at 11:41 am to provider [facility] , who verbally acknowledged these results. Electronically Signed   By: Sherian Rein M.D.   On: 11/10/2019 11:47    Anti-infectives: Anti-infectives (From admission, onward)   Start     Dose/Rate Route Frequency Ordered Stop   11/10/19 2000  piperacillin-tazobactam (ZOSYN) IVPB 3.375 g     3.375 g 12.5 mL/hr over 240 Minutes Intravenous Every 8 hours 11/10/19 1619     11/10/19 1545  piperacillin-tazobactam (ZOSYN) IVPB 3.375 g  Status:  Discontinued     3.375 g 12.5 mL/hr over 240 Minutes Intravenous Every 8 hours 11/10/19 1532 11/10/19 1619   11/10/19 1230  piperacillin-tazobactam (ZOSYN) IVPB 3.375 g     3.375 g 100 mL/hr over 30 Minutes Intravenous  Once 11/10/19 1219 11/10/19 1532   11/10/19 1215  cefTRIAXone (ROCEPHIN) 1 g in sodium chloride 0.9 % 100 mL IVPB  Status:  Discontinued     1 g 200 mL/hr over 30 Minutes Intravenous  Once 11/10/19 1209 11/10/19 1218   11/10/19 1200  metroNIDAZOLE (FLAGYL) IVPB 500 mg  Status:  Discontinued     500 mg 100 mL/hr over 60 Minutes Intravenous  Once 11/10/19 1152 11/10/19 1218   11/10/19 1030  cefTRIAXone (ROCEPHIN) 1 g in sodium chloride 0.9 % 100 mL IVPB     1 g 200 mL/hr over 30 Minutes Intravenous  Once 11/10/19 1025 11/10/19 1211       Assessment/Plan   Splenic flexure diverticulitis with microperforation - 1st bout, never had a colonoscopy - CT scan shows bowel wall thickening with surrounding inflammation and stranding identified surrounding the colon at the splenic flexure with 2 tiny foci of  free air   ID - zosyn 3/28>> FEN - IVF, CLD. Replace K VTE - SCDs, lovenox Foley - none Follow up - TBD  Plan: WBC trending down and minimally tender on exam. Ok for clear liquids. Continue IV zosyn. Repeat labs in AM.   LOS: 1 day    Franne Forts, [facility] 11/11/2019, 8:40 AM Please see Amion for pager number during day hours 7:00am-4:30pm

## 2019-11-12 LAB — BASIC METABOLIC PANEL
Anion gap: 10 (ref 5–15)
BUN: <5 mg/dL — ABNORMAL LOW (ref 6–20)
CO2: 25 mmol/L (ref 22–32)
Calcium: 9 mg/dL (ref 8.9–10.3)
Chloride: 104 mmol/L (ref 98–111)
Creatinine, Ser: 0.74 mg/dL (ref 0.44–1.00)
GFR calc Af Amer: >60 mL/min (ref >60)
GFR calc non Af Amer: >60 mL/min (ref >60)
Glucose, Bld: 94 mg/dL (ref 70–99)
Potassium: 4.2 mmol/L (ref 3.5–5.1)
Sodium: 139 mmol/L (ref 135–145)

## 2019-11-12 LAB — CBC
HCT: 38.8 % (ref 36.0–46.0)
Hemoglobin: 12.4 g/dL (ref 12.0–15.0)
MCH: 30 pg (ref 26.0–34.0)
MCHC: 32 g/dL (ref 30.0–36.0)
MCV: 93.7 fL (ref 80.0–100.0)
Platelets: 272 10*3/uL (ref 150–400)
RBC: 4.14 MIL/uL (ref 3.87–5.11)
RDW: 11.9 % (ref 11.5–15.5)
WBC: 8.8 10*3/uL (ref 4.0–10.5)
nRBC: 0 % (ref 0.0–0.2)

## 2019-11-12 LAB — MAGNESIUM: Magnesium: 1.9 mg/dL (ref 1.7–2.4)

## 2019-11-12 MED ORDER — SACCHAROMYCES BOULARDII 250 MG PO CAPS
250.0000 mg | ORAL_CAPSULE | Freq: Two times a day (BID) | ORAL | Status: DC
  Administered 2019-11-12 (×2): 250 mg via ORAL
  Filled 2019-11-12 (×3): qty 1

## 2019-11-12 NOTE — TOC Initial Note (Signed)
Transition of Care [facility]) - Initial/Assessment Note    Patient Details  Name: Erin Benjamin MRN: [id number] Date of Birth: [birth year]  [facility]) CM/SW Contact:    Kingsley Plan, RN Phone Number: 11/12/2019, 4:48 PM  Clinical Narrative:                 Patient from home with minor children. Patient states she has insurance through her job , but does not have an insurance with her and not sure of name of insurance company. She will call her job first thing in morning to find out information.   Patient does not have a PCP , scheduled follow up appointment at [facility]  Expected Discharge Plan: Home/Self Care Barriers to Discharge: Continued Medical Work up   Patient Goals and CMS Choice Patient states their goals for this hospitalization and ongoing recovery are:: to return to home CMS Medicare.gov Compare Post Acute Care list provided to:: Patient Choice offered to / list presented to : Patient  Expected Discharge Plan and Services Expected Discharge Plan: Home/Self Care In-house Referral: Financial Counselor Discharge Planning Services: CM Consult                     DME Arranged: N/A         HH Arranged: NA          Prior Living Arrangements/Services   Lives with:: Self(minor children) Patient language and need for interpreter reviewed:: Yes Do you feel safe going back to the place where you live?: Yes      Need for Family Participation in Patient Care: Yes (Comment) Care giver support system in place?: Yes (comment)   Criminal Activity/Legal Involvement Pertinent to Current Situation/Hospitalization: No - Comment as needed  Activities of Daily Living Home Assistive Devices/Equipment: None ADL Screening (condition at time of admission) Patient's cognitive ability adequate to safely complete daily activities?: Yes Is the patient deaf or have difficulty hearing?: No Does the patient have difficulty seeing, even when  wearing glasses/contacts?: No Does the patient have difficulty concentrating, remembering, or making decisions?: No Patient able to express need for assistance with ADLs?: Yes Does the patient have difficulty dressing or bathing?: No Independently performs ADLs?: Yes (appropriate for developmental age) Does the patient have difficulty walking or climbing stairs?: No Weakness of Legs: None Weakness of Arms/Hands: None  Permission Sought/Granted   Permission granted to share information with : No              Emotional Assessment Appearance:: Appears stated age Attitude/Demeanor/Rapport: Engaged Affect (typically observed): Accepting Orientation: : Oriented to  Time, Oriented to Situation, Oriented to Place, Oriented to Self Alcohol / Substance Use: Not Applicable Psych Involvement: No (comment)  Admission diagnosis:  Diverticulitis of colon with perforation [K57.20] Acute diverticulitis [K57.92] Patient Active Problem List   Diagnosis Date Noted  . Diverticulitis of colon with perforation 11/10/2019  . Cholelithiasis with acute cholecystitis 07/30/2018  . Post-operative state 11/18/2017  . Premature rupture of membranes 11/17/2017  . Encounter for induction of labor 11/17/2017  . Bacterial vaginosis 06/15/2017  . Previous cesarean delivery affecting pregnancy, antepartum 05/29/2017  . Former cigarette smoker 05/18/2017   PCP:  Patient, No Pcp Per Pharmacy:   [facility] [phone] - [place], [place] - [phone] AT [facility] [address] [place] [address] Phone: [phone] Fax: [phone]  [name] [place], [place] - [address] [place] [address] Phone: [phone] Fax: [phone]     Social Determinants of Health (SDOH) Interventions    Readmission Risk Interventions No flowsheet data found.

## 2019-11-12 NOTE — Progress Notes (Signed)
[facility] Progress Note     Subjective: CC-  Feeling much better today. Denies any current abdominal pain. Tolerating clears without n/v. WBC down 8.8, afebrile.  Objective: Vital signs in last 24 hours: Temp:  [97.9 F (36.6 C)-98.5 F (36.9 C)] 97.9 F (36.6 C) (03/30 0401) Pulse Rate:  [56-77] 56 (03/30 0401) Resp:  [14-18] 14 (03/30 0401) BP: (107-124)/(65-88) 107/65 (03/30 0401) SpO2:  [98 %-100 %] 99 % (03/30 0401) Last BM Date: 11/11/19  Intake/Output from previous day: 03/29 0701 - 03/30 0700 In: -  Out: 800 [Urine:800] Intake/Output this shift: No intake/output data recorded.  PE: Gen:  Alert, NAD, pleasant HEENT: EOM's intact, pupils equal and round Card:  RRR Pulm:  CTAB, no W/R/R, rate and effort normal Abd: Soft, ND, nontender, +BS, no HSM Skin: no rashes noted, warm and dry  Lab Results:  Recent Labs    11/11/19 0226 11/12/19 0334  WBC 12.4* 8.8  HGB 12.0 12.4  HCT 36.9 38.8  PLT 237 272   BMET Recent Labs    11/11/19 0226 11/12/19 0334  NA 139 139  K 3.2* 4.2  CL 105 104  CO2 26 25  GLUCOSE 91 94  BUN <5* <5*  CREATININE 0.64 0.74  CALCIUM 8.2* 9.0   PT/INR No results for input(s): LABPROT, INR in the last 72 hours. CMP     Component Value Date/Time   NA 139 11/12/2019 0334   NA 136 08/21/2017 1656   K 4.2 11/12/2019 0334   CL 104 11/12/2019 0334   CO2 25 11/12/2019 0334   GLUCOSE 94 11/12/2019 0334   BUN <5 (L) 11/12/2019 0334   BUN 8 08/21/2017 1656   CREATININE 0.74 11/12/2019 0334   CALCIUM 9.0 11/12/2019 0334   PROT 7.3 11/10/2019 0945   PROT 6.6 08/21/2017 1656   ALBUMIN 3.6 11/10/2019 0945   ALBUMIN 3.5 08/21/2017 1656   AST 29 11/10/2019 0945   ALT 21 11/10/2019 0945   ALKPHOS 67 11/10/2019 0945   BILITOT 1.2 11/10/2019 0945   BILITOT <0.2 08/21/2017 1656   GFRNONAA >60 11/12/2019 0334   GFRAA >60 11/12/2019 0334   Lipase     Component Value Date/Time   LIPASE 27 11/10/2019 0945        Studies/Results: CT Abdomen Pelvis W Contrast  Result Date: 11/10/2019 CLINICAL DATA:  Left abdomen pain since Friday. EXAM: CT ABDOMEN AND PELVIS WITH CONTRAST TECHNIQUE: Multidetector CT imaging of the abdomen and pelvis was performed using the standard protocol following bolus administration of intravenous contrast. CONTRAST:  OMNIPAQUE IOHEXOL 300 MG/ML  SOLN COMPARISON:  None. FINDINGS: Lower chest: Minimal left pleural effusion with atelectasis of left lung base are noted. The heart size is normal. Hepatobiliary: No focal liver abnormality is seen. Status post cholecystectomy. No biliary dilatation. Pancreas: Unremarkable. No pancreatic ductal dilatation or surrounding inflammatory changes. Spleen: Normal in size without focal abnormality. Adrenals/Urinary Tract: Bilateral adrenal glands are normal. Left kidney is normal. There is no left hydronephrosis. There is mild dilatation of the right collecting system without evidence of focal discrete obstructing stone. No focal right kidney lesion is identified. The bladder is normal. Stomach/Bowel: Bowel wall thickening with surrounding inflammation and stranding identified surrounding the colon at the splenic flexure. There are 2 tiny foci of free air identified at the site of inflammation and infection. There is no bowel obstruction. The appendix is normal. Small hiatal hernia is identified. The stomach is otherwise normal. Vascular/Lymphatic: No significant vascular findings are  present. No enlarged abdominal or pelvic lymph nodes. Reproductive: Uterus and bilateral adnexa are unremarkable. Other: None. Musculoskeletal: No acute abnormality. IMPRESSION: 1. Bowel wall thickening with surrounding inflammation and stranding identified surrounding the colon at the splenic flexure. There are 2 tiny foci of free air identified at the site of inflammation and infection. This is consistent with acute diverticulitis. Follow-up after treatment is  recommended to ensure no underlying mass is present. 2. Mild dilatation of the right collecting system without evidence of focal discrete obstructing stone. These results were called by telephone at the time of interpretation on 11/10/2019 at 11:41 am to provider [facility] , who verbally acknowledged these results. Electronically Signed   By: Abelardo Diesel M.D.   On: 11/10/2019 11:47    Anti-infectives: Anti-infectives (From admission, onward)   Start     Dose/Rate Route Frequency Ordered Stop   11/10/19 2000  piperacillin-tazobactam (ZOSYN) IVPB 3.375 g     3.375 g 12.5 mL/hr over 240 Minutes Intravenous Every 8 hours 11/10/19 1619     11/10/19 1545  piperacillin-tazobactam (ZOSYN) IVPB 3.375 g  Status:  Discontinued     3.375 g 12.5 mL/hr over 240 Minutes Intravenous Every 8 hours 11/10/19 1532 11/10/19 1619   11/10/19 1230  piperacillin-tazobactam (ZOSYN) IVPB 3.375 g     3.375 g 100 mL/hr over 30 Minutes Intravenous  Once 11/10/19 1219 11/10/19 1532   11/10/19 1215  cefTRIAXone (ROCEPHIN) 1 g in sodium chloride 0.9 % 100 mL IVPB  Status:  Discontinued     1 g 200 mL/hr over 30 Minutes Intravenous  Once 11/10/19 1209 11/10/19 1218   11/10/19 1200  metroNIDAZOLE (FLAGYL) IVPB 500 mg  Status:  Discontinued     500 mg 100 mL/hr over 60 Minutes Intravenous  Once 11/10/19 1152 11/10/19 1218   11/10/19 1030  cefTRIAXone (ROCEPHIN) 1 g in sodium chloride 0.9 % 100 mL IVPB     1 g 200 mL/hr over 30 Minutes Intravenous  Once 11/10/19 1025 11/10/19 1211       Assessment/Plan Splenic flexure diverticulitis with microperforation - 1st bout, never had a colonoscopy - CT scan shows bowel wall thickening with surrounding inflammation and stranding identified surrounding the colon at the splenic flexure with 2 tiny foci of free air   ID - zosyn 3/28>> FEN - decrease IVF, FLD VTE - SCDs, lovenox Foley - none Follow up - TBD  Plan: WBC normalized and pain improved. Advance to full  liquids and then to soft diet as tolerated. Continue IV zosyn today. If doing well will plan to transition to oral antibiotics and possibly home tomorrow.   LOS: 2 days    [facility] 11/12/2019, 8:05 AM Please see Amion for pager number during day hours 7:00am-4:30pm

## 2019-11-12 NOTE — Discharge Instructions (Signed)
Diverticulitis  Diverticulitis is when small pockets in your large intestine (colon) get infected or swollen. This causes stomach pain and watery poop (diarrhea). These pouches are called diverticula. They form in people who have a condition called diverticulosis. Follow these instructions at home: Medicines  Take over-the-counter and prescription medicines only as told by your doctor. These include: ? Antibiotics. ? Pain medicines. ? Fiber pills. ? Probiotics. ? Stool softeners.  Do not drive or use heavy machinery while taking prescription pain medicine.  If you were prescribed an antibiotic, take it as told. Do not stop taking it even if you feel better. General instructions   Follow a diet as told by your doctor.  When you feel better, your doctor may tell you to change your diet. You may need to eat a lot of fiber. Fiber makes it easier to poop (have bowel movements). Healthy foods with fiber include: ? Berries. ? Beans. ? Lentils. ? Green vegetables.  Exercise 3 or more times a week. Aim for 30 minutes each time. Exercise enough to sweat and make your heart beat faster.  Keep all follow-up visits as told. This is important. You may need to have an exam of the large intestine. This is called a colonoscopy. Contact a doctor if:  Your pain does not get better.  You have a hard time eating or drinking.  You are not pooping like normal. Get help right away if:  Your pain gets worse.  Your problems do not get better.  Your problems get worse very fast.  You have a fever.  You throw up (vomit) more than one time.  You have poop that is: ? Bloody. ? Black. ? Tarry. Summary  Diverticulitis is when small pockets in your large intestine (colon) get infected or swollen.  Take medicines only as told by your doctor.  Follow a diet as told by your doctor. This information is not intended to replace advice given to you by your health care provider. Make sure you  discuss any questions you have with your health care provider. Document Revised: 07/14/2017 Document Reviewed: 08/18/2016 Elsevier Patient Education  2020 Elsevier Inc.   Low-Fiber Eating Plan Fiber is found in fruits, vegetables, whole grains, and beans. Eating a diet low in fiber helps to reduce how often you have bowel movements and how much you produce during a bowel movement. A low-fiber eating plan may help your digestive system heal if:  You have certain conditions, such as Crohn's disease or diverticulitis.  You recently had radiation therapy on your pelvis or bowel.  You recently had intestinal surgery.  You have a new surgical opening in your abdomen (colostomy or ileostomy).  Your intestine is narrowed (stricture). Your health care provider will determine how long you need to stay on this diet. Your health care provider may recommend that you work with a diet and nutrition specialist (dietitian). What are tips for following this plan? General guidelines  Follow recommendations from your dietitian about how much fiber you should have each day.  Most people on this eating plan should try to eat less than 10 grams (g) of fiber each day. Your daily fiber goal is _________________ g.  Take vitamin and mineral supplements as told by your health care provider or dietitian. Chewable or liquid forms are best when on this eating plan. Reading food labels  Check food labels for the amount of dietary fiber.  Choose foods that have less than 2 grams of fiber in one serving.   Cooking  Use white flour and other allowed grains for baking and cooking.  Cook meat using methods that keep it tender, such as braising or poaching.  Cook eggs until the yolk is completely solid.  Cook with healthy oils, such as olive oil or canola oil. Meal planning   Eat 5-6 small meals throughout the day instead of 3 large meals.  If you are lactose intolerant: ? Choose low-lactose dairy  foods. ? Do not eat dairy foods, if told by your dietitian.  Limit fat and oils to less than 8 teaspoons a day.  Eat small portions of desserts. What foods are allowed? The items listed below may not be a complete list. Talk with your dietitian about what dietary choices are best for you. Grains All bread and crackers made with white flour. Waffles, pancakes, and French toast. Bagels. Pretzels. Melba toast, zwieback, and matzoh. Cooked and dried cereals that do not contain whole grains, added fiber, seeds, or dried fruit. Cornmeal. Farina. Hot and cold cereals made with refined corn, wheat, rice, or oats. Plain pasta and noodles. White rice. Vegetables Well-cooked or canned vegetables without skin, seeds, or stems. Cooked potatoes without skins. Vegetable juice. Fruits Soft-cooked or canned fruits without skin and seeds. Peeled ripe banana. Applesauce. Fruit juice without pulp. Meats and other protein foods Ground meat. Tender cuts of meat or poultry. Eggs. Fish, seafood, and shellfish. Smooth nut butters. Tofu. Dairy All milk products and drinks. Lactose-free milks, including rice, soy, and almond milks. Yogurt without fruit, nuts, chocolate, or granola mix-ins. Sour cream. Cottage cheese. Cheese. Beverages Decaf coffee. Fruit and vegetable juices or smoothies (in small amounts, with no pulp or skins, and with fruits from allowed list). Sports drinks. Herbal tea. Fats and oils Olive oil, canola oil, sunflower oil, flaxseed oil, and grapeseed oil. Mayonnaise. Cream cheese. Margarine. Butter. Sweets and desserts Plain cakes and cookies. Cream pies and pies made with allowed fruits. Pudding. Custard. Fruit gelatin. Sherbet. Popsicles. Ice cream without nuts. Plain hard candy. Honey. Jelly. Molasses. Syrups, including chocolate syrup. Chocolate. Marshmallows. Gumdrops. Seasoning and other foods Bouillon. Broth. Cream soups made from allowed foods. Strained soup. Casseroles made with allowed  foods. Ketchup. Mild mustard. Mild salad dressings. Plain gravies. Vinegar. Spices in moderation. Salt. Sugar. What foods are not allowed? The items listed below may not be a complete list. Talk with your dietitian about what dietary choices are best for you. Grains Whole wheat and whole grain breads and crackers. Multigrain breads and crackers. Rye bread. Whole grain or multigrain cereals. Cereals with nuts, raisins, or coconut. Bran. Coarse wheat cereals. Granola. High-fiber cereals. Cornmeal or corn bread. Whole grain pasta. Wild or brown rice. Quinoa. Popcorn. Buckwheat. Wheat germ. Vegetables Potato skins. Raw or undercooked vegetables. All beans and bean sprouts. Cooked greens. Corn. Peas. Cabbage. Beets. Broccoli. Brussels sprouts. Cauliflower. Mushrooms. Onions. Peppers. Parsnips. Okra. Sauerkraut. Fruit Raw or dried fruit. Berries. Fruit juice with pulp. Prune juice. Meats and other protein foods Tough, fibrous meats with gristle. Fatty meat. Poultry with skin. Fried meat, poultry, or fish. Deli or lunch meats. Sausage, bacon, and hot dogs. Nuts and chunky nut butter. Dried peas, beans, and lentils. Dairy Yogurt with fruit, nuts, chocolate, or granola mix-ins. Beverages Caffeinated coffee and teas. Fats and oils Avocado. Coconut. Sweets and desserts Desserts, cookies, or candies that contain nuts or coconut. Dried fruit. Jams and preserves with seeds. Marmalade. Any dessert made with fruits or grains that are not allowed. Seasoning and other foods Corn tortilla chips.   Soups made with vegetables or grains that are not allowed. Relish. Horseradish. Pickles. Olives. Summary  Most people on a low-fiber eating plan should eat less than 10 grams of fiber a day. Follow recommendations from your dietitian about how much fiber you should have each day.  Always check food labels to see the dietary fiber content of packaged foods. In general, a low-fiber food will have fewer than 2 grams of  fiber per serving.  In general, try to avoid whole grains, raw fruits and vegetables, dried fruit, tough cuts of meat, nuts, and seeds.  Take a vitamin and mineral supplement as told by your health care provider or dietitian. This information is not intended to replace advice given to you by your health care provider. Make sure you discuss any questions you have with your health care provider. Document Revised: 11/23/2018 Document Reviewed: 10/04/2016 Elsevier Patient Education  2020 Elsevier Inc.   High-Fiber Diet Fiber, also called dietary fiber, is a type of carbohydrate that is found in fruits, vegetables, whole grains, and beans. A high-fiber diet can have many health benefits. Your health care provider may recommend a high-fiber diet to help:  Prevent constipation. Fiber can make your bowel movements more regular.  Lower your cholesterol.  Relieve the following conditions: ? Swelling of veins in the anus (hemorrhoids). ? Swelling and irritation (inflammation) of specific areas of the digestive tract (uncomplicated diverticulosis). ? A problem of the large intestine (colon) that sometimes causes pain and diarrhea (irritable bowel syndrome, IBS).  Prevent overeating as part of a weight-loss plan.  Prevent heart disease, type 2 diabetes, and certain cancers. What is my plan? The recommended daily fiber intake in grams (g) includes:  38 g for men age 50 or younger.  30 g for men over age 50.  25 g for women age 50 or younger.  21 g for women over age 50. You can get the recommended daily intake of dietary fiber by:  Eating a variety of fruits, vegetables, grains, and beans.  Taking a fiber supplement, if it is not possible to get enough fiber through your diet. What do I need to know about a high-fiber diet?  It is better to get fiber through food sources rather than from fiber supplements. There is not a lot of research about how effective supplements are.  Always  check the fiber content on the nutrition facts label of any prepackaged food. Look for foods that contain 5 g of fiber or more per serving.  Talk with a diet and nutrition specialist (dietitian) if you have questions about specific foods that are recommended or not recommended for your medical condition, especially if those foods are not listed below.  Gradually increase how much fiber you consume. If you increase your intake of dietary fiber too quickly, you may have bloating, cramping, or gas.  Drink plenty of water. Water helps you to digest fiber. What are tips for following this plan?  Eat a wide variety of high-fiber foods.  Make sure that half of the grains that you eat each day are whole grains.  Eat breads and cereals that are made with whole-grain flour instead of refined flour or white flour.  Eat brown rice, bulgur wheat, or millet instead of white rice.  Start the day with a breakfast that is high in fiber, such as a cereal that contains 5 g of fiber or more per serving.  Use beans in place of meat in soups, salads, and pasta dishes.    Eat high-fiber snacks, such as berries, raw vegetables, nuts, and popcorn.  Choose whole fruits and vegetables instead of processed forms like juice or sauce. What foods can I eat?  Fruits Berries. Pears. Apples. Oranges. Avocado. Prunes and raisins. Dried figs. Vegetables Sweet potatoes. Spinach. Kale. Artichokes. Cabbage. Broccoli. Cauliflower. Green peas. Carrots. Squash. Grains Whole-grain breads. Multigrain cereal. Oats and oatmeal. Brown rice. Barley. Bulgur wheat. Millet. Quinoa. Bran muffins. Popcorn. Rye wafer crackers. Meats and other proteins Navy, kidney, and pinto beans. Soybeans. Split peas. Lentils. Nuts and seeds. Dairy Fiber-fortified yogurt. Beverages Fiber-fortified soy milk. Fiber-fortified orange juice. Other foods Fiber bars. The items listed above may not be a complete list of recommended foods and beverages.  Contact a dietitian for more options. What foods are not recommended? Fruits Fruit juice. Cooked, strained fruit. Vegetables Fried potatoes. Canned vegetables. Well-cooked vegetables. Grains White bread. Pasta made with refined flour. White rice. Meats and other proteins Fatty cuts of meat. Fried chicken or fried fish. Dairy Milk. Yogurt. Cream cheese. Sour cream. Fats and oils Butters. Beverages Soft drinks. Other foods Cakes and pastries. The items listed above may not be a complete list of foods and beverages to avoid. Contact a dietitian for more information. Summary  Fiber is a type of carbohydrate. It is found in fruits, vegetables, whole grains, and beans.  There are many health benefits of eating a high-fiber diet, such as preventing constipation, lowering blood cholesterol, helping with weight loss, and reducing your risk of heart disease, diabetes, and certain cancers.  Gradually increase your intake of fiber. Increasing too fast can result in cramping, bloating, and gas. Drink plenty of water while you increase your fiber.  The best sources of fiber include whole fruits and vegetables, whole grains, nuts, seeds, and beans. This information is not intended to replace advice given to you by your health care provider. Make sure you discuss any questions you have with your health care provider. Document Revised: 06/05/2017 Document Reviewed: 06/05/2017 Elsevier Patient Education  2020 Elsevier Inc.  

## 2019-11-12 NOTE — Plan of Care (Signed)
  Problem: Education: Goal: Knowledge of General Education information will improve Description: Including pain rating scale, medication(s)/side effects and non-pharmacologic comfort measures Outcome: Progressing   Problem: Health Behavior/Discharge Planning: Goal: Ability to manage health-related needs will improve Outcome: Progressing   Problem: Clinical Measurements: Goal: Ability to maintain clinical measurements within normal limits will improve Outcome: Progressing Goal: Will remain free from infection Outcome: Progressing Goal: Respiratory complications will improve Outcome: Progressing   Problem: Activity: Goal: Risk for activity intolerance will decrease Outcome: Progressing   Problem: Elimination: Goal: Will not experience complications related to bowel motility Outcome: Progressing   Problem: Pain Managment: Goal: General experience of comfort will improve Outcome: Progressing   Problem: Safety: Goal: Ability to remain free from injury will improve Outcome: Progressing   Problem: Skin Integrity: Goal: Risk for impaired skin integrity will decrease Outcome: Progressing

## 2019-11-13 MED ORDER — SACCHAROMYCES BOULARDII 250 MG PO CAPS: 250.0000 mg | ORAL_CAPSULE | Freq: Two times a day (BID) | ORAL | Status: DC

## 2019-11-13 MED ORDER — AMOXICILLIN-POT CLAVULANATE 875-125 MG PO TABS: 1.0000 | ORAL_TABLET | Freq: Two times a day (BID) | ORAL | 0 refills | Status: AC

## 2019-11-13 MED ORDER — POLYETHYLENE GLYCOL 3350 17 G PO PACK: 17.0000 g | PACK | Freq: Every day | ORAL | 0 refills | Status: DC | PRN

## 2019-11-13 NOTE — Progress Notes (Signed)
VS stable, denies pain, education was given. AVS paper discussed, answered all the questions. Belongings at the bedside.  Pt is waiting on the ride to DC home

## 2019-11-13 NOTE — Discharge Summary (Signed)
[facility] Discharge Summary   Patient ID: Erin Benjamin MRN: [id number] DOB/AGE: Mar 31, 1991 29 y.o.  Admit date: 11/10/2019 Discharge date: 11/13/2019  Admitting Diagnosis: Splenic flexure diverticulitis with microperforation  Discharge Diagnosis Patient Active Problem List   Diagnosis Date Noted  . Diverticulitis of colon with perforation 11/10/2019  . Cholelithiasis with acute cholecystitis 07/30/2018  . Post-operative state 11/18/2017  . Premature rupture of membranes 11/17/2017  . Encounter for induction of labor 11/17/2017  . Bacterial vaginosis 06/15/2017  . Previous cesarean delivery affecting pregnancy, antepartum 05/29/2017  . Former cigarette smoker 05/18/2017    Consultants None  Imaging: No results found.  Procedures None  Hospital Course:  Erin Benjamin is a 28yo female who presented to [facility] 3/28 with 2 days of left upper abdominal pain.  Workup included CT scan which showed splenic flexure diverticulitis with microperforation.  Patient was admitted for bowel rest and IV antibiotics. WBC normalized and abdominal pain resolved. Diet was advanced as tolerated.  On 3/31 tolerating diet, having bowel fuction, pain resolved, vital signs stable and felt stable for discharge home.  She will go home with 14 days of augmentin and be referred to gastroenterology for a colonoscopy in 6-8 weeks. Patient will follow up as below and knows to call with questions or concerns.     Physical Exam: Gen: Alert, NAD, pleasant HEENT: EOM's intact, pupils equal and round Card: RRR Pulm: CTAB, no W/R/R, rate and effort normal Abd: Soft, ND,nontender,+BS, no HSM Skin: no rashes noted, warm and dry  Allergies as of 11/13/2019   No Known Allergies     Medication List    TAKE these medications   acetaminophen 500 MG tablet Commonly known as: TYLENOL You can take 1000 mg of plain Tylenol(acetaminophen) every 8 hours as needed for pain.  You can buy this  over-the-counter at any drugstore without a prescription.  Not exceed 4000 mg of Tylenol per day it can harm your liver.   amoxicillin-clavulanate 875-125 MG tablet Commonly known as: Augmentin Take 1 tablet by mouth 2 (two) times daily for 14 days.   ibuprofen 200 MG tablet Commonly known as: ADVIL You can take 2 to 3 tablets every 6 hours as needed for pain.  This should be your second drug, if plain Tylenol is ineffective.  You can buy this over-the-counter at any drugstore without a prescription.   nitrofurantoin (macrocrystal-monohydrate) 100 MG capsule Commonly known as: MACROBID Take 100 mg by mouth 2 (two) times daily.   phenazopyridine 100 MG tablet Commonly known as: PYRIDIUM Take 100 mg by mouth 3 (three) times daily as needed for pain.   polyethylene glycol 17 g packet Commonly known as: MIRALAX / GLYCOLAX Take 17 g by mouth daily as needed for mild constipation.   saccharomyces boulardii 250 MG capsule Commonly known as: FLORASTOR Take 1 capsule (250 mg total) by mouth 2 (two) times daily. You can get a probiotic over the counter        Follow-up Information    [facility]. Schedule an appointment as soon as possible for a visit.   Why: Appointment November 21, 2019 at 3:15 pm  Contact information: [address] [place] [address] [phone]       [facility]. Call.   Specialty: Gastroenterology Why: Their office should call you to set up a colonoscopy Contact information: [address] [place] [address] [phone]       [facility], [place]. Call.  Specialty: General Surgery Why: Call after your colonoscopy to schedule appointment with colorectal surgeon Contact information: [address] [place] [phone]          Signed: Wellington Hampshire, [facility] 11/13/2019, 8:20  AM Please see Amion for pager number during day hours 7:00am-4:30pm

## 2019-11-20 NOTE — Progress Notes (Signed)
Patient ID: Erin Benjamin, female   DOB: 1990-10-14, 29 y.o.   MRN: [id number]  Virtual Visit via Telephone Note  I connected with [name] on 11/21/19 at  3:10 PM EDT by telephone and verified that I am speaking with the correct person using two identifiers.   I discussed the limitations, risks, security and privacy concerns of performing an evaluation and management service by telephone and the availability of in person appointments. I also discussed with the patient that there may be a patient responsible charge related to this service. The patient expressed understanding and agreed to proceed.  PATIENT visit by telephone virtually in the context of Covid-19 pandemic. Patient location:  home My Location:  [facility] office Persons on the call:  Me and the patient   History of Present Illness: After hospitalization 3/28-3/31/2021 for diverticulitis.  Patient is feeling much better and is eating much better.  Appetite is good.  No fever.  No abdominal pain.  She has not made an appt with GI yet.  Medicaid is pending.  BMs normal.  She is walking daily and feeling much better overall.  She is taking the augmentin bid and has about 6 days worth left.  She may be developing a yeast infection.  From discharge summary: Hospital Course:  [name] is a 28yo female who presented to [facility] 3/28 with 2 days of left upper abdominal pain.  Workup included CT scan which showed splenic flexure diverticulitis with microperforation.  Patient was admitted for bowel rest and IV antibiotics. WBC normalized and abdominal pain resolved. Diet was advanced as tolerated.  On 3/31 tolerating diet, having bowel fuction, pain resolved, vital signs stable and felt stable for discharge home.  She will go home with 14 days of augmentin and be referred to gastroenterology for a colonoscopy in 6-8 weeks. Patient will follow up as below and knows to call with questions or concerns.     Observations/Objective:  NAD.   A&Ox3   Assessment and Plan:   1. Yeast infection status on heavy antibiotic regimen - fluconazole (DIFLUCAN) 150 MG tablet; Take 1 tablet (150 mg total) by mouth once for 1 dose. Repeat  In 1 week  Dispense: 2 tablet; Refill: 0  2. Diverticulitis Resolving;  Complete antibiotics - Ambulatory referral to Gastroenterology  3. Hospital discharge follow-up Doing well.     Follow Up Instructions: Assign PCP in about 6 weeks   I discussed the assessment and treatment plan with the patient. The patient was provided an opportunity to ask questions and all were answered. The patient agreed with the plan and demonstrated an understanding of the instructions.   The patient was advised to call back or seek an in-person evaluation if the symptoms worsen or if the condition fails to improve as anticipated.  I provided 13 minutes of non-face-to-face time during this encounter.   [place]ian Co, PA-C

## 2019-11-21 ENCOUNTER — Other Ambulatory Visit: Payer: Self-pay

## 2019-11-21 ENCOUNTER — Ambulatory Visit: Payer: Self-pay | Attending: Family Medicine | Admitting: Physician Assistant

## 2019-11-21 ENCOUNTER — Encounter: Payer: Self-pay | Admitting: Nurse Practitioner

## 2019-11-21 DIAGNOSIS — Z09 Encounter for follow-up examination after completed treatment for conditions other than malignant neoplasm: Secondary | ICD-10-CM

## 2019-11-21 DIAGNOSIS — K5792 Diverticulitis of intestine, part unspecified, without perforation or abscess without bleeding: Secondary | ICD-10-CM

## 2019-11-21 DIAGNOSIS — B379 Candidiasis, unspecified: Secondary | ICD-10-CM

## 2019-11-21 MED ORDER — FLUCONAZOLE 150 MG PO TABS: 150.0000 mg | ORAL_TABLET | Freq: Once | ORAL | 0 refills | Status: AC

## 2019-12-04 ENCOUNTER — Ambulatory Visit: Payer: Self-pay | Admitting: Nurse Practitioner

## 2020-06-04 ENCOUNTER — Emergency Department (HOSPITAL_COMMUNITY)
Admission: EM | Admit: 2020-06-04 | Discharge: 2020-06-04 | Disposition: A | Discharge status: Home / Self Care | Payer: Medicaid Other | Attending: Emergency Medicine | Admitting: Emergency Medicine

## 2020-06-04 ENCOUNTER — Encounter (HOSPITAL_COMMUNITY): Payer: Self-pay | Admitting: *Deleted

## 2020-06-04 ENCOUNTER — Emergency Department (HOSPITAL_COMMUNITY): Payer: Medicaid Other

## 2020-06-04 ENCOUNTER — Other Ambulatory Visit: Payer: Self-pay

## 2020-06-04 DIAGNOSIS — Z87891 Personal history of nicotine dependence: Secondary | ICD-10-CM | POA: Insufficient documentation

## 2020-06-04 DIAGNOSIS — S99911A Unspecified injury of right ankle, initial encounter: Secondary | ICD-10-CM | POA: Diagnosis present

## 2020-06-04 DIAGNOSIS — S93401A Sprain of unspecified ligament of right ankle, initial encounter: Secondary | ICD-10-CM | POA: Diagnosis not present

## 2020-06-04 DIAGNOSIS — W108XXA Fall (on) (from) other stairs and steps, initial encounter: Secondary | ICD-10-CM | POA: Insufficient documentation

## 2020-06-04 DIAGNOSIS — M25571 Pain in right ankle and joints of right foot: Secondary | ICD-10-CM

## 2020-06-04 NOTE — ED Triage Notes (Signed)
Right ankle pain since she twisted her ankle Sunday.

## 2020-06-04 NOTE — ED Provider Notes (Signed)
[facility] EMERGENCY DEPARTMENT Provider Note   CSN: [id number] Arrival date & time: 06/04/20  1615     History Chief Complaint  Patient presents with   Ankle Pain    Shanna Un is a 29 y.o. female.  The history is provided by the patient and medical records. No language interpreter was used.  Ankle Pain Location:  Ankle Time since incident:  5 days Injury: yes   Mechanism of injury: fall   Fall:    Fall occurred:  Down stairs   Entrapped after fall: no   Ankle location:  R ankle Pain details:    Quality:  Aching   Radiates to:  Does not radiate   Severity:  Moderate   Onset quality:  Sudden   Timing:  Constant   Progression:  Unchanged Chronicity:  New Prior injury to area:  Yes Relieved by:  Nothing Ineffective treatments:  None tried Associated symptoms: no back pain, no decreased ROM, no fatigue, no fever, no muscle weakness, no neck pain, no numbness and no stiffness   Risk factors: no frequent fractures        Past Medical History:  Diagnosis Date   Medical history non-contributory     Patient Active Problem List   Diagnosis Date Noted   Diverticulitis of colon with perforation 11/10/2019   Cholelithiasis with acute cholecystitis 07/30/2018   Post-operative state 11/18/2017   Premature rupture of membranes 11/17/2017   Encounter for induction of labor 11/17/2017   Bacterial vaginosis 06/15/2017   Previous cesarean delivery affecting pregnancy, antepartum 05/29/2017   Former cigarette smoker 05/18/2017    Past Surgical History:  Procedure Laterality Date   CESAREAN SECTION  08/2014   CESAREAN SECTION N/A 11/18/2017   Procedure: CESAREAN SECTION;  Surgeon: Hermina Staggers, MD;  Location: [facility] BIRTHING SUITES;  Service: Obstetrics;  Laterality: N/A;   CHOLECYSTECTOMY N/A 07/30/2018   Procedure: LAPAROSCOPIC CHOLECYSTECTOMY WITH INTRAOPERATIVE CHOLANGIOGRAM;  Surgeon: Harriette Bouillon, MD;  Location: [facility] OR;  Service:  General;  Laterality: N/A;     OB History    Gravida  2   Para  2   Term  2   Preterm  0   AB  0   Living  2     SAB      TAB      Ectopic      Multiple  0   Live Births  2           Family History  Problem Relation Age of Onset   Diabetes Mother     Social History   Tobacco Use   Smoking status: Former Smoker    Packs/day: 1.00    Types: Cigarettes    Quit date: 02/20/2017    Years since quitting: 3.2   Smokeless tobacco: Never Used   Tobacco comment: stopped with pregnancy  Vaping Use   Vaping Use: Every day  Substance Use Topics   Alcohol use: Not Currently    Comment:     Drug use: Yes    Types: Marijuana    Comment: stopped 02/2017    Home Medications Prior to Admission medications   Medication Sig Start Date End Date Taking? Authorizing Provider  acetaminophen (TYLENOL) 500 MG tablet You can take 1000 mg of plain Tylenol(acetaminophen) every 8 hours as needed for pain.  You can buy this over-the-counter at any drugstore without a prescription.  Not exceed 4000 mg of Tylenol per day it can harm your liver. Patient not  taking: Reported on 11/10/2019 07/31/18   Sherrie George, PA-C  ibuprofen (ADVIL,MOTRIN) 200 MG tablet You can take 2 to 3 tablets every 6 hours as needed for pain.  This should be your second drug, if plain Tylenol is ineffective.  You can buy this over-the-counter at any drugstore without a prescription. Patient not taking: Reported on 11/10/2019 07/31/18   Sherrie George, PA-C  phenazopyridine (PYRIDIUM) 100 MG tablet Take 100 mg by mouth 3 (three) times daily as needed for pain.    [provider]  polyethylene glycol (MIRALAX / GLYCOLAX) 17 g packet Take 17 g by mouth daily as needed for mild constipation. Patient not taking: Reported on 11/21/2019 11/13/19   Franne Forts, PA-C  saccharomyces boulardii (FLORASTOR) 250 MG capsule Take 1 capsule (250 mg total) by mouth 2 (two) times daily. You can get a  probiotic over the counter Patient not taking: Reported on 11/21/2019 11/13/19   Franne Forts, PA-C    Allergies    Patient has no known allergies.  Review of Systems   Review of Systems  Constitutional: Negative for chills, diaphoresis, fatigue and fever.  HENT: Negative for congestion.   Respiratory: Negative for cough, chest tightness, shortness of breath and wheezing.   Cardiovascular: Negative for chest pain and palpitations.  Gastrointestinal: Negative for constipation, diarrhea, nausea and vomiting.  Musculoskeletal: Negative for back pain, neck pain, neck stiffness and stiffness.  Skin: Negative for rash and wound.  Neurological: Negative for headaches.  All other systems reviewed and are negative.   Physical Exam Updated Vital Signs BP 126/89 (BP Location: Left Arm)    Pulse 63    Temp 98.9 F (37.2 C) (Oral)    Resp 20    Ht 5\' 8"  (1.727 m)    Wt 95.3 kg    LMP 04/30/2020 (Approximate)    SpO2 100%    BMI 31.93 kg/m   Physical Exam Vitals and nursing note reviewed.  Constitutional:      General: She is not in acute distress.    Appearance: She is not ill-appearing, toxic-appearing or diaphoretic.  HENT:     Head: Normocephalic.     Nose: Nose normal.     Mouth/Throat:     Pharynx: Oropharynx is clear.  Eyes:     Conjunctiva/sclera: Conjunctivae normal.  Cardiovascular:     Rate and Rhythm: Normal rate.     Pulses: Normal pulses.     Heart sounds: No murmur heard.   Pulmonary:     Effort: Pulmonary effort is normal.     Breath sounds: No wheezing, rhonchi or rales.  Chest:     Chest wall: No tenderness.  Abdominal:     Tenderness: There is no abdominal tenderness.  Musculoskeletal:        General: Tenderness present.     Cervical back: No tenderness.     Right lower leg: No edema.     Left lower leg: No edema.     Right ankle: Tenderness present.       Legs:     Comments: Tenderness in right lateral ankle.  No crepitance.  Normal range of motion,  sensation, strength, and pulses present.  No bruising.  Normal capillary refill.  No other tenderness in the leg.  Skin:    Capillary Refill: Capillary refill takes less than 2 seconds.     Findings: No erythema.  Neurological:     General: No focal deficit present.     Mental Status: She is  alert.  Psychiatric:        Mood and Affect: Mood normal.     ED Results / Procedures / Treatments   Labs (all labs ordered are listed, but only abnormal results are displayed) Labs Reviewed - No data to display  EKG None  Radiology DG Ankle Complete Right  Result Date: 06/04/2020 CLINICAL DATA:  Pain, twisted ankle EXAM: RIGHT ANKLE - COMPLETE 3+ VIEW COMPARISON:  None FINDINGS: Minimal lateral soft tissue swelling. Joint spaces preserved. Small indeterminate calcific density is seen at the tibiotalar joint at the junction of the tibia and fibula on the oblique view, uncertain if represents an ossicle/calcified body versus an atypical fracture fragment. This is not seen on the remaining views. No additional focal osseous abnormalities identified. IMPRESSION: Small indeterminate calcific density identified at the tibiotalar joint at the junction of the tibia and fibula, question small calcified body/ossicle versus atypical fracture fragment. Electronically Signed   By: Ulyses SouthwardMark  Boles M.D.   On: 06/04/2020 17:06    Procedures Procedures (including critical care time)  Medications Ordered in ED Medications - No data to display  ED Course  I have reviewed the triage vital signs and the nursing notes.  Pertinent labs & imaging results that were available during my care of the patient were reviewed by me and considered in my medical decision making (see chart for details).    MDM Rules/Calculators/A&P                          Malena Catholicumama Boulanger is a 29 y.o. female with a past medical history significant for prior cholecystitis status post cholecystectomy and prior diverticulitis who presents with  right ankle pain after a fall.  She reports that she was walking down some stairs and twisted her right ankle on Sunday, 5 days ago.  She reports that she is try to walk on it but has significant pain.  She reports he is able to walk but has not been able to run concerning her.  She reports no other injuries or other complaints.  She reports the pain is moderate.  She does not take medicines and does not want any medication.  She reports she chronically has some pain in that ankle on and off after a volleyball injury years ago.  She denies numbness, tingling, or weakness.  On exam, lungs clear and chest nontender.  Abdomen nontender.  Patient had good sensation and strength in bilateral lower extremities.  She had good DP and PT pulse.  She had no open injury.  She had some tenderness in the lateral right ankle.  No significant swelling appreciated.  No ecchymosis.  No tenderness in the posterior ankle.  Exam otherwise unremarkable.  Patient had imaging showing either atypical fracture versus ossified ossicle or atypical bony fragment.  Patient thinks that may have been from previous injury and she think she just plantar ankle.  Patient will be given crutches and ASO and will follow up with PCP and orthopedics.  She agreed with plan of care as I suspect this is more of a sprain.  She will use RICE therapy and agreed with management.  She had no other questions or concerns and was discharged in good condition.   Final Clinical Impression(s) / ED Diagnoses Final diagnoses:  Sprain of right ankle, unspecified ligament, initial encounter  Fall down stairs, initial encounter  Acute right ankle pain    Clinical Impression: 1. Sprain of right ankle, unspecified ligament,  initial encounter   2. Fall down stairs, initial encounter   3. Acute right ankle pain     Disposition: Discharge  Condition: Good  I have discussed the results, Dx and Tx plan with the pt(& family if present). He/she/they  expressed understanding and agree(s) with the plan. Discharge instructions discussed at great length. Strict return precautions discussed and pt &/or family have verbalized understanding of the instructions. No further questions at time of discharge.    New Prescriptions   No medications on file    Follow Up: [facility] [address] [place] [address] [phone] Schedule an appointment as soon as possible for a visit    [facility] EMERGENCY DEPARTMENT [address] [id number] [place] [address] [phone]    Specialists, [facility] [address] [place] [address] [phone]        Roan Sawchuk, Canary Brim, MD 06/04/20 2035

## 2020-06-04 NOTE — Discharge Instructions (Signed)
Your x-ray today showed possible old bony changes versus atypical fracture.  Given your description of symptoms, I suspect this is more of a severe ankle sprain on your chronically injured ankle.  Please rest, ice, and elevate the ankle.  Please use the brace.  Please follow-up with orthopedics if it continues to give you troubles.  If any symptoms change or worsen, please return to the nearest emergency department.

## 2020-06-09 ENCOUNTER — Other Ambulatory Visit: Payer: Self-pay

## 2020-06-09 ENCOUNTER — Ambulatory Visit
Admission: RE | Admit: 2020-06-09 | Discharge: 2020-06-09 | Disposition: A | Discharge status: Home / Self Care | Payer: Medicaid Other | Source: Ambulatory Visit | Attending: Physician Assistant | Admitting: Physician Assistant

## 2020-06-09 VITALS — BP 125/92 | HR 77 | Temp 98.3°F | Resp 18

## 2020-06-09 DIAGNOSIS — N898 Other specified noninflammatory disorders of vagina: Secondary | ICD-10-CM | POA: Diagnosis not present

## 2020-06-09 DIAGNOSIS — R399 Unspecified symptoms and signs involving the genitourinary system: Secondary | ICD-10-CM | POA: Diagnosis present

## 2020-06-09 DIAGNOSIS — K5900 Constipation, unspecified: Secondary | ICD-10-CM | POA: Insufficient documentation

## 2020-06-09 LAB — POCT URINALYSIS DIP (MANUAL ENTRY)
Bilirubin, UA: NEGATIVE
Blood, UA: NEGATIVE
Glucose, UA: NEGATIVE mg/dL
Ketones, POC UA: NEGATIVE mg/dL
Leukocytes, UA: NEGATIVE
Nitrite, UA: NEGATIVE
Protein Ur, POC: NEGATIVE mg/dL
Spec Grav, UA: 1.025 (ref 1.010–1.025)
Urobilinogen, UA: 1 E.U./dL
pH, UA: 7 (ref 5.0–8.0)

## 2020-06-09 LAB — POCT URINE PREGNANCY: Preg Test, Ur: NEGATIVE

## 2020-06-09 MED ORDER — POLYETHYLENE GLYCOL 3350 17 G PO PACK: 17.0000 g | PACK | Freq: Every day | ORAL | 0 refills | Status: DC

## 2020-06-09 MED ORDER — METRONIDAZOLE 500 MG PO TABS: 500.0000 mg | ORAL_TABLET | Freq: Two times a day (BID) | ORAL | 0 refills | Status: DC

## 2020-06-09 NOTE — Discharge Instructions (Signed)
Urine negative for infection. You were treated empirically for BV, flagyl as directed. Cytology sent, you will be contacted with any positive results that requires further treatment. Refrain from sexual activity and alcohol use for the next 7 days. Monitor for any worsening of symptoms, fever, abdominal pain, nausea, vomiting, to follow up for reevaluation.  Miralax for constipation.

## 2020-06-09 NOTE — ED Triage Notes (Signed)
Pt c/o burning on urination with frequency and urgency x3 days. Pt states also having a white/milky discharge x3 days. States has had un protective intercourse.

## 2020-06-09 NOTE — ED Provider Notes (Signed)
[facility]    CSN: [id number] Arrival date & time: 06/09/20  1300      History   Chief Complaint Chief Complaint  Patient presents with   appt 1-UTI   SEXUALLY TRANSMITTED DISEASE    HPI Erin Benjamin is a 29 y.o. female.   29 year old female comes in for 3 day history of urinary symptoms and vaginal symptoms. Has had dysuria, frequency, urgency. Has also had white/milky discharge with odor. Denies vaginal itching. Denies abdominal pain, nausea, vomiting. Denies fever, chills, flank/back pain. LMP 05/01/2020. Sexually active with one female partner, no condom use. Last BM yesterday with straining and small/hard stools.      Past Medical History:  Diagnosis Date   Medical history non-contributory     Patient Active Problem List   Diagnosis Date Noted   Diverticulitis of colon with perforation 11/10/2019   Cholelithiasis with acute cholecystitis 07/30/2018   Post-operative state 11/18/2017   Premature rupture of membranes 11/17/2017   Encounter for induction of labor 11/17/2017   Bacterial vaginosis 06/15/2017   Previous cesarean delivery affecting pregnancy, antepartum 05/29/2017   Former cigarette smoker 05/18/2017    Past Surgical History:  Procedure Laterality Date   CESAREAN SECTION  08/2014   CESAREAN SECTION N/A 11/18/2017   Procedure: CESAREAN SECTION;  Surgeon: Hermina Staggers, MD;  Location: [facility];  Service: Obstetrics;  Laterality: N/A;   CHOLECYSTECTOMY N/A 07/30/2018   Procedure: LAPAROSCOPIC CHOLECYSTECTOMY WITH INTRAOPERATIVE CHOLANGIOGRAM;  Surgeon: Harriette Bouillon, MD;  Location: [facility] OR;  Service: General;  Laterality: N/A;    OB History    Gravida  2   Para  2   Term  2   Preterm  0   AB  0   Living  2     SAB      TAB      Ectopic      Multiple  0   Live Births  2            Home Medications    Prior to Admission medications   Medication Sig Start Date End Date Taking? Authorizing  Provider  metroNIDAZOLE (FLAGYL) 500 MG tablet Take 1 tablet (500 mg total) by mouth 2 (two) times daily. 06/09/20   Cathie Hoops, Chloe Miyoshi V, PA-C  polyethylene glycol (MIRALAX) 17 g packet Take 17 g by mouth daily. 06/09/20   Belinda Fisher, PA-C    Family History Family History  Problem Relation Age of Onset   Diabetes Mother     Social History Social History   Tobacco Use   Smoking status: Former Smoker    Packs/day: 1.00    Types: Cigarettes    Quit date: 02/20/2017    Years since quitting: 3.3   Smokeless tobacco: Never Used   Tobacco comment: stopped with pregnancy  Vaping Use   Vaping Use: Every day  Substance Use Topics   Alcohol use: Not Currently    Comment:     Drug use: Yes    Types: Marijuana    Comment: stopped 02/2017     Allergies   Patient has no known allergies.   Review of Systems Review of Systems  Reason unable to perform ROS: See HPI as above.     Physical Exam Triage Vital Signs ED Triage Vitals [06/09/20 1339]  Enc Vitals Group     BP (!) 125/92     Pulse Rate 77     Resp 18     Temp 98.3 F (  36.8 C)     Temp Source Oral     SpO2 96 %     Weight      Height      Head Circumference      Peak Flow      Pain Score 0     Pain Loc      Pain Edu?      Excl. in GC?    No data found.  Updated Vital Signs BP (!) 125/92 (BP Location: Left Arm)    Pulse 77    Temp 98.3 F (36.8 C) (Oral)    Resp 18    LMP 05/01/2020    SpO2 96%    Breastfeeding No   Physical Exam Constitutional:      General: She is not in acute distress.    Appearance: She is well-developed. She is not ill-appearing, toxic-appearing or diaphoretic.  HENT:     Head: Normocephalic and atraumatic.  Eyes:     Conjunctiva/sclera: Conjunctivae normal.     Pupils: Pupils are equal, round, and reactive to light.  Cardiovascular:     Rate and Rhythm: Normal rate and regular rhythm.  Pulmonary:     Effort: Pulmonary effort is normal. No respiratory distress.     Comments:  LCTAB Abdominal:     General: Bowel sounds are normal.     Palpations: Abdomen is soft.     Tenderness: There is no abdominal tenderness. There is no right CVA tenderness, left CVA tenderness, guarding or rebound.  Musculoskeletal:     Cervical back: Normal range of motion and neck supple.  Skin:    General: Skin is warm and dry.  Neurological:     Mental Status: She is alert and oriented to person, place, and time.  Psychiatric:        Behavior: Behavior normal.        Judgment: Judgment normal.      UC Treatments / Results  Labs (all labs ordered are listed, but only abnormal results are displayed) Labs Reviewed  POCT URINALYSIS DIP (MANUAL ENTRY) - Abnormal; Notable for the following components:      Result Value   Clarity, UA cloudy (*)    All other components within normal limits  POCT URINE PREGNANCY  CERVICOVAGINAL ANCILLARY ONLY    EKG   Radiology No results found.  Procedures Procedures (including critical care time)  Medications Ordered in UC Medications - No data to display  Initial Impression / Assessment and Plan / UC Course  I have reviewed the triage vital signs and the nursing notes.  Pertinent labs & imaging results that were available during my care of the patient were reviewed by me and considered in my medical decision making (see chart for details).    Urine dipstick negative. Patient was treated empirically for BV, flagyl as directed. Cytology sent, patient will be contacted with any positive results that require additional treatment. Patient to refrain from sexual activity for the next 7 days. Return precautions given.   Final Clinical Impressions(s) / UC Diagnoses   Final diagnoses:  Vaginal discharge  Lower urinary tract symptoms  Constipation, unspecified constipation type   ED Prescriptions    Medication Sig Dispense Auth. Provider   metroNIDAZOLE (FLAGYL) 500 MG tablet Take 1 tablet (500 mg total) by mouth 2 (two) times daily. 14  tablet Dhyan Noah V, PA-C   polyethylene glycol (MIRALAX) 17 g packet Take 17 g by mouth daily. 14 each Belinda Fisher, PA-C  PDMP not reviewed this encounter.   Belinda Fisher, PA-C 06/09/20 1425

## 2020-06-10 LAB — CERVICOVAGINAL ANCILLARY ONLY
Chlamydia: NEGATIVE
Comment: NEGATIVE
Comment: NEGATIVE
Comment: NORMAL
Neisseria Gonorrhea: NEGATIVE
Trichomonas: POSITIVE — AB

## 2020-07-13 ENCOUNTER — Encounter (HOSPITAL_COMMUNITY): Payer: Self-pay

## 2020-07-13 ENCOUNTER — Ambulatory Visit (HOSPITAL_COMMUNITY)
Admission: EM | Admit: 2020-07-13 | Discharge: 2020-07-13 | Disposition: A | Discharge status: Home / Self Care | Payer: Medicaid Other | Attending: Internal Medicine | Admitting: Internal Medicine

## 2020-07-13 ENCOUNTER — Other Ambulatory Visit: Payer: Self-pay

## 2020-07-13 DIAGNOSIS — R300 Dysuria: Secondary | ICD-10-CM | POA: Diagnosis not present

## 2020-07-13 LAB — CERVICOVAGINAL ANCILLARY ONLY
Bacterial Vaginitis (gardnerella): POSITIVE — AB
Candida Glabrata: NEGATIVE
Candida Vaginitis: POSITIVE — AB
Chlamydia: NEGATIVE
Comment: NEGATIVE
Comment: NEGATIVE
Comment: NEGATIVE
Comment: NEGATIVE
Comment: NEGATIVE
Comment: NORMAL
Neisseria Gonorrhea: NEGATIVE
Trichomonas: NEGATIVE

## 2020-07-13 LAB — POCT URINALYSIS DIPSTICK, ED / UC
Bilirubin Urine: NEGATIVE
Glucose, UA: NEGATIVE mg/dL
Ketones, ur: NEGATIVE mg/dL
Nitrite: NEGATIVE
Protein, ur: NEGATIVE mg/dL
Specific Gravity, Urine: 1.02 (ref 1.005–1.030)
Urobilinogen, UA: 0.2 mg/dL (ref 0.0–1.0)
pH: 5.5 (ref 5.0–8.0)

## 2020-07-13 LAB — POC URINE PREG, ED: Preg Test, Ur: NEGATIVE

## 2020-07-13 NOTE — ED Triage Notes (Signed)
Pt presents with pain with urination and flank pain X 4 days.
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
## 2020-07-14 ENCOUNTER — Telehealth (HOSPITAL_COMMUNITY): Payer: Self-pay | Admitting: Emergency Medicine

## 2020-07-14 MED ORDER — FLUCONAZOLE 150 MG PO TABS: 150.0000 mg | ORAL_TABLET | Freq: Once | ORAL | 0 refills | Status: AC

## 2020-07-14 MED ORDER — METRONIDAZOLE 500 MG PO TABS: 500.0000 mg | ORAL_TABLET | Freq: Two times a day (BID) | ORAL | 0 refills | Status: DC

## 2020-07-15 ENCOUNTER — Telehealth (HOSPITAL_COMMUNITY): Payer: Self-pay | Admitting: Emergency Medicine

## 2020-07-15 LAB — URINE CULTURE: Culture: 60000 — AB

## 2020-07-15 MED ORDER — NITROFURANTOIN MONOHYD MACRO 100 MG PO CAPS: 100.0000 mg | ORAL_CAPSULE | Freq: Two times a day (BID) | ORAL | 0 refills | Status: DC

## 2020-07-18 ENCOUNTER — Emergency Department (HOSPITAL_COMMUNITY): Payer: Medicaid Other

## 2020-07-18 ENCOUNTER — Encounter (HOSPITAL_COMMUNITY): Payer: Self-pay | Admitting: Emergency Medicine

## 2020-07-18 ENCOUNTER — Emergency Department (HOSPITAL_COMMUNITY)
Admission: EM | Admit: 2020-07-18 | Discharge: 2020-07-18 | Disposition: A | Discharge status: Home / Self Care | Payer: Medicaid Other | Attending: Emergency Medicine | Admitting: Emergency Medicine

## 2020-07-18 ENCOUNTER — Other Ambulatory Visit: Payer: Self-pay

## 2020-07-18 DIAGNOSIS — T07XXXA Unspecified multiple injuries, initial encounter: Secondary | ICD-10-CM

## 2020-07-18 DIAGNOSIS — S81011A Laceration without foreign body, right knee, initial encounter: Secondary | ICD-10-CM | POA: Insufficient documentation

## 2020-07-18 DIAGNOSIS — S1193XA Puncture wound without foreign body of unspecified part of neck, initial encounter: Secondary | ICD-10-CM | POA: Diagnosis present

## 2020-07-18 DIAGNOSIS — S6000XA Contusion of unspecified finger without damage to nail, initial encounter: Secondary | ICD-10-CM | POA: Insufficient documentation

## 2020-07-18 DIAGNOSIS — Z23 Encounter for immunization: Secondary | ICD-10-CM | POA: Insufficient documentation

## 2020-07-18 DIAGNOSIS — Y92009 Unspecified place in unspecified non-institutional (private) residence as the place of occurrence of the external cause: Secondary | ICD-10-CM | POA: Diagnosis not present

## 2020-07-18 DIAGNOSIS — S61256A Open bite of right little finger without damage to nail, initial encounter: Secondary | ICD-10-CM | POA: Insufficient documentation

## 2020-07-18 DIAGNOSIS — S1181XA Laceration without foreign body of other specified part of neck, initial encounter: Secondary | ICD-10-CM | POA: Insufficient documentation

## 2020-07-18 DIAGNOSIS — Y9389 Activity, other specified: Secondary | ICD-10-CM | POA: Diagnosis not present

## 2020-07-18 DIAGNOSIS — S61254A Open bite of right ring finger without damage to nail, initial encounter: Secondary | ICD-10-CM | POA: Diagnosis not present

## 2020-07-18 DIAGNOSIS — S5012XA Contusion of left forearm, initial encounter: Secondary | ICD-10-CM | POA: Diagnosis not present

## 2020-07-18 DIAGNOSIS — Z87891 Personal history of nicotine dependence: Secondary | ICD-10-CM | POA: Insufficient documentation

## 2020-07-18 DIAGNOSIS — S71112A Laceration without foreign body, left thigh, initial encounter: Secondary | ICD-10-CM | POA: Insufficient documentation

## 2020-07-18 LAB — CBC WITH DIFFERENTIAL/PLATELET
Abs Immature Granulocytes: 0.08 10*3/uL — ABNORMAL HIGH (ref 0.00–0.07)
Basophils Absolute: 0 10*3/uL (ref 0.0–0.1)
Basophils Relative: 0 %
Eosinophils Absolute: 0 10*3/uL (ref 0.0–0.5)
Eosinophils Relative: 0 %
HCT: 42.8 % (ref 36.0–46.0)
Hemoglobin: 14 g/dL (ref 12.0–15.0)
Immature Granulocytes: 1 %
Lymphocytes Relative: 14 %
Lymphs Abs: 2.3 10*3/uL (ref 0.7–4.0)
MCH: 30.2 pg (ref 26.0–34.0)
MCHC: 32.7 g/dL (ref 30.0–36.0)
MCV: 92.4 fL (ref 80.0–100.0)
Monocytes Absolute: 1 10*3/uL (ref 0.1–1.0)
Monocytes Relative: 6 %
Neutro Abs: 12.5 10*3/uL — ABNORMAL HIGH (ref 1.7–7.7)
Neutrophils Relative %: 79 %
Platelets: 309 10*3/uL (ref 150–400)
RBC: 4.63 MIL/uL (ref 3.87–5.11)
RDW: 14.2 % (ref 11.5–15.5)
WBC: 15.9 10*3/uL — ABNORMAL HIGH (ref 4.0–10.5)
nRBC: 0 % (ref 0.0–0.2)

## 2020-07-18 LAB — I-STAT BETA HCG BLOOD, ED (MC, WL, AP ONLY): I-stat hCG, quantitative: <5 m[IU]/mL (ref <5)

## 2020-07-18 LAB — BASIC METABOLIC PANEL
Anion gap: 13 (ref 5–15)
BUN: 9 mg/dL (ref 6–20)
CO2: 20 mmol/L — ABNORMAL LOW (ref 22–32)
Calcium: 8.9 mg/dL (ref 8.9–10.3)
Chloride: 107 mmol/L (ref 98–111)
Creatinine, Ser: 0.81 mg/dL (ref 0.44–1.00)
GFR, Estimated: >60 mL/min (ref >60)
Glucose, Bld: 104 mg/dL — ABNORMAL HIGH (ref 70–99)
Potassium: 3.9 mmol/L (ref 3.5–5.1)
Sodium: 140 mmol/L (ref 135–145)

## 2020-07-18 MED ORDER — LIDOCAINE-EPINEPHRINE 1 %-1:100000 IJ SOLN
20.0000 mL | Freq: Once | INTRAMUSCULAR | Status: DC
  Filled 2020-07-18: qty 1

## 2020-07-18 MED ORDER — FENTANYL CITRATE (PF) 100 MCG/2ML IJ SOLN
100.0000 ug | Freq: Once | INTRAMUSCULAR | Status: AC
  Administered 2020-07-18: 100 ug via INTRAVENOUS
  Filled 2020-07-18: qty 2

## 2020-07-18 MED ORDER — IBUPROFEN 400 MG PO TABS
600.0000 mg | ORAL_TABLET | Freq: Once | ORAL | Status: AC
  Administered 2020-07-18: 600 mg via ORAL
  Filled 2020-07-18: qty 1

## 2020-07-18 MED ORDER — ACETAMINOPHEN 500 MG PO TABS
1000.0000 mg | ORAL_TABLET | Freq: Once | ORAL | Status: AC
  Administered 2020-07-18: 1000 mg via ORAL
  Filled 2020-07-18: qty 2

## 2020-07-18 MED ORDER — TETANUS-DIPHTH-ACELL PERTUSSIS 5-2.5-18.5 LF-MCG/0.5 IM SUSY
0.5000 mL | PREFILLED_SYRINGE | Freq: Once | INTRAMUSCULAR | Status: AC
  Administered 2020-07-18: 0.5 mL via INTRAMUSCULAR
  Filled 2020-07-18: qty 0.5

## 2020-07-18 MED ORDER — AMOXICILLIN-POT CLAVULANATE 875-125 MG PO TABS: 1.0000 | ORAL_TABLET | Freq: Two times a day (BID) | ORAL | 0 refills | Status: DC

## 2020-07-18 MED ORDER — MORPHINE SULFATE (PF) 4 MG/ML IV SOLN
4.0000 mg | Freq: Once | INTRAVENOUS | Status: AC
  Administered 2020-07-18: 4 mg via INTRAVENOUS
  Filled 2020-07-18: qty 1

## 2020-07-18 MED ORDER — ONDANSETRON HCL 4 MG/2ML IJ SOLN
4.0000 mg | Freq: Once | INTRAMUSCULAR | Status: AC
  Administered 2020-07-18: 4 mg via INTRAVENOUS
  Filled 2020-07-18: qty 2

## 2020-07-18 MED ORDER — IOHEXOL 350 MG/ML SOLN
75.0000 mL | Freq: Once | INTRAVENOUS | Status: AC | PRN
  Administered 2020-07-18: 75 mL via INTRAVENOUS
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
## 2020-07-18 NOTE — Discharge Instructions (Addendum)
Your lacerations was repaired with sutures today. Your tetanus shot was updated. Your sutures need to come out within 7-10 days. Go to urgent care for suture removal.   Take 1000 mg acetaminophen every 6 hours for pain. You can add 600 mg ibuprofen for more pain control.   Wear knee immobilizer and crutches at all times for the next 1 week.  Can be removed while laying down, bathing. Ice knees as much as possible. Do slightly bending/stretching movement as tolerated to prevent stiffness. If pain improving after 1 week, stop using immobilizer and start putting weight on knee as tolerated, use crutches as needed.   The original dressing should be left in place for 24 hours.  If your laceration is small enough, you can remove original dressing after 24 hours after which laceration can be opened to air. Laceration can then be gently cleaned with mild soap and water after 24 hours of laceration repair to prevent crusting over the suture knots. An antibiotic ointment can be applied to the wound as well, twice daily until suture removal. If your laceration is large or can be contaminated you can cover it with a dressing throughout the day.   You may shower or wash the wound with soap and water. Avoid prolonged soaking of stitches including swimming in chlorinated water, pools, hot tubs. Do not swim or soak in natural bodies of water because of a potential increased risk of infection.   Return for swelling, pain, redness, pus, fevers.
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
## 2020-07-27 ENCOUNTER — Other Ambulatory Visit: Payer: Self-pay

## 2020-07-27 ENCOUNTER — Ambulatory Visit
Admission: EM | Admit: 2020-07-27 | Discharge: 2020-07-27 | Disposition: A | Discharge status: Home / Self Care | Payer: Medicaid Other
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
## 2020-09-23 ENCOUNTER — Ambulatory Visit (HOSPITAL_COMMUNITY)
Admission: EM | Admit: 2020-09-23 | Discharge: 2020-09-23 | Disposition: A | Discharge status: Home / Self Care | Payer: Medicaid Other | Attending: Urgent Care | Admitting: Urgent Care

## 2020-09-23 ENCOUNTER — Other Ambulatory Visit: Payer: Self-pay

## 2020-09-23 ENCOUNTER — Encounter (HOSPITAL_COMMUNITY): Payer: Self-pay | Admitting: *Deleted

## 2020-09-23 DIAGNOSIS — N760 Acute vaginitis: Secondary | ICD-10-CM | POA: Insufficient documentation

## 2020-09-23 DIAGNOSIS — N912 Amenorrhea, unspecified: Secondary | ICD-10-CM | POA: Insufficient documentation

## 2020-09-23 DIAGNOSIS — N3001 Acute cystitis with hematuria: Secondary | ICD-10-CM | POA: Insufficient documentation

## 2020-09-23 LAB — POCT URINALYSIS DIPSTICK, ED / UC
Bilirubin Urine: NEGATIVE
Glucose, UA: NEGATIVE mg/dL
Ketones, ur: NEGATIVE mg/dL
Nitrite: NEGATIVE
Protein, ur: 30 mg/dL — AB
Specific Gravity, Urine: 1.025 (ref 1.005–1.030)
Urobilinogen, UA: 0.2 mg/dL (ref 0.0–1.0)
pH: 6.5 (ref 5.0–8.0)

## 2020-09-23 LAB — POC URINE PREG, ED: Preg Test, Ur: NEGATIVE

## 2020-09-23 MED ORDER — FLUCONAZOLE 150 MG PO TABS: 150.0000 mg | ORAL_TABLET | ORAL | 0 refills | Status: DC

## 2020-09-23 MED ORDER — CEPHALEXIN 500 MG PO CAPS: 500.0000 mg | ORAL_CAPSULE | Freq: Two times a day (BID) | ORAL | 0 refills | Status: DC

## 2020-09-23 NOTE — ED Triage Notes (Signed)
PT presents today requesting tests for STD, Pregnancy and UTI. Pt reports lower back pain,dysuris.

## 2020-09-23 NOTE — Discharge Instructions (Signed)
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
## 2020-09-24 ENCOUNTER — Telehealth (HOSPITAL_COMMUNITY): Payer: Self-pay | Admitting: Emergency Medicine

## 2020-09-24 LAB — CERVICOVAGINAL ANCILLARY ONLY
Bacterial Vaginitis (gardnerella): POSITIVE — AB
Candida Glabrata: NEGATIVE
Candida Vaginitis: NEGATIVE
Chlamydia: NEGATIVE
Comment: NEGATIVE
Comment: NEGATIVE
Comment: NEGATIVE
Comment: NEGATIVE
Comment: NEGATIVE
Comment: NORMAL
Neisseria Gonorrhea: NEGATIVE
Trichomonas: NEGATIVE

## 2020-09-24 MED ORDER — METRONIDAZOLE 500 MG PO TABS: 500.0000 mg | ORAL_TABLET | Freq: Two times a day (BID) | ORAL | 0 refills | Status: DC

## 2020-11-18 ENCOUNTER — Other Ambulatory Visit: Payer: Self-pay

## 2020-11-18 ENCOUNTER — Inpatient Hospital Stay (HOSPITAL_COMMUNITY): Payer: Medicaid Other

## 2020-11-18 ENCOUNTER — Encounter (HOSPITAL_COMMUNITY): Payer: Self-pay | Admitting: Obstetrics & Gynecology

## 2020-11-18 ENCOUNTER — Inpatient Hospital Stay (HOSPITAL_COMMUNITY)
Admission: AD | Admit: 2020-11-18 | Discharge: 2020-11-18 | Disposition: A | Discharge status: Home / Self Care | Payer: Medicaid Other | Attending: Obstetrics & Gynecology | Admitting: Obstetrics & Gynecology

## 2020-11-18 DIAGNOSIS — N8311 Corpus luteum cyst of right ovary: Secondary | ICD-10-CM | POA: Insufficient documentation

## 2020-11-18 DIAGNOSIS — R109 Unspecified abdominal pain: Secondary | ICD-10-CM | POA: Diagnosis not present

## 2020-11-18 DIAGNOSIS — R1031 Right lower quadrant pain: Secondary | ICD-10-CM | POA: Diagnosis not present

## 2020-11-18 DIAGNOSIS — O26891 Other specified pregnancy related conditions, first trimester: Secondary | ICD-10-CM | POA: Diagnosis not present

## 2020-11-18 DIAGNOSIS — Z87891 Personal history of nicotine dependence: Secondary | ICD-10-CM | POA: Diagnosis not present

## 2020-11-18 DIAGNOSIS — O209 Hemorrhage in early pregnancy, unspecified: Secondary | ICD-10-CM | POA: Insufficient documentation

## 2020-11-18 DIAGNOSIS — O3680X0 Pregnancy with inconclusive fetal viability, not applicable or unspecified: Secondary | ICD-10-CM

## 2020-11-18 DIAGNOSIS — Z3A01 Less than 8 weeks gestation of pregnancy: Secondary | ICD-10-CM | POA: Diagnosis not present

## 2020-11-18 DIAGNOSIS — O3481 Maternal care for other abnormalities of pelvic organs, first trimester: Secondary | ICD-10-CM | POA: Diagnosis not present

## 2020-11-18 DIAGNOSIS — Z3A00 Weeks of gestation of pregnancy not specified: Secondary | ICD-10-CM

## 2020-11-18 LAB — WET PREP, GENITAL
Clue Cells Wet Prep HPF POC: NONE SEEN
Sperm: NONE SEEN
Trich, Wet Prep: NONE SEEN
Yeast Wet Prep HPF POC: NONE SEEN

## 2020-11-18 LAB — CBC
HCT: 40.7 % (ref 36.0–46.0)
Hemoglobin: 13.3 g/dL (ref 12.0–15.0)
MCH: 30.5 pg (ref 26.0–34.0)
MCHC: 32.7 g/dL (ref 30.0–36.0)
MCV: 93.3 fL (ref 80.0–100.0)
Platelets: 302 10*3/uL (ref 150–400)
RBC: 4.36 MIL/uL (ref 3.87–5.11)
RDW: 13.6 % (ref 11.5–15.5)
WBC: 9 10*3/uL (ref 4.0–10.5)
nRBC: 0 % (ref 0.0–0.2)

## 2020-11-18 LAB — URINALYSIS, ROUTINE W REFLEX MICROSCOPIC
Bilirubin Urine: NEGATIVE
Glucose, UA: NEGATIVE mg/dL
Hgb urine dipstick: NEGATIVE
Ketones, ur: NEGATIVE mg/dL
Leukocytes,Ua: NEGATIVE
Nitrite: NEGATIVE
Protein, ur: NEGATIVE mg/dL
Specific Gravity, Urine: 1.019 (ref 1.005–1.030)
pH: 6 (ref 5.0–8.0)

## 2020-11-18 LAB — HCG, QUANTITATIVE, PREGNANCY: hCG, Beta Chain, Quant, S: 1192 m[IU]/mL — ABNORMAL HIGH (ref <5)

## 2020-11-18 NOTE — MAU Note (Signed)
Presents with c/o intermittent right sided abdominal pain and spotting x2 days.  LMP 10/04/2020.   Pt has Pregnancy Verification Form from [facility].

## 2020-11-18 NOTE — Discharge Instructions (Signed)
Return to care  If you have heavier bleeding that soaks through more that 2 pads per hour for an hour or more If you bleed so much that you feel like you might pass out or you do pass out If you have significant abdominal pain that is not improved with Tylenol   

## 2020-11-18 NOTE — MAU Provider Note (Signed)
History    Patient Name: Erin Benjamin CSN: [id number]  Arrival date and time: 11/18/20 0355   Event Date/Time   First Provider Initiated Contact with Patient 11/18/20 1034     Chief Complaint  Patient presents with  . Abdominal Pain   HPI  Patient is a 29yof G3P2 [redacted]w[redacted]d by LMP complaining of abdominal pain and spotting. She states that has been having right sided lower abdominal pain for the past week. She describes it as sharp, waxing and waning and rates the pain as 6/10. She mentions right lower back pain associated with the lower abdominal pain. She denies injury to her back. She has had spotting for the past two days. She notes bright red blood when she wipes and a small amount of blood in her underwear, not enough for a panty liner. She has not had any spotting this morning. She notes one episode of vomiting last night after she ate dinner. Denies nausea, fever, chest pain, dysuria, hematuria and vaginal discharge.   OB History    Gravida  3   Para  2   Term  2   Preterm  0   AB  0   Living  2     SAB      IAB      Ectopic      Multiple  0   Live Births  2           Past Medical History:  Diagnosis Date  . Medical history non-contributory     Past Surgical History:  Procedure Laterality Date  . CESAREAN SECTION  08/2014  . CESAREAN SECTION N/A 11/18/2017   Procedure: CESAREAN SECTION;  Surgeon: Hermina Staggers, MD;  Location: [facility] BIRTHING SUITES;  Service: Obstetrics;  Laterality: N/A;  . CHOLECYSTECTOMY N/A 07/30/2018   Procedure: LAPAROSCOPIC CHOLECYSTECTOMY WITH INTRAOPERATIVE CHOLANGIOGRAM;  Surgeon: Harriette Bouillon, MD;  Location: [facility] OR;  Service: General;  Laterality: N/A;    Family History  Problem Relation Age of Onset  . Diabetes Mother     Social History   Tobacco Use  . Smoking status: Former Smoker    Packs/day: 1.00    Types: Cigarettes    Quit date: 02/20/2017    Years since quitting: 3.7  . Smokeless tobacco: Never Used  .  Tobacco comment: stopped with pregnancy  Vaping Use  . Vaping Use: Former  Substance Use Topics  . Alcohol use: Not Currently    Comment:    . Drug use: Not Currently    Types: Marijuana    Comment: stopped 02/2017    Allergies: No Known Allergies  Medications Prior to Admission  Medication Sig Dispense Refill Last Dose  . cephALEXin (KEFLEX) 500 MG capsule Take 1 capsule (500 mg total) by mouth 2 (two) times daily. 10 capsule 0   . fluconazole (DIFLUCAN) 150 MG tablet Take 1 tablet (150 mg total) by mouth once a week. 2 tablet 0   . metroNIDAZOLE (FLAGYL) 500 MG tablet Take 1 tablet (500 mg total) by mouth 2 (two) times daily. 14 tablet 0     Review of Systems  Constitutional: Negative for fever.  Cardiovascular: Negative for chest pain.  Gastrointestinal: Positive for abdominal pain and vomiting. Negative for nausea.  Genitourinary: Positive for frequency and vaginal bleeding. Negative for difficulty urinating, dysuria, urgency and vaginal discharge.   Physical Exam   Blood pressure 129/89, pulse 66, temperature 98.2 F (36.8 C), temperature source Oral, resp. rate 20, height 5\' 8"  (1.727 m),  weight 93.2 kg, last menstrual period 10/04/2020, SpO2 99 %.  Physical Exam Vitals and nursing note reviewed.  HENT:     Head: Normocephalic.  Cardiovascular:     Rate and Rhythm: Normal rate and regular rhythm.     Heart sounds: Normal heart sounds.  Pulmonary:     Effort: Pulmonary effort is normal.     Breath sounds: Normal breath sounds.  Abdominal:     Tenderness: There is abdominal tenderness in the right lower quadrant. There is no rebound.  Neurological:     General: No focal deficit present.     Mental Status: She is alert.  Psychiatric:        Mood and Affect: Mood normal.        Behavior: Behavior normal.     MAU Course  Procedures  US OB LESS THAN 14 WEEKS WITH OB TRANSVAGINAL  Result Date: 11/18/2020 CLINICAL DATA:  Vaginal bleeding and right lower quadrant  pain for 2 days. Positive pregnancy test. EXAM: OBSTETRIC <14 WK [place] AND TRANSVAGINAL OB US TECHNIQUE: Both transabdominal and transvaginal ultrasound examinations were performed for complete evaluation of the gestation as well as the maternal uterus, adnexal regions, and pelvic cul-de-sac. Transvaginal technique was performed to assess early pregnancy. COMPARISON:  None. FINDINGS: Intrauterine gestational sac: None Yolk sac:  N/A Embryo:  N/A Cardiac Activity: N/A Heart Rate: N/A bpm Subchorionic hemorrhage:  N/A Maternal uterus/adnexae: Moderate endometrial thickening is noted. Both ovaries are normal. Small right-sided corpus luteum cyst. No adnexal mass. No free pelvic fluid collections. IMPRESSION: 1. No intrauterine gestational sac is identified. Moderate endometrial thickening is noted. Recommend correlation with beta HCG levels. 2. Normal ovaries. No adnexal mass or free pelvic fluid collections. Electronically Signed   By: Rudie Meyer M.D.   On: 11/18/2020 12:58   Results for orders placed or performed during the hospital encounter of 11/18/20 (from the past 72 hour(s))  Urinalysis, Routine w reflex microscopic Urine, Clean Catch     Status: None   Collection Time: 11/18/20 10:03 AM  Result Value Ref Range   Color, Urine YELLOW YELLOW   APPearance CLEAR CLEAR   Specific Gravity, Urine 1.019 1.005 - 1.030   pH 6.0 5.0 - 8.0   Glucose, UA NEGATIVE NEGATIVE mg/dL   Hgb urine dipstick NEGATIVE NEGATIVE   Bilirubin Urine NEGATIVE NEGATIVE   Ketones, ur NEGATIVE NEGATIVE mg/dL   Protein, ur NEGATIVE NEGATIVE mg/dL   Nitrite NEGATIVE NEGATIVE   Leukocytes,Ua NEGATIVE NEGATIVE    Comment: Performed at [facility], [address], [place], [place] [address]  Wet prep, genital     Status: Abnormal   Collection Time: 11/18/20 11:39 AM  Result Value Ref Range   Yeast Wet Prep HPF POC NONE SEEN NONE SEEN   Trich, Wet Prep NONE SEEN NONE SEEN   Clue Cells Wet Prep HPF POC NONE SEEN NONE  SEEN   WBC, Wet Prep HPF POC FEW (A) NONE SEEN   Sperm NONE SEEN     Comment: Performed at [facility], [address], [place], [place] [address]  CBC     Status: None   Collection Time: 11/18/20 12:01 PM  Result Value Ref Range   WBC 9.0 4.0 - 10.5 K/uL   RBC 4.36 3.87 - 5.11 MIL/uL   Hemoglobin 13.3 12.0 - 15.0 g/dL   HCT 34.1 96.2 - 22.9 %   MCV 93.3 80.0 - 100.0 fL   MCH 30.5 26.0 - 34.0 pg  MCHC 32.7 30.0 - 36.0 g/dL   RDW 56.1 53.7 - 94.3 %   Platelets 302 150 - 400 K/uL   nRBC 0.0 0.0 - 0.2 %    Comment: Performed at [facility], [address], [place], [place] [address]  hCG, quantitative, pregnancy     Status: Abnormal   Collection Time: 11/18/20 12:01 PM  Result Value Ref Range   hCG, Beta Chain, Quant, S 1,192 (H) <5 mIU/mL    Comment:          GEST. AGE      CONC.  (mIU/mL)   <=1 WEEK        5 - 50     2 WEEKS       50 - 500     3 WEEKS       100 - 10,000     4 WEEKS     1,000 - 30,000     5 WEEKS     3,500 - 115,000   6-8 WEEKS     12,000 - 270,000    12 WEEKS     15,000 - 220,000        FEMALE AND NON-PREGNANT FEMALE:     LESS THAN 5 mIU/mL Performed at [facility], [address] [place], [place] [address]     MDM Patient is a 37yof G3P2 [redacted]w[redacted]d by LMP complaining of abdominal pain and spotting. She is sitting up comfortably in bed with no acute complaints of pain at bedside. [place], GC and wet prep ordered. No rebound tenderness of RLQ on exam with unremarkable CBC and no fever, less likely to be acute appendicitis. U/S showed no gestational sac. Beta quant is elevated. Pregnancy of unknown location determined. Cannot rule out ectopic, IUP or SAB. Will need to trend beta quant.   Assessment and Plan   Pregnancy of unknown location  -- Beta quant elevated  -- CBC unremarkable  -- Wet prep negative  -- U/S: no gestational sac  -- F/u with beta quant in 2 days to trend to r/o ectopic pregnancy  -- Explained the importance of returning to  get beta quant measurement to r/o ectopic  -- Strict ectopic and SAB return precautions given  Right-sided corpus luteum cyst  -- Found on u/s  -- May be the cause of her right sided lower abdominal pain  -- Reassured pt that it is not concerning   Jeoffrey Massed, PA-Student 11/18/2020, 10:57 AM

## 2020-11-18 NOTE — MAU Provider Note (Signed)
History     CSN: [id number]  Arrival date and time: 11/18/20 0948   Event Date/Time   First Provider Initiated Contact with Patient 11/18/20 1034      Chief Complaint  Patient presents with  . Abdominal Pain   HPI Erin Benjamin is a 30 y.o. G3P2002 at [redacted]w[redacted]d who presents with abdominal pain. Symptoms started 2 days ago. Reports intermittent pain in right lower quadrant of her abdomen. Rates pain 7/10. Hasn't treated symptoms. Nothing makes better or worse. Denies associated symptoms. Reports some pink spotting the last few days. Not bleeding into pad or passing clots. No bleeding today. Vomited once yesterday. Denies nausea, dysuria, fever, abnormal discharge.    OB History    Gravida  3   Para  2   Term  2   Preterm  0   AB  0   Living  2     SAB      IAB      Ectopic      Multiple  0   Live Births  2           Past Medical History:  Diagnosis Date  . Medical history non-contributory     Past Surgical History:  Procedure Laterality Date  . CESAREAN SECTION  08/2014  . CESAREAN SECTION N/A 11/18/2017   Procedure: CESAREAN SECTION;  Surgeon: Hermina Staggers, MD;  Location: [facility] BIRTHING SUITES;  Service: Obstetrics;  Laterality: N/A;  . CHOLECYSTECTOMY N/A 07/30/2018   Procedure: LAPAROSCOPIC CHOLECYSTECTOMY WITH INTRAOPERATIVE CHOLANGIOGRAM;  Surgeon: Harriette Bouillon, MD;  Location: [facility] OR;  Service: General;  Laterality: N/A;    Family History  Problem Relation Age of Onset  . Diabetes Mother     Social History   Tobacco Use  . Smoking status: Former Smoker    Packs/day: 1.00    Types: Cigarettes    Quit date: 02/20/2017    Years since quitting: 3.7  . Smokeless tobacco: Never Used  . Tobacco comment: stopped with pregnancy  Vaping Use  . Vaping Use: Former  Substance Use Topics  . Alcohol use: Not Currently    Comment:    . Drug use: Not Currently    Types: Marijuana    Comment: stopped 02/2017    Allergies: No Known Allergies  No  medications prior to admission.    Review of Systems  Constitutional: Negative.   Gastrointestinal: Positive for abdominal pain and vomiting. Negative for diarrhea and nausea.  Genitourinary: Positive for vaginal bleeding. Negative for dysuria, flank pain, hematuria and vaginal discharge.   Physical Exam   Blood pressure 124/74, pulse 63, temperature 98.2 F (36.8 C), temperature source Oral, resp. rate 20, height 5\' 8"  (1.727 m), weight 93.2 kg, last menstrual period 10/04/2020, SpO2 99 %.  Physical Exam Vitals and nursing note reviewed.  Constitutional:      General: She is not in acute distress.    Appearance: She is well-developed.  HENT:     Head: Normocephalic and atraumatic.  Pulmonary:     Effort: Pulmonary effort is normal. No respiratory distress.  Abdominal:     General: Abdomen is flat.     Palpations: Abdomen is soft.     Tenderness: There is abdominal tenderness in the right lower quadrant. There is no guarding or rebound.  Skin:    General: Skin is warm and dry.  Neurological:     Mental Status: She is alert.  Psychiatric:        Mood and Affect: Mood  normal.        Behavior: Behavior normal.     MAU Course  Procedures Results for orders placed or performed during the hospital encounter of 11/18/20 (from the past 24 hour(s))  Urinalysis, Routine w reflex microscopic Urine, Clean Catch     Status: None   Collection Time: 11/18/20 10:03 AM  Result Value Ref Range   Color, Urine YELLOW YELLOW   APPearance CLEAR CLEAR   Specific Gravity, Urine 1.019 1.005 - 1.030   pH 6.0 5.0 - 8.0   Glucose, UA NEGATIVE NEGATIVE mg/dL   Hgb urine dipstick NEGATIVE NEGATIVE   Bilirubin Urine NEGATIVE NEGATIVE   Ketones, ur NEGATIVE NEGATIVE mg/dL   Protein, ur NEGATIVE NEGATIVE mg/dL   Nitrite NEGATIVE NEGATIVE   Leukocytes,Ua NEGATIVE NEGATIVE  Wet prep, genital     Status: Abnormal   Collection Time: 11/18/20 11:39 AM  Result Value Ref Range   Yeast Wet Prep HPF  POC NONE SEEN NONE SEEN   Trich, Wet Prep NONE SEEN NONE SEEN   Clue Cells Wet Prep HPF POC NONE SEEN NONE SEEN   WBC, Wet Prep HPF POC FEW (A) NONE SEEN   Sperm NONE SEEN   CBC     Status: None   Collection Time: 11/18/20 12:01 PM  Result Value Ref Range   WBC 9.0 4.0 - 10.5 K/uL   RBC 4.36 3.87 - 5.11 MIL/uL   Hemoglobin 13.3 12.0 - 15.0 g/dL   HCT 01.7 49.4 - 49.6 %   MCV 93.3 80.0 - 100.0 fL   MCH 30.5 26.0 - 34.0 pg   MCHC 32.7 30.0 - 36.0 g/dL   RDW 75.9 16.3 - 84.6 %   Platelets 302 150 - 400 K/uL   nRBC 0.0 0.0 - 0.2 %  hCG, quantitative, pregnancy     Status: Abnormal   Collection Time: 11/18/20 12:01 PM  Result Value Ref Range   hCG, Beta Chain, Quant, S 1,192 (H) <5 mIU/mL   US OB LESS THAN 14 WEEKS WITH OB TRANSVAGINAL  Result Date: 11/18/2020 CLINICAL DATA:  Vaginal bleeding and right lower quadrant pain for 2 days. Positive pregnancy test. EXAM: OBSTETRIC <14 WK [place] AND TRANSVAGINAL OB US TECHNIQUE: Both transabdominal and transvaginal ultrasound examinations were performed for complete evaluation of the gestation as well as the maternal uterus, adnexal regions, and pelvic cul-de-sac. Transvaginal technique was performed to assess early pregnancy. COMPARISON:  None. FINDINGS: Intrauterine gestational sac: None Yolk sac:  N/A Embryo:  N/A Cardiac Activity: N/A Heart Rate: N/A bpm Subchorionic hemorrhage:  N/A Maternal uterus/adnexae: Moderate endometrial thickening is noted. Both ovaries are normal. Small right-sided corpus luteum cyst. No adnexal mass. No free pelvic fluid collections. IMPRESSION: 1. No intrauterine gestational sac is identified. Moderate endometrial thickening is noted. Recommend correlation with beta HCG levels. 2. Normal ovaries. No adnexal mass or free pelvic fluid collections. Electronically Signed   By: Rudie Meyer M.D.   On: 11/18/2020 12:58    MDM +UPT UA, wet prep, GC/chlamydia, CBC, ABO/Rh, quant hCG, and [place] today to rule out ectopic pregnancy  which can be life threatening.   RH positive  Ultrasound shows no IUP or adnexal mass. HCG today is 1192.  This abdominal pain could represent a normal pregnancy, spontaneous abortion, or even an ectopic pregnancy which can be life-threatening. Cultures were obtained to rule out pelvic infection.  Will schedule for repeat HCG  Assessment and Plan   1. Pregnancy of unknown anatomic location   2. Abdominal  pain during pregnancy in first trimester    -scheduled for stat HCG on Friday at [facility] -reviewed miscarriage vs ectopic pregnancy -GC/CT pending  Judeth Horn 11/18/2020, 4:46 PM

## 2020-11-19 LAB — GC/CHLAMYDIA PROBE AMP (~~LOC~~) NOT AT ARMC
Chlamydia: NEGATIVE
Comment: NEGATIVE
Comment: NORMAL
Neisseria Gonorrhea: NEGATIVE

## 2020-11-20 ENCOUNTER — Other Ambulatory Visit: Payer: Self-pay

## 2020-11-20 ENCOUNTER — Ambulatory Visit (INDEPENDENT_AMBULATORY_CARE_PROVIDER_SITE_OTHER): Payer: Medicaid Other | Admitting: *Deleted

## 2020-11-20 VITALS — BP 117/82 | HR 59 | Ht 68.0 in | Wt 207.9 lb

## 2020-11-20 DIAGNOSIS — O3680X0 Pregnancy with inconclusive fetal viability, not applicable or unspecified: Secondary | ICD-10-CM

## 2020-11-20 LAB — BETA HCG QUANT (REF LAB): hCG Quant: 2020 m[IU]/mL

## 2020-11-20 NOTE — Progress Notes (Signed)
Pt reports intermittent low back pain which is not a new finding. She has a job which requires much physical activity and periodically gets back pain outside of pregnancy. She denies abdominal pain and vaginal bleeding. Pt has documented food insecurity on her questionnaire however denies need for food today - Food market information provided. Pt was advised that she will be called with test results later today. She stated that a detailed message can be left on voicemail if she does not answer.  1150  BHCG results reviewed by Dr. Crissie Reese who finds appropriate rise in hormone level. Pt will need [place] for viability in 10/14 days. This was scheduled on 4/27 @ 0900.  I called pt and left detailed voicemail message stating her results show an increase in hormone level which is consistent with a progressing pregnancy. She will need [place] to check the progress - date and time stated. Meanwhile, she should return to [facility] if she develops heavy vaginal bleeding, abdominal pain or severe increase in her back pain. Pt may call back or send Mychart message if she has questions.

## 2020-11-21 NOTE — Progress Notes (Signed)
Chart reviewed for nurse visit. Agree with plan of care.   Venora Maples, MD 11/21/20 3:28 PM

## 2020-12-01 ENCOUNTER — Encounter: Payer: Self-pay | Admitting: General Practice

## 2020-12-01 ENCOUNTER — Telehealth: Payer: Self-pay | Admitting: General Practice

## 2020-12-01 NOTE — Telephone Encounter (Signed)
Patient called into office stating she is currently at the dentist office and needs a basic letter so they can treat her since she is pregnant. Patient is requesting letter be emailed. Told patient I can create the letter and it will pop up in her mychart account. Patient verbalized understanding & had no questions.

## 2020-12-09 ENCOUNTER — Telehealth: Payer: Self-pay | Admitting: Advanced Practice Midwife

## 2020-12-09 ENCOUNTER — Ambulatory Visit
Admission: RE | Admit: 2020-12-09 | Discharge: 2020-12-09 | Disposition: A | Discharge status: Home / Self Care | Payer: Medicaid Other | Source: Ambulatory Visit | Attending: Family Medicine | Admitting: Family Medicine

## 2020-12-09 ENCOUNTER — Other Ambulatory Visit: Payer: Self-pay

## 2020-12-09 DIAGNOSIS — O3680X0 Pregnancy with inconclusive fetal viability, not applicable or unspecified: Secondary | ICD-10-CM | POA: Insufficient documentation

## 2020-12-09 NOTE — Telephone Encounter (Signed)
Pt returned call, 2 identifiers given to verify.  Reviewed normal viability [place] today with EDD c/w LMP dates (5 days different from [place]).  Pt to call [facility] as planned to make appt for prenatal care. Pt to return to MAU as needed for emergencies.

## 2020-12-09 NOTE — Telephone Encounter (Signed)
Called pt to review her [place] results from 12/09/20 and left message for pt return call regarding results.

## 2020-12-17 ENCOUNTER — Telehealth (INDEPENDENT_AMBULATORY_CARE_PROVIDER_SITE_OTHER): Payer: Medicaid Other

## 2020-12-17 DIAGNOSIS — O0990 Supervision of high risk pregnancy, unspecified, unspecified trimester: Secondary | ICD-10-CM

## 2020-12-17 NOTE — Progress Notes (Signed)
Called pt and pt informed me that she has an appt for her son at 52 and will not be able to do the intake appt.  I asked pt if she would be available at 1015.  Pt stated that she works second shift and will need to sleep.  I advised pt that someone from the front office will call and reschedule her NEW OB intake appt.  Pt apologized and verbalized understanding.   Addison Naegeli, RN  12/17/20

## 2020-12-30 ENCOUNTER — Encounter: Payer: Medicaid Other | Admitting: Obstetrics & Gynecology

## 2021-01-13 ENCOUNTER — Telehealth: Payer: Self-pay

## 2021-01-13 NOTE — Telephone Encounter (Signed)
Patient called in wanting to know if provider would send in her a Rx for prenatal vitiaims prior to her being seen on 02/04/21. When she was confirmed by the health department they sent her some in but it was only for 30 days    Prenatal Vit-Fe Fumarate-FA (MULTIVITAMIN-PRENATAL) 27-0.8 MG TABS tablet

## 2021-02-04 ENCOUNTER — Encounter: Payer: Medicaid Other | Admitting: Obstetrics & Gynecology

## 2021-02-10 ENCOUNTER — Ambulatory Visit (INDEPENDENT_AMBULATORY_CARE_PROVIDER_SITE_OTHER): Payer: Medicaid Other | Admitting: Certified Nurse Midwife

## 2021-02-10 ENCOUNTER — Other Ambulatory Visit (HOSPITAL_COMMUNITY)
Admission: RE | Admit: 2021-02-10 | Discharge: 2021-02-10 | Disposition: A | Discharge status: Home / Self Care | Payer: Medicaid Other | Source: Ambulatory Visit | Attending: Obstetrics & Gynecology | Admitting: Obstetrics & Gynecology

## 2021-02-10 ENCOUNTER — Encounter: Payer: Self-pay | Admitting: Certified Nurse Midwife

## 2021-02-10 ENCOUNTER — Other Ambulatory Visit: Payer: Self-pay

## 2021-02-10 VITALS — BP 102/67 | HR 85 | Wt 206.4 lb

## 2021-02-10 DIAGNOSIS — Z3A17 17 weeks gestation of pregnancy: Secondary | ICD-10-CM | POA: Diagnosis not present

## 2021-02-10 DIAGNOSIS — Z3492 Encounter for supervision of normal pregnancy, unspecified, second trimester: Secondary | ICD-10-CM | POA: Diagnosis not present

## 2021-02-10 DIAGNOSIS — N949 Unspecified condition associated with female genital organs and menstrual cycle: Secondary | ICD-10-CM

## 2021-02-10 DIAGNOSIS — O0990 Supervision of high risk pregnancy, unspecified, unspecified trimester: Secondary | ICD-10-CM | POA: Insufficient documentation

## 2021-02-10 DIAGNOSIS — O2342 Unspecified infection of urinary tract in pregnancy, second trimester: Secondary | ICD-10-CM

## 2021-02-10 DIAGNOSIS — Z5941 Food insecurity: Secondary | ICD-10-CM | POA: Diagnosis not present

## 2021-02-10 MED ORDER — WESTAB PLUS 27-1 MG PO TABS: 1.0000 | ORAL_TABLET | Freq: Every day | ORAL | 3 refills | Status: DC

## 2021-02-10 NOTE — Progress Notes (Signed)
Patient stated that she been having "period like" cramps in pelvis and occasional headaches. Patient stated that she believe she is feeling movement from baby

## 2021-02-11 ENCOUNTER — Encounter: Payer: Self-pay | Admitting: *Deleted

## 2021-02-12 LAB — AFP, SERUM, OPEN SPINA BIFIDA
AFP MoM: 0.81
AFP Value: 28.1 ng/mL
Gest. Age on Collection Date: 17.4 weeks
Maternal Age At EDD: 29.5 yr
OSBR Risk 1 IN: 10000
Test Results:: NEGATIVE
Weight: 206 [lb_av]

## 2021-02-12 LAB — CYTOLOGY - PAP: Diagnosis: NEGATIVE

## 2021-02-12 LAB — HCV INTERPRETATION

## 2021-02-12 LAB — CBC/D/PLT+RPR+RH+ABO+RUBIGG...
Antibody Screen: NEGATIVE
Basophils Absolute: 0.1 10*3/uL (ref 0.0–0.2)
Basos: 1 %
EOS (ABSOLUTE): 0.2 10*3/uL (ref 0.0–0.4)
Eos: 2 %
HCV Ab: <0.1 s/co ratio (ref 0.0–0.9)
HIV Screen 4th Generation wRfx: NONREACTIVE
Hematocrit: 37.9 % (ref 34.0–46.6)
Hemoglobin: 12.7 g/dL (ref 11.1–15.9)
Hepatitis B Surface Ag: NEGATIVE
Immature Grans (Abs): 0.1 10*3/uL (ref 0.0–0.1)
Immature Granulocytes: 1 %
Lymphocytes Absolute: 2 10*3/uL (ref 0.7–3.1)
Lymphs: 19 %
MCH: 30.5 pg (ref 26.6–33.0)
MCHC: 33.5 g/dL (ref 31.5–35.7)
MCV: 91 fL (ref 79–97)
Monocytes Absolute: 0.6 10*3/uL (ref 0.1–0.9)
Monocytes: 6 %
Neutrophils Absolute: 7.3 10*3/uL — ABNORMAL HIGH (ref 1.4–7.0)
Neutrophils: 71 %
Platelets: 226 10*3/uL (ref 150–450)
RBC: 4.16 x10E6/uL (ref 3.77–5.28)
RDW: 12.7 % (ref 11.7–15.4)
RPR Ser Ql: NONREACTIVE
Rh Factor: POSITIVE
Rubella Antibodies, IGG: 1.68 index (ref >0.99)
WBC: 10.2 10*3/uL (ref 3.4–10.8)

## 2021-02-12 LAB — HEMOGLOBIN A1C
Est. average glucose Bld gHb Est-mCnc: 100 mg/dL
Hgb A1c MFr Bld: 5.1 % (ref 4.8–5.6)

## 2021-02-12 LAB — GC/CHLAMYDIA PROBE AMP (~~LOC~~) NOT AT ARMC
Chlamydia: NEGATIVE
Comment: NEGATIVE
Comment: NORMAL
Neisseria Gonorrhea: NEGATIVE

## 2021-02-12 NOTE — Progress Notes (Signed)
History:   Erin Benjamin is a 30 y.o. G3P2002 at [redacted]w[redacted]d by LMP being seen today for her first obstetrical visit.  Her obstetrical history is significant for  CSx2 . Patient does intend to breast feed. Pregnancy history fully reviewed.  Patient reports  pelvic pain when moving/walking/etc but no abnormal discharge, vaginal bleeding or excessive vaginal pressure .      HISTORY: OB History  Gravida Para Term Preterm AB Living  3 2 2  0 0 2  SAB IAB Ectopic Multiple Live Births  0 0 0 0 2    # Outcome Date GA Lbr Len/2nd Weight Sex Delivery Anes PTL Lv  3 Current           2 Term 11/18/17 [redacted]w[redacted]d  8 lb 1.8 oz (3.68 kg) F CS-LTranv EPI  LIV     Name: Erin Benjamin     Apgar1: 8  Apgar5: 9  1 Term 08/18/14 [redacted]w[redacted]d  8 lb 6 oz (3.799 kg) M CS-Unspec EPI  LIV     Birth Comments: no complications except c/s FTP    Last pap smear was done 2021 and was normal  Past Medical History:  Diagnosis Date   Bacterial vaginosis 06/15/2017   Found on NOB pap; not treated.  Pt has no s/s on 11-1. Consider repeat wet prep at [facility].    Cholelithiasis with acute cholecystitis 07/30/2018   Diverticulitis of colon with perforation 11/10/2019   Medical history non-contributory    Post-operative state 11/18/2017   Previous cesarean delivery affecting pregnancy, antepartum 05/29/2017   Signed TOLAC consent   Past Surgical History:  Procedure Laterality Date   CESAREAN SECTION  08/2014   CESAREAN SECTION N/A 11/18/2017   Procedure: CESAREAN SECTION;  Surgeon: 01/18/2018, MD;  Location: [facility] BIRTHING SUITES;  Service: Obstetrics;  Laterality: N/A;   CHOLECYSTECTOMY N/A 07/30/2018   Procedure: LAPAROSCOPIC CHOLECYSTECTOMY WITH INTRAOPERATIVE CHOLANGIOGRAM;  Surgeon: 08/01/2018, MD;  Location: [facility];  Service: General;  Laterality: N/A;   Family History  Problem Relation Age of Onset   Diabetes Mother    Social History   Tobacco Use   Smoking status: Former    Packs/day: 1.00    Pack years: 0.00     Types: Cigarettes    Quit date: 02/20/2017    Years since quitting: 3.9   Smokeless tobacco: Never   Tobacco comments:    stopped with pregnancy  Vaping Use   Vaping Use: Former  Substance Use Topics   Alcohol use: Not Currently    Comment:     Drug use: Not Currently    Types: Marijuana    Comment: stopped 02/2017   No Known Allergies No current outpatient medications on file prior to visit.   No current facility-administered medications on file prior to visit.    Review of Systems Pertinent items noted in HPI and remainder of comprehensive ROS otherwise negative. Physical Exam:   Vitals:   02/10/21 1528  BP: 102/67  Pulse: 85  Weight: 206 lb 6.4 oz (93.6 kg)   Fetal Heart Rate (bpm): 155  Constitutional: Well-developed, well-nourished pregnant female in no acute distress.  HEENT: PERRLA Skin: normal color and turgor, no rash Cardiovascular: normal rate & rhythm, no murmur Respiratory: normal effort, lung sounds clear throughout GI: Abd soft, non-tender, pos BS x 4, gravid appropriate for gestational age MS: Extremities nontender, no edema, normal ROM Neurologic: Alert and oriented x 4.  GU: no CVA tenderness Pelvic: NEFG, physiologic discharge, no blood,  cervix clean. Pap/swabs collected  Assessment:    Pregnancy: [id number] Patient Active Problem List   Diagnosis Date Noted   Supervision of high risk pregnancy, antepartum 02/10/2021   Former cigarette smoker 05/18/2017     Plan:    1. Supervision of low-risk pregnancy, second trimester - Doing well, started to feel fetal movement - CBC/D/Plt+RPR+Rh+ABO+RubIgG... - Cytology - PAP( [place]) - [place] MFM OB COMP + 14 WK; Future - Genetic Screening - Culture, OB Urine - Hemoglobin A1c - AFP, Serum, Open Spina Bifida - Prenatal Vit-Fe Fumarate-FA (WESTAB PLUS) 27-1 MG TABS; Take 1 tablet by mouth daily.  Dispense: 30 tablet; Refill: 3  2. [redacted] weeks gestation of pregnancy - Routine new OB care as listed  below - Initial labs drawn. - Continue prenatal vitamins. - Problem list reviewed and updated. - Genetic Screening discussed, Quad screen, and NIPS: ordered. - Ultrasound discussed; fetal anatomic survey: ordered. - Anticipatory guidance about prenatal visits given including labs, ultrasounds, and testing. - Discussed usage of Babyscripts and virtual visits as additional source of managing and completing prenatal visits in midst of coronavirus and pandemic.   - Encouraged to complete MyChart Registration for her ability to review results, send requests, and have questions addressed.  - The nature of  - [facility]ulty [facility] with multiple MDs and Advanced Practice Providers was explained to patient; also emphasized that residents, students are part of our team.  3. Food insecurity - AMBULATORY REFERRAL TO [facility]  4. Round ligament pain - reviewed round ligament massages/stretching to ease pain  Routine obstetric precautions reviewed. Encouraged to seek out care at office or emergency room [facility] preferred) for urgent and/or emergent concerns. Return in about 4 weeks (around 03/10/2021) for IN-PERSON, HROB.     Edd Arbour, MSN, CNM, IBCLC Certified Nurse Midwife, [facility]

## 2021-02-14 LAB — CULTURE, OB URINE

## 2021-02-14 LAB — URINE CULTURE, OB REFLEX

## 2021-02-26 MED ORDER — CEFADROXIL 500 MG PO CAPS
500.0000 mg | ORAL_CAPSULE | Freq: Two times a day (BID) | ORAL | 0 refills | Status: DC
Start: 2021-02-26 — End: 2021-07-13

## 2021-02-26 NOTE — Addendum Note (Signed)
Addended by: Edd Arbour on: 02/26/2021 05:25 PM   Modules accepted: Orders

## 2021-03-03 ENCOUNTER — Encounter: Payer: Self-pay | Admitting: *Deleted

## 2021-03-04 ENCOUNTER — Ambulatory Visit: Payer: Medicaid Other

## 2021-03-10 ENCOUNTER — Encounter: Payer: Medicaid Other | Admitting: Certified Nurse Midwife

## 2021-03-26 ENCOUNTER — Other Ambulatory Visit: Payer: Self-pay | Admitting: *Deleted

## 2021-03-26 ENCOUNTER — Other Ambulatory Visit: Payer: Self-pay

## 2021-03-26 ENCOUNTER — Other Ambulatory Visit: Payer: Self-pay | Admitting: Certified Nurse Midwife

## 2021-03-26 ENCOUNTER — Ambulatory Visit: Payer: Medicaid Other | Attending: Certified Nurse Midwife

## 2021-03-26 DIAGNOSIS — O34219 Maternal care for unspecified type scar from previous cesarean delivery: Secondary | ICD-10-CM | POA: Diagnosis not present

## 2021-03-26 DIAGNOSIS — Z363 Encounter for antenatal screening for malformations: Secondary | ICD-10-CM | POA: Diagnosis not present

## 2021-03-26 DIAGNOSIS — Z3A23 23 weeks gestation of pregnancy: Secondary | ICD-10-CM | POA: Insufficient documentation

## 2021-03-26 DIAGNOSIS — O99212 Obesity complicating pregnancy, second trimester: Secondary | ICD-10-CM | POA: Diagnosis not present

## 2021-03-26 DIAGNOSIS — Z3492 Encounter for supervision of normal pregnancy, unspecified, second trimester: Secondary | ICD-10-CM

## 2021-03-26 DIAGNOSIS — Z3689 Encounter for other specified antenatal screening: Secondary | ICD-10-CM

## 2021-04-13 ENCOUNTER — Other Ambulatory Visit: Payer: Self-pay

## 2021-04-13 DIAGNOSIS — O2342 Unspecified infection of urinary tract in pregnancy, second trimester: Secondary | ICD-10-CM

## 2021-04-23 ENCOUNTER — Other Ambulatory Visit: Payer: Self-pay | Admitting: *Deleted

## 2021-04-23 ENCOUNTER — Encounter: Payer: Self-pay | Admitting: *Deleted

## 2021-04-23 ENCOUNTER — Ambulatory Visit: Payer: Medicaid Other | Admitting: *Deleted

## 2021-04-23 ENCOUNTER — Other Ambulatory Visit: Payer: Self-pay

## 2021-04-23 ENCOUNTER — Ambulatory Visit: Payer: Medicaid Other | Attending: Obstetrics and Gynecology

## 2021-04-23 VITALS — BP 97/59 | HR 88

## 2021-04-23 DIAGNOSIS — Z6832 Body mass index (BMI) 32.0-32.9, adult: Secondary | ICD-10-CM | POA: Insufficient documentation

## 2021-04-23 DIAGNOSIS — O99212 Obesity complicating pregnancy, second trimester: Secondary | ICD-10-CM

## 2021-04-23 DIAGNOSIS — Z3689 Encounter for other specified antenatal screening: Secondary | ICD-10-CM | POA: Diagnosis not present

## 2021-04-23 DIAGNOSIS — Z3A27 27 weeks gestation of pregnancy: Secondary | ICD-10-CM

## 2021-04-23 DIAGNOSIS — E669 Obesity, unspecified: Secondary | ICD-10-CM | POA: Diagnosis not present

## 2021-04-23 DIAGNOSIS — O34219 Maternal care for unspecified type scar from previous cesarean delivery: Secondary | ICD-10-CM

## 2021-05-21 ENCOUNTER — Ambulatory Visit: Payer: Medicaid Other | Attending: Obstetrics and Gynecology

## 2021-05-21 ENCOUNTER — Ambulatory Visit: Payer: Medicaid Other | Admitting: *Deleted

## 2021-05-21 ENCOUNTER — Encounter: Payer: Self-pay | Admitting: *Deleted

## 2021-05-21 ENCOUNTER — Other Ambulatory Visit: Payer: Self-pay

## 2021-05-21 VITALS — BP 108/66 | HR 72

## 2021-05-21 DIAGNOSIS — Z6832 Body mass index (BMI) 32.0-32.9, adult: Secondary | ICD-10-CM

## 2021-05-21 DIAGNOSIS — O99213 Obesity complicating pregnancy, third trimester: Secondary | ICD-10-CM | POA: Insufficient documentation

## 2021-05-21 DIAGNOSIS — Z3A31 31 weeks gestation of pregnancy: Secondary | ICD-10-CM | POA: Insufficient documentation

## 2021-05-21 DIAGNOSIS — O34219 Maternal care for unspecified type scar from previous cesarean delivery: Secondary | ICD-10-CM | POA: Diagnosis not present

## 2021-05-21 DIAGNOSIS — Z363 Encounter for antenatal screening for malformations: Secondary | ICD-10-CM | POA: Diagnosis not present

## 2021-05-21 DIAGNOSIS — E669 Obesity, unspecified: Secondary | ICD-10-CM | POA: Diagnosis not present

## 2021-05-27 ENCOUNTER — Ambulatory Visit: Payer: Medicaid Other

## 2021-05-27 ENCOUNTER — Other Ambulatory Visit: Payer: Medicaid Other

## 2021-06-01 ENCOUNTER — Encounter: Payer: Medicaid Other | Admitting: Obstetrics and Gynecology

## 2021-06-22 ENCOUNTER — Ambulatory Visit (INDEPENDENT_AMBULATORY_CARE_PROVIDER_SITE_OTHER): Payer: Medicaid Other | Admitting: Family Medicine

## 2021-06-22 ENCOUNTER — Encounter: Payer: Self-pay | Admitting: Family Medicine

## 2021-06-22 ENCOUNTER — Other Ambulatory Visit: Payer: Self-pay

## 2021-06-22 VITALS — BP 128/70 | HR 88 | Wt 220.6 lb

## 2021-06-22 DIAGNOSIS — R300 Dysuria: Secondary | ICD-10-CM

## 2021-06-22 DIAGNOSIS — O0930 Supervision of pregnancy with insufficient antenatal care, unspecified trimester: Secondary | ICD-10-CM | POA: Insufficient documentation

## 2021-06-22 DIAGNOSIS — O0990 Supervision of high risk pregnancy, unspecified, unspecified trimester: Secondary | ICD-10-CM | POA: Diagnosis not present

## 2021-06-22 DIAGNOSIS — R350 Frequency of micturition: Secondary | ICD-10-CM

## 2021-06-22 DIAGNOSIS — Z98891 History of uterine scar from previous surgery: Secondary | ICD-10-CM | POA: Insufficient documentation

## 2021-06-22 DIAGNOSIS — O0933 Supervision of pregnancy with insufficient antenatal care, third trimester: Secondary | ICD-10-CM

## 2021-06-22 LAB — POCT URINALYSIS DIP (DEVICE)
Bilirubin Urine: NEGATIVE
Glucose, UA: NEGATIVE mg/dL
Hgb urine dipstick: NEGATIVE
Ketones, ur: NEGATIVE mg/dL
Leukocytes,Ua: NEGATIVE
Nitrite: NEGATIVE
Protein, ur: NEGATIVE mg/dL
Specific Gravity, Urine: >=1.03 (ref 1.005–1.030)
Urobilinogen, UA: 0.2 mg/dL (ref 0.0–1.0)
pH: 7 (ref 5.0–8.0)

## 2021-06-22 NOTE — Patient Instructions (Signed)

## 2021-06-22 NOTE — Progress Notes (Signed)
   Subjective:  Erin Benjamin is a 30 y.o. G3P2002 at [redacted]w[redacted]d being seen today for ongoing prenatal care.  She is currently monitored for the following issues for this high-risk pregnancy and has Former cigarette smoker; Supervision of high risk pregnancy, antepartum; History of cesarean delivery; and Limited prenatal care on their problem list.  Patient reports no complaints.  Contractions: Not present. Vag. Bleeding: None.  Movement: Present. Denies leaking of fluid.   The following portions of the patient's history were reviewed and updated as appropriate: allergies, current medications, past family history, past medical history, past social history, past surgical history and problem list. Problem list updated.  Objective:   Vitals:   06/22/21 1513  BP: 128/70  Pulse: 88  Weight: 220 lb 9.6 oz (100.1 kg)    Fetal Status: Fetal Heart Rate (bpm): 155   Movement: Present     General:  Alert, oriented and cooperative. Patient is in no acute distress.  Skin: Skin is warm and dry. No rash noted.   Cardiovascular: Normal heart rate noted  Respiratory: Normal respiratory effort, no problems with respiration noted  Abdomen: Soft, gravid, appropriate for gestational age. Pain/Pressure: Absent     Pelvic: Vag. Bleeding: None Vag D/C Character: White   Cervical exam deferred        Extremities: Normal range of motion.  Edema: None  Mental Status: Normal mood and affect. Normal behavior. Normal judgment and thought content.   Urinalysis:      Assessment and Plan:  Pregnancy: G3P2002 at [redacted]w[redacted]d  1. Supervision of high risk pregnancy, antepartum BP and FHR normal Swabs next visit - POC Urinalysis Dipstick OB - Culture, OB Urine  2. Urinary frequency UA unremarkable, Ucx today - POC Urinalysis Dipstick OB - Culture, OB Urine  3. Burning with urination  - POC Urinalysis Dipstick OB - Culture, OB Urine  4. History of cesarean delivery X2 Discussed recommendation for RCS at 39 weeks,  patient initially planning TOLAC but after discussion OK with scheduling for 39wks and TOLAC only if SOL Scheduled for 07/18/21 at patient request  5. Limited prenatal care in third trimester Missed numerous visits, last seen in our office for new OB at 16 weeks Has been going to MFM Will draw A1c for third trimester DM screening as well as CBC/HIV/RPR Stressed importance of attending prenatal visits   Preterm labor symptoms and general obstetric precautions including but not limited to vaginal bleeding, contractions, leaking of fluid and fetal movement were reviewed in detail with the patient. Please refer to After Visit Summary for other counseling recommendations.  Return in 1 week (on 06/29/2021) for [facility], ob visit.   Venora Maples, MD

## 2021-06-23 LAB — CBC
Hematocrit: 35.3 % (ref 34.0–46.6)
Hemoglobin: 12.1 g/dL (ref 11.1–15.9)
MCH: 30 pg (ref 26.6–33.0)
MCHC: 34.3 g/dL (ref 31.5–35.7)
MCV: 87 fL (ref 79–97)
Platelets: 210 10*3/uL (ref 150–450)
RBC: 4.04 x10E6/uL (ref 3.77–5.28)
RDW: 12.4 % (ref 11.7–15.4)
WBC: 9.7 10*3/uL (ref 3.4–10.8)

## 2021-06-23 LAB — HIV ANTIBODY (ROUTINE TESTING W REFLEX): HIV Screen 4th Generation wRfx: NONREACTIVE

## 2021-06-23 LAB — HEMOGLOBIN A1C
Est. average glucose Bld gHb Est-mCnc: 105 mg/dL
Hgb A1c MFr Bld: 5.3 % (ref 4.8–5.6)

## 2021-06-23 LAB — RPR: RPR Ser Ql: NONREACTIVE

## 2021-06-26 LAB — URINE CULTURE, OB REFLEX

## 2021-06-26 LAB — CULTURE, OB URINE

## 2021-07-01 ENCOUNTER — Encounter: Payer: Medicaid Other | Admitting: Obstetrics and Gynecology

## 2021-07-06 ENCOUNTER — Other Ambulatory Visit: Payer: Self-pay | Admitting: Certified Nurse Midwife

## 2021-07-06 DIAGNOSIS — O2342 Unspecified infection of urinary tract in pregnancy, second trimester: Secondary | ICD-10-CM

## 2021-07-16 ENCOUNTER — Encounter (HOSPITAL_COMMUNITY)
Admission: RE | Admit: 2021-07-16 | Discharge: 2021-07-16 | Disposition: A | Discharge status: Home / Self Care | Payer: Medicaid Other | Source: Ambulatory Visit | Attending: Obstetrics and Gynecology | Admitting: Obstetrics and Gynecology

## 2021-07-16 ENCOUNTER — Other Ambulatory Visit: Payer: Self-pay | Admitting: Family Medicine

## 2021-07-16 NOTE — Patient Instructions (Signed)
Pernella Ackerley  07/16/2021   Your procedure is scheduled on:  07/18/2021  Arrive at 10:15 AM at Entrance C on [address] at [facility]  and [employer]. You are invited to use the FREE valet parking or use the Visitor's parking deck.  Pick up the phone at the desk and dial [phone].  Call this number if you have problems the morning of surgery: [phone]  Remember:   Do not eat food:(After Midnight) Desps de medianoche.  Do not drink clear liquids: (After Midnight) Desps de medianoche.  Take these medicines the morning of surgery with A SIP OF WATER:  none   Do not wear jewelry, make-up or nail polish.  Do not wear lotions, powders, or perfumes. Do not wear deodorant.  Do not shave 48 hours prior to surgery.  Do not bring valuables to the hospital.  [facility] is not   responsible for any belongings or valuables brought to the hospital.  Contacts, dentures or bridgework may not be worn into surgery.  Leave suitcase in the car. After surgery it may be brought to your room.  For patients admitted to the hospital, checkout time is 11:00 AM the day of              discharge.      Please read over the following fact sheets that you were given:     Preparing for Surgery

## 2021-07-17 NOTE — H&P (Signed)
OBSTETRIC ADMISSION HISTORY AND PHYSICAL  [place] Kirchner is a 30 y.o. female G3P2002 with IUP at [redacted]w[redacted]d by early [place] presenting for scheduled repeat cesarean section. She reports +FMs, No LOF, no VB, no blurry vision, headaches or peripheral edema, and RUQ pain.  She plans on breast and bottle feeding. She is considering Nexplanon or POPs  for birth control. She received her prenatal care at  [facility]    Dating: By early [place] --->  Estimated Date of Delivery: 07/22/21  Sono:   @[redacted]w[redacted]d , CWD, normal anatomy, cephalic presentation, anterior placental lie, 1653g, 29% EFW   Prenatal History/Complications:  Limited prenatal care Maternal obesity  Past Medical History: Past Medical History:  Diagnosis Date   Bacterial vaginosis 06/15/2017   Found on NOB pap; not treated.  Pt has no s/s on 11-1. Consider repeat wet prep at [facility].    Cholelithiasis with acute cholecystitis 07/30/2018   Diverticulitis of colon with perforation 11/10/2019   Medical history non-contributory    Post partum depression    Post-operative state 11/18/2017   Previous cesarean delivery affecting pregnancy, antepartum 05/29/2017   Signed TOLAC consent    Past Surgical History: Past Surgical History:  Procedure Laterality Date   CESAREAN SECTION  08/2014   CESAREAN SECTION N/A 11/18/2017   Procedure: CESAREAN SECTION;  Surgeon: 01/18/2018, MD;  Location: [facility] BIRTHING SUITES;  Service: Obstetrics;  Laterality: N/A;   CHOLECYSTECTOMY N/A 07/30/2018   Procedure: LAPAROSCOPIC CHOLECYSTECTOMY WITH INTRAOPERATIVE CHOLANGIOGRAM;  Surgeon: 08/01/2018, MD;  Location: [facility] OR;  Service: General;  Laterality: N/A;    Obstetrical History: OB History     Gravida  3   Para  2   Term  2   Preterm  0   AB  0   Living  2      SAB      IAB      Ectopic      Multiple  0   Live Births  2           Social History Social History   Socioeconomic History   Marital status: Single    Spouse name: Not on file    Number of children: Not on file   Years of education: Not on file   Highest education level: Not on file  Occupational History   Not on file  Tobacco Use   Smoking status: Former    Packs/day: 1.00    Types: Cigarettes    Quit date: 02/20/2017    Years since quitting: 4.4   Smokeless tobacco: Never   Tobacco comments:    stopped with pregnancy  Vaping Use   Vaping Use: Former  Substance and Sexual Activity   Alcohol use: Not Currently    Comment:     Drug use: Not Currently    Types: Marijuana    Comment: stopped 02/2017   Sexual activity: Yes    Birth control/protection: None  Other Topics Concern   Not on file  Social History Narrative   Not on file   Social Determinants of Health   Financial Resource Strain: Not on file  Food Insecurity: Food Insecurity Present   Worried About Running Out of Food in the Last Year: Never true   Ran Out of Food in the Last Year: Sometimes true  Transportation Needs: Unmet Transportation Needs   Lack of Transportation (Medical): No   Lack of Transportation (Non-Medical): Yes  Physical Activity: Not on file  Stress: Not on file  Social Connections:  Not on file    Family History: Family History  Problem Relation Age of Onset   Diabetes Mother     Allergies: No Known Allergies  Medications Prior to Admission  Medication Sig Dispense Refill Last Dose   Prenatal Vit-Fe Fumarate-FA (WESTAB PLUS) 27-1 MG TABS Take 1 tablet by mouth daily. 30 tablet 3 Past Week     Review of Systems   All systems reviewed and negative except as stated in HPI  Blood pressure 120/87, pulse 80, temperature 98.3 F (36.8 C), temperature source Oral, resp. rate 18, height 5\' 8"  (1.727 m), weight 104.1 kg, last menstrual period 10/10/2020. General appearance: alert Lungs: clear to auscultation bilaterally Heart: regular rate and rhythm Abdomen: soft, non-tender; bowel sounds normal Extremities: Homans sign is negative, no sign of DVT Presentation:  cephalic on last 10/12/2020      Prenatal labs: ABO, Rh: --/--/O POS (12/04 1132) Antibody: NEG (12/04 1132) Rubella: 1.68 (06/29 1623) RPR: Non Reactive (11/08 1555)  HBsAg: Negative (06/29 1623)  HIV: NON REACTIVE (12/04 1132)  GBS:   unknown - did not come for 36 week visit 1 hr Glucola not done due to limited prenatal care and missed visits. A1C normal Genetic screening  AFP neg, horizon neg Anatomy 10-27-1970 normal  Prenatal Transfer Tool  Maternal Diabetes: No Genetic Screening: Normal Maternal Ultrasounds/Referrals: Normal Fetal Ultrasounds or other Referrals:  None Maternal Substance Abuse:  No Significant Maternal Medications:  None Significant Maternal Lab Results: Other: GBS unknown  Results for orders placed or performed during the hospital encounter of 07/18/21 (from the past 24 hour(s))  CBC   Collection Time: 07/18/21 11:32 AM  Result Value Ref Range   WBC 9.6 4.0 - 10.5 K/uL   RBC 4.01 3.87 - 5.11 MIL/uL   Hemoglobin 11.7 (L) 12.0 - 15.0 g/dL   HCT 14/04/22 22.4 - 82.5 %   MCV 90.5 80.0 - 100.0 fL   MCH 29.2 26.0 - 34.0 pg   MCHC 32.2 30.0 - 36.0 g/dL   RDW 00.3 70.4 - 88.8 %   Platelets 211 150 - 400 K/uL   nRBC 0.0 0.0 - 0.2 %  Rapid HIV screen (HIV 1/2 Ab+Ag)   Collection Time: 07/18/21 11:32 AM  Result Value Ref Range   HIV-1 P24 Antigen - HIV24 NON REACTIVE NON REACTIVE   HIV 1/2 Antibodies NON REACTIVE NON REACTIVE   Interpretation (HIV Ag Ab)      A non reactive test result means that HIV 1 or HIV 2 antibodies and HIV 1 p24 antigen were not detected in the specimen.  Type and screen   Collection Time: 07/18/21 11:32 AM  Result Value Ref Range   ABO/RH(D) O POS    Antibody Screen NEG    Sample Expiration      07/21/2021,2359 Performed at [facility], [address], [place], [place]   Resp Panel by RT-PCR (Flu A&B, Covid) Nasopharyngeal Swab   Collection Time: 07/18/21 11:36 AM   Specimen: Nasopharyngeal Swab; Nasopharyngeal(NP) swabs in  vial transport medium  Result Value Ref Range   SARS Coronavirus 2 by RT PCR NEGATIVE NEGATIVE   Influenza A by PCR NEGATIVE NEGATIVE   Influenza B by PCR NEGATIVE NEGATIVE    Patient Active Problem List   Diagnosis Date Noted   S/P repeat low transverse C-section 07/18/2021   History of cesarean delivery 06/22/2021   Limited prenatal care 06/22/2021   Supervision of high risk pregnancy, antepartum 02/10/2021   Former  cigarette smoker 05/18/2017    Assessment/Plan:  Emmani Lesueur is a 30 y.o. G3P2002 at [redacted]w[redacted]d here for scheduled repeat cesarean section at term  #Scheduled cesarean section The risks of cesarean section were discussed with the patient including but were not limited to: bleeding which may require transfusion or reoperation; infection which may require antibiotics; injury to bowel, bladder, ureters or other surrounding organs; injury to the fetus; need for additional procedures including hysterectomy in the event of a life-threatening hemorrhage; placental abnormalities wth subsequent pregnancies, incisional problems, thromboembolic phenomenon and other postoperative/anesthesia complications. The patient concurred with the proposed plan, giving informed written consent for the procedure.  Patient has been NPO since 2100 and she will remain NPO for procedure. Anesthesia and OR aware.  Preoperative prophylactic antibiotics and SCDs ordered on call to the OR.  To OR when ready.   #Pain: spinal #ID:  GBS unknown, will get pre-op antibiotics #MOF: breast and bottle #MOC Nexplanon or POPs #Circ:  N/A female fetus  Warner Mccreedy, MD  07/18/2021, 1:53 PM

## 2021-07-18 ENCOUNTER — Encounter (HOSPITAL_COMMUNITY): Payer: Self-pay

## 2021-07-18 ENCOUNTER — Other Ambulatory Visit: Payer: Self-pay

## 2021-07-18 ENCOUNTER — Inpatient Hospital Stay (HOSPITAL_COMMUNITY): Payer: Medicaid Other | Admitting: Anesthesiology

## 2021-07-18 ENCOUNTER — Inpatient Hospital Stay (HOSPITAL_COMMUNITY)
Admission: RE | Admit: 2021-07-18 | Discharge: 2021-07-21 | DRG: 788 | Disposition: A | Discharge status: Home / Self Care | Payer: Medicaid Other | Attending: Family Medicine | Admitting: Family Medicine

## 2021-07-18 ENCOUNTER — Encounter (HOSPITAL_COMMUNITY)
Admission: RE | Disposition: A | Discharge status: Home / Self Care | Payer: Self-pay | Source: Home / Self Care | Attending: Family Medicine

## 2021-07-18 ENCOUNTER — Encounter (HOSPITAL_COMMUNITY): Payer: Self-pay | Admitting: Family Medicine

## 2021-07-18 DIAGNOSIS — O99214 Obesity complicating childbirth: Secondary | ICD-10-CM | POA: Diagnosis present

## 2021-07-18 DIAGNOSIS — Z20822 Contact with and (suspected) exposure to covid-19: Secondary | ICD-10-CM | POA: Diagnosis present

## 2021-07-18 DIAGNOSIS — O34211 Maternal care for low transverse scar from previous cesarean delivery: Secondary | ICD-10-CM | POA: Diagnosis present

## 2021-07-18 DIAGNOSIS — O0930 Supervision of pregnancy with insufficient antenatal care, unspecified trimester: Secondary | ICD-10-CM

## 2021-07-18 DIAGNOSIS — O0990 Supervision of high risk pregnancy, unspecified, unspecified trimester: Secondary | ICD-10-CM

## 2021-07-18 DIAGNOSIS — Z3A39 39 weeks gestation of pregnancy: Secondary | ICD-10-CM

## 2021-07-18 DIAGNOSIS — Z98891 History of uterine scar from previous surgery: Principal | ICD-10-CM

## 2021-07-18 DIAGNOSIS — Z87891 Personal history of nicotine dependence: Secondary | ICD-10-CM

## 2021-07-18 HISTORY — DX: Postpartum depression: F53.0

## 2021-07-18 LAB — TYPE AND SCREEN
ABO/RH(D): O POS
Antibody Screen: NEGATIVE

## 2021-07-18 LAB — CBC
HCT: 36.3 % (ref 36.0–46.0)
Hemoglobin: 11.7 g/dL — ABNORMAL LOW (ref 12.0–15.0)
MCH: 29.2 pg (ref 26.0–34.0)
MCHC: 32.2 g/dL (ref 30.0–36.0)
MCV: 90.5 fL (ref 80.0–100.0)
Platelets: 211 10*3/uL (ref 150–400)
RBC: 4.01 MIL/uL (ref 3.87–5.11)
RDW: 13.5 % (ref 11.5–15.5)
WBC: 9.6 10*3/uL (ref 4.0–10.5)
nRBC: 0 % (ref 0.0–0.2)

## 2021-07-18 LAB — RAPID HIV SCREEN (HIV 1/2 AB+AG)
HIV 1/2 Antibodies: NONREACTIVE
HIV-1 P24 Antigen - HIV24: NONREACTIVE

## 2021-07-18 LAB — RESP PANEL BY RT-PCR (FLU A&B, COVID) ARPGX2
Influenza A by PCR: NEGATIVE
Influenza B by PCR: NEGATIVE
SARS Coronavirus 2 by RT PCR: NEGATIVE

## 2021-07-18 SURGERY — Surgical Case
Anesthesia: Spinal

## 2021-07-18 MED ORDER — TRANEXAMIC ACID-NACL 1000-0.7 MG/100ML-% IV SOLN
INTRAVENOUS | Status: AC
  Filled 2021-07-18: qty 100

## 2021-07-18 MED ORDER — SENNOSIDES-DOCUSATE SODIUM 8.6-50 MG PO TABS
2.0000 | ORAL_TABLET | Freq: Every day | ORAL | Status: DC
  Administered 2021-07-19 – 2021-07-20 (×2): 2 via ORAL
  Filled 2021-07-18 (×2): qty 2

## 2021-07-18 MED ORDER — SIMETHICONE 80 MG PO CHEW
80.0000 mg | CHEWABLE_TABLET | Freq: Three times a day (TID) | ORAL | Status: DC
  Administered 2021-07-19 – 2021-07-20 (×6): 80 mg via ORAL
  Filled 2021-07-18 (×6): qty 1

## 2021-07-18 MED ORDER — SCOPOLAMINE 1 MG/3DAYS TD PT72: 1.0000 | MEDICATED_PATCH | Freq: Once | TRANSDERMAL | Status: DC

## 2021-07-18 MED ORDER — OXYTOCIN-SODIUM CHLORIDE 30-0.9 UT/500ML-% IV SOLN
INTRAVENOUS | Status: AC
  Filled 2021-07-18: qty 500

## 2021-07-18 MED ORDER — COCONUT OIL OIL: 1.0000 "application " | TOPICAL_OIL | Status: DC | PRN

## 2021-07-18 MED ORDER — DEXAMETHASONE SODIUM PHOSPHATE 4 MG/ML IJ SOLN
INTRAMUSCULAR | Status: DC | PRN
  Administered 2021-07-18: 10 mg via INTRAVENOUS

## 2021-07-18 MED ORDER — TETANUS-DIPHTH-ACELL PERTUSSIS 5-2.5-18.5 LF-MCG/0.5 IM SUSY: 0.5000 mL | PREFILLED_SYRINGE | Freq: Once | INTRAMUSCULAR | Status: DC

## 2021-07-18 MED ORDER — MENTHOL 3 MG MT LOZG: 1.0000 | LOZENGE | OROMUCOSAL | Status: DC | PRN

## 2021-07-18 MED ORDER — SIMETHICONE 80 MG PO CHEW: 80.0000 mg | CHEWABLE_TABLET | ORAL | Status: DC | PRN

## 2021-07-18 MED ORDER — MORPHINE SULFATE (PF) 0.5 MG/ML IJ SOLN
INTRAMUSCULAR | Status: AC
  Filled 2021-07-18: qty 10

## 2021-07-18 MED ORDER — WITCH HAZEL-GLYCERIN EX PADS: 1.0000 "application " | MEDICATED_PAD | CUTANEOUS | Status: DC | PRN

## 2021-07-18 MED ORDER — TRANEXAMIC ACID-NACL 1000-0.7 MG/100ML-% IV SOLN
INTRAVENOUS | Status: DC | PRN
  Administered 2021-07-18: 1000 mg via INTRAVENOUS

## 2021-07-18 MED ORDER — PHENYLEPHRINE HCL-NACL 20-0.9 MG/250ML-% IV SOLN
INTRAVENOUS | Status: DC | PRN
  Administered 2021-07-18: 60 ug/min via INTRAVENOUS

## 2021-07-18 MED ORDER — HYDROMORPHONE HCL 1 MG/ML IJ SOLN: 0.2000 mg | INTRAMUSCULAR | Status: DC | PRN

## 2021-07-18 MED ORDER — NALBUPHINE HCL 10 MG/ML IJ SOLN: 5.0000 mg | INTRAMUSCULAR | Status: DC | PRN

## 2021-07-18 MED ORDER — ACETAMINOPHEN 500 MG PO TABS
1000.0000 mg | ORAL_TABLET | Freq: Four times a day (QID) | ORAL | Status: DC
  Administered 2021-07-18 – 2021-07-21 (×9): 1000 mg via ORAL
  Filled 2021-07-18 (×11): qty 2

## 2021-07-18 MED ORDER — MORPHINE SULFATE (PF) 0.5 MG/ML IJ SOLN
INTRAMUSCULAR | Status: DC | PRN
  Administered 2021-07-18: 150 ug via INTRATHECAL

## 2021-07-18 MED ORDER — KETOROLAC TROMETHAMINE 30 MG/ML IJ SOLN
30.0000 mg | Freq: Four times a day (QID) | INTRAMUSCULAR | Status: AC | PRN
  Administered 2021-07-18: 17:00:00 30 mg via INTRAVENOUS

## 2021-07-18 MED ORDER — BUPIVACAINE IN DEXTROSE 0.75-8.25 % IT SOLN
INTRATHECAL | Status: DC | PRN
Start: 2021-07-18 — End: 2021-07-18
  Administered 2021-07-18: 1.6 mL via INTRATHECAL

## 2021-07-18 MED ORDER — CEFAZOLIN SODIUM-DEXTROSE 2-4 GM/100ML-% IV SOLN
2.0000 g | INTRAVENOUS | Status: AC
  Administered 2021-07-18: 14:00:00 2 g via INTRAVENOUS

## 2021-07-18 MED ORDER — OXYTOCIN-SODIUM CHLORIDE 30-0.9 UT/500ML-% IV SOLN
INTRAVENOUS | Status: DC | PRN
  Administered 2021-07-18: 30 [IU] via INTRAVENOUS

## 2021-07-18 MED ORDER — KETOROLAC TROMETHAMINE 30 MG/ML IJ SOLN: 30.0000 mg | Freq: Four times a day (QID) | INTRAMUSCULAR | Status: AC | PRN

## 2021-07-18 MED ORDER — METHYLERGONOVINE MALEATE 0.2 MG/ML IJ SOLN
INTRAMUSCULAR | Status: AC
  Filled 2021-07-18: qty 1

## 2021-07-18 MED ORDER — DIPHENHYDRAMINE HCL 25 MG PO CAPS: 25.0000 mg | ORAL_CAPSULE | ORAL | Status: DC | PRN

## 2021-07-18 MED ORDER — ONDANSETRON HCL 4 MG/2ML IJ SOLN
INTRAMUSCULAR | Status: AC
  Filled 2021-07-18: qty 2

## 2021-07-18 MED ORDER — DEXAMETHASONE SODIUM PHOSPHATE 10 MG/ML IJ SOLN
INTRAMUSCULAR | Status: AC
  Filled 2021-07-18: qty 1

## 2021-07-18 MED ORDER — SODIUM CHLORIDE 0.9% FLUSH: 3.0000 mL | INTRAVENOUS | Status: DC | PRN

## 2021-07-18 MED ORDER — LACTATED RINGERS IV SOLN: INTRAVENOUS | Status: DC

## 2021-07-18 MED ORDER — NALOXONE HCL 4 MG/10ML IJ SOLN
1.0000 ug/kg/h | INTRAVENOUS | Status: DC | PRN
  Filled 2021-07-18: qty 5

## 2021-07-18 MED ORDER — NALOXONE HCL 0.4 MG/ML IJ SOLN: 0.4000 mg | INTRAMUSCULAR | Status: DC | PRN

## 2021-07-18 MED ORDER — CEFAZOLIN SODIUM-DEXTROSE 2-4 GM/100ML-% IV SOLN
INTRAVENOUS | Status: AC
  Filled 2021-07-18: qty 100

## 2021-07-18 MED ORDER — DIPHENHYDRAMINE HCL 25 MG PO CAPS: 25.0000 mg | ORAL_CAPSULE | Freq: Four times a day (QID) | ORAL | Status: DC | PRN

## 2021-07-18 MED ORDER — PHENYLEPHRINE HCL-NACL 20-0.9 MG/250ML-% IV SOLN
INTRAVENOUS | Status: AC
  Filled 2021-07-18: qty 250

## 2021-07-18 MED ORDER — KETOROLAC TROMETHAMINE 30 MG/ML IJ SOLN
30.0000 mg | Freq: Four times a day (QID) | INTRAMUSCULAR | Status: AC
  Administered 2021-07-19 (×2): 30 mg via INTRAVENOUS
  Filled 2021-07-18 (×2): qty 1

## 2021-07-18 MED ORDER — FENTANYL CITRATE (PF) 100 MCG/2ML IJ SOLN
INTRAMUSCULAR | Status: AC
  Filled 2021-07-18: qty 2

## 2021-07-18 MED ORDER — SOD CITRATE-CITRIC ACID 500-334 MG/5ML PO SOLN
30.0000 mL | ORAL | Status: AC
  Administered 2021-07-18: 14:00:00 30 mL via ORAL

## 2021-07-18 MED ORDER — ONDANSETRON HCL 4 MG/2ML IJ SOLN: 4.0000 mg | Freq: Three times a day (TID) | INTRAMUSCULAR | Status: DC | PRN

## 2021-07-18 MED ORDER — OXYCODONE HCL 5 MG PO TABS
5.0000 mg | ORAL_TABLET | ORAL | Status: DC | PRN
  Administered 2021-07-18 – 2021-07-20 (×3): 5 mg via ORAL
  Administered 2021-07-20 (×2): 10 mg via ORAL
  Filled 2021-07-18: qty 1
  Filled 2021-07-18: qty 2
  Filled 2021-07-18 (×2): qty 1
  Filled 2021-07-18: qty 2

## 2021-07-18 MED ORDER — DIBUCAINE (PERIANAL) 1 % EX OINT: 1.0000 "application " | TOPICAL_OINTMENT | CUTANEOUS | Status: DC | PRN

## 2021-07-18 MED ORDER — FENTANYL CITRATE (PF) 100 MCG/2ML IJ SOLN
INTRAMUSCULAR | Status: DC | PRN
  Administered 2021-07-18: 15 ug via INTRATHECAL

## 2021-07-18 MED ORDER — OXYTOCIN-SODIUM CHLORIDE 30-0.9 UT/500ML-% IV SOLN
2.5000 [IU]/h | INTRAVENOUS | Status: AC
  Administered 2021-07-19: 2.5 [IU]/h via INTRAVENOUS
  Filled 2021-07-18: qty 500

## 2021-07-18 MED ORDER — NALBUPHINE HCL 10 MG/ML IJ SOLN: 5.0000 mg | Freq: Once | INTRAMUSCULAR | Status: DC | PRN

## 2021-07-18 MED ORDER — ENOXAPARIN SODIUM 40 MG/0.4ML IJ SOSY
40.0000 mg | PREFILLED_SYRINGE | INTRAMUSCULAR | Status: DC
  Administered 2021-07-19 – 2021-07-20 (×2): 40 mg via SUBCUTANEOUS
  Filled 2021-07-18 (×2): qty 0.4

## 2021-07-18 MED ORDER — ZOLPIDEM TARTRATE 5 MG PO TABS: 5.0000 mg | ORAL_TABLET | Freq: Every evening | ORAL | Status: DC | PRN

## 2021-07-18 MED ORDER — SOD CITRATE-CITRIC ACID 500-334 MG/5ML PO SOLN
ORAL | Status: AC
  Filled 2021-07-18: qty 30

## 2021-07-18 MED ORDER — ONDANSETRON HCL 4 MG/2ML IJ SOLN
INTRAMUSCULAR | Status: DC | PRN
  Administered 2021-07-18: 4 mg via INTRAVENOUS

## 2021-07-18 MED ORDER — IBUPROFEN 600 MG PO TABS
600.0000 mg | ORAL_TABLET | Freq: Four times a day (QID) | ORAL | Status: DC
  Administered 2021-07-19 – 2021-07-21 (×7): 600 mg via ORAL
  Filled 2021-07-18 (×7): qty 1

## 2021-07-18 MED ORDER — DIPHENHYDRAMINE HCL 50 MG/ML IJ SOLN: 12.5000 mg | INTRAMUSCULAR | Status: DC | PRN

## 2021-07-18 MED ORDER — KETOROLAC TROMETHAMINE 30 MG/ML IJ SOLN
INTRAMUSCULAR | Status: AC
  Filled 2021-07-18: qty 1

## 2021-07-18 MED ORDER — MEPERIDINE HCL 25 MG/ML IJ SOLN: 6.2500 mg | INTRAMUSCULAR | Status: DC | PRN

## 2021-07-18 MED ORDER — PRENATAL MULTIVITAMIN CH
1.0000 | ORAL_TABLET | Freq: Every day | ORAL | Status: DC
  Administered 2021-07-19 – 2021-07-20 (×2): 1 via ORAL
  Filled 2021-07-18 (×2): qty 1

## 2021-07-18 MED ORDER — METHYLERGONOVINE MALEATE 0.2 MG/ML IJ SOLN
INTRAMUSCULAR | Status: DC | PRN
  Administered 2021-07-18: .2 mg via INTRAMUSCULAR

## 2021-07-18 MED ORDER — SCOPOLAMINE 1 MG/3DAYS TD PT72
MEDICATED_PATCH | TRANSDERMAL | Status: AC
  Filled 2021-07-18: qty 1

## 2021-07-18 SURGICAL SUPPLY — 31 items
BENZOIN TINCTURE PRP APPL 2/3 (GAUZE/BANDAGES/DRESSINGS) ×2 IMPLANT
CHLORAPREP W/TINT 26ML (MISCELLANEOUS) ×2 IMPLANT
CLAMP CORD UMBIL (MISCELLANEOUS) IMPLANT
CLOSURE STERI STRIP 1/2 X4 (GAUZE/BANDAGES/DRESSINGS) ×2 IMPLANT
CLOTH BEACON ORANGE TIMEOUT ST (SAFETY) ×2 IMPLANT
DRSG OPSITE POSTOP 4X10 (GAUZE/BANDAGES/DRESSINGS) ×2 IMPLANT
ELECT REM PT RETURN 9FT ADLT (ELECTROSURGICAL) ×2
ELECTRODE REM PT RTRN 9FT ADLT (ELECTROSURGICAL) ×1 IMPLANT
EXTRACTOR VACUUM KIWI (MISCELLANEOUS) IMPLANT
GLOVE SURG ENC MOIS LTX SZ6 (GLOVE) ×2 IMPLANT
GLOVE SURG UNDER POLY LF SZ7 (GLOVE) ×4 IMPLANT
GOWN STRL REUS W/TWL LRG LVL3 (GOWN DISPOSABLE) ×4 IMPLANT
KIT ABG SYR 3ML LUER SLIP (SYRINGE) IMPLANT
NEEDLE HYPO 22GX1.5 SAFETY (NEEDLE) IMPLANT
NEEDLE HYPO 25X5/8 SAFETYGLIDE (NEEDLE) IMPLANT
NS IRRIG 1000ML POUR BTL (IV SOLUTION) ×2 IMPLANT
PACK C SECTION WH (CUSTOM PROCEDURE TRAY) ×2 IMPLANT
PAD OB MATERNITY 4.3X12.25 (PERSONAL CARE ITEMS) ×2 IMPLANT
PENCIL SMOKE EVAC W/HOLSTER (ELECTROSURGICAL) ×2 IMPLANT
RETRACTOR WND ALEXIS 25 LRG (MISCELLANEOUS) IMPLANT
RTRCTR WOUND ALEXIS 25CM LRG (MISCELLANEOUS)
SUT PLAIN 2 0 (SUTURE) ×1
SUT PLAIN ABS 2-0 CT1 27XMFL (SUTURE) ×1 IMPLANT
SUT VIC AB 0 CT1 36 (SUTURE) ×10 IMPLANT
SUT VIC AB 2-0 CT1 27 (SUTURE) ×2
SUT VIC AB 2-0 CT1 TAPERPNT 27 (SUTURE) ×2 IMPLANT
SUT VIC AB 4-0 KS 27 (SUTURE) ×2 IMPLANT
SYR CONTROL 10ML LL (SYRINGE) IMPLANT
TOWEL OR 17X24 6PK STRL BLUE (TOWEL DISPOSABLE) ×2 IMPLANT
TRAY FOLEY W/BAG SLVR 14FR LF (SET/KITS/TRAYS/PACK) IMPLANT
WATER STERILE IRR 1000ML POUR (IV SOLUTION) ×2 IMPLANT

## 2021-07-18 NOTE — Op Note (Addendum)
Operative Note   Patient: Erin Benjamin  Date of Procedure: 07/18/2021  Procedure: Repeat Low Transverse Cesarean with uterine T extension  Indications:  scheduled repeat  Pre-operative Diagnosis: RCS.   Post-operative Diagnosis: Same  TOLAC Candidate: No  Surgeon: Surgeon(s) and Role:    * Milas Hock, MD - Primary    * Warner Mccreedy, MD  An experienced assistant was required given the standard of surgical care given the complexity of the case.  This assistant was needed for exposure, dissection, suctioning, retraction, instrument exchange, assisting with delivery with administration of fundal pressure, and for overall help during the procedure.   Anesthesia: spinal  Anesthesiologist: Achille Rich, MD   Antibiotics: Cefazolin   Estimated Blood Loss: 550 ml   Total IV Fluids:  Urine Output:  700 cc OF clear urine  Specimens: placenta, sent to path (for limited prenatal care)   Complications:  Uterine T extension and vacuum assisted delivery of head due to inability to deliver the head spontaneously    Indications: Erin Benjamin is a 30 y.o. [id number] with an IUP [redacted]w[redacted]d presenting for scheduled cesarean secondary to the indications listed above. Clinical course notable for limited prenatal care.  The risks of cesarean section discussed with the patient included but were not limited to: bleeding which may require transfusion or reoperation; infection which may require antibiotics; injury to bowel, bladder, ureters or other surrounding organs; injury to the fetus; need for additional procedures including hysterectomy in the event of a life-threatening hemorrhage; placental abnormalities with subsequent pregnancies, incisional problems, thromboembolic phenomenon and other postoperative/anesthesia complications. The patient concurred with the proposed plan, giving informed written consent for the procedure. Patient has been NPO since last night she will remain NPO for  procedure. Anesthesia and OR aware. Preoperative prophylactic antibiotics and SCDs ordered on call to the OR.   Findings: Viable infant in cephalic presentation, no nuchal cord present. Apgars 9 , 9. Weight 3140 g . Clear amniotic fluid. Normal placenta, three vessel cord. Uterus with significant peritoneal adhesions on the bilateral lateral aspect of the uterus. Unable to see, but felt like omentum adhered either to the fundus or the anterior abdominal wall above the point of surgery. Minimal scar tissue in the area of the pfannenstiel area. Ovaries and fallopian tubes not visualized.  Procedure Details: A Time Out was held and the above information confirmed. The patient received intravenous antibiotics and had sequential compression devices applied to her lower extremities preoperatively. The patient was taken back to the operative suite where spinal anesthesia was administered. After induction of anesthesia, the patient was draped and prepped in the usual sterile manner and placed in a dorsal supine position with a leftward tilt. A low transverse skin incision was made through patient's previous scar with scalpel and carried down through the subcutaneous tissue to the fascia. Fascial incision was made and extended transversely bluntly. The fascia was separated from the underlying rectus tissue superiorly and inferiorly. The rectus muscles were separated in the midline bluntly and the peritoneum was entered bluntly.   Initially there was an attempt to place the [facility] retractor however due to extensive peritoneal adhesions to the superior and lateral portions of the uterus the alexis retractor could not be placed. Instead a Rich retractor and bladder blade were placed to aid in visualization of the uterus. A bladder flap was not developed. A low transverse uterine incision was made. Fetal head was found to be immediately inferior to hysterotomy.  At this time fetal head was  attempted to be delivered by Dr.  Ephriam Jenkins with fundal pressure from Dr. Para March and copious amounts of amniotic fluid was expelled. At that time the fetal head was not well engaged in pelvis found to be floating in uterus and could not be brought through the hysterotomy. Fundal pressure and repositioning of the fetal head brought the head back to the hysterotomy. At this time Dr. Para March attempted to deliver fetal head, in the process the rectus muscles were incised and ultimately we placed a kiwi vacuum on fetal head to aid with delivery. However, due to extensive hair on fetal head and blood the kiwi vacuum did not have adequate suction. At this point, time for delivery was about 3 minutes.  At that time the hysterotomy was extended vertically in the midline (in a T) by about 2 cm and any OB in house was called. Simultaneously, a bell vacuum was utilized and the fetal head was successfully delivered. Immediately upon delivery, baby was crying vigorously and breathing well and had good color and tone.   The umbilical cord was clamped after 1 minute. Cord ph was not sent, and cord blood was obtained for evaluation. The placenta was removed Intact and appeared normal. The uterine incision was closed with running locked sutures of 0-Vicryl. The vertical extension was repaired with separate running locking sutures of 0-Vicryl. Ultimately a second imbricating layer was also placed with 0-Vicryl. Overall, excellent hemostasis was noted after cauterization of a few spots as well as one additional stitch at hysterotomy. The abdomen and the pelvis were cleared of all clot and debris. Hemostasis was confirmed on all surfaces.  The peritoneum was reapproximated using 2-0 vicryl . The rectus muscles were then closed with box stitches (two on left rectus and three on right rectus). The fascia was then closed using 0 Vicryl in a running fashion. The subcutaneous layer was irrigated and areas of bleeding were cauterized with the bovie. The subcutaneous layer was  then reapproximated with 2-0 plain gut and the skin was closed with a 4-0 vicryl subcuticular stitch. The patient tolerated the procedure well. Sponge, lap, instrument and needle counts were correct x 2. She was taken to the recovery room in stable condition.  Presence of scar tissue and etiology for difficulty delivering the baby was discussed with both the patient and her support person. All questions answered.   Disposition: PACU - hemodynamically stable.    Signed: Warner Mccreedy, MD, MPH [facility])

## 2021-07-18 NOTE — Lactation Note (Addendum)
This note was copied from a baby's chart. Lactation Consultation Note  Patient Name: Erin Benjamin Date: 07/18/2021 Reason for consult: Initial assessment;Mother's request;1st time breastfeeding;Term;Breastfeeding assistance;Nipple pain/trauma Age:29 hours Mom feeding plan to EBM. She has formula in the room but did not use it.   Mom shallow latch with nipple trauma, right more than left. Mom to use EBM for nipple care and coconut oil.   Mom hand pump to pre pump 5-10 min before latching.   LC assisted getting a deeper latch in cross cradle prone with signs of milk transfer.  Mom able to sustain the latch longer than 5 mins by compressing the breast in midst of feeding offering more volume. Mom denied any pain with the latch.   Plan 1. To feed based on cues 8-12x 24hr period. Mom to offer breasts and look for signs of milk transfer.  2. I and O sheet reviewed.  All questions answered at the end of the visit.   Maternal Data Has patient been taught Hand Expression?: Yes  Feeding Mother's Current Feeding Choice: Breast Milk  LATCH Score Latch: Repeated attempts needed to sustain latch, nipple held in mouth throughout feeding, stimulation needed to elicit sucking reflex.  Audible Swallowing: Spontaneous and intermittent  Type of Nipple: Flat  Comfort (Breast/Nipple): Filling, red/small blisters or bruises, mild/mod discomfort  Hold (Positioning): Assistance needed to correctly position infant at breast and maintain latch.  LATCH Score: 6   Lactation Tools Discussed/Used Tools: Flanges;Pump;Coconut oil Flange Size: 21 Breast pump type: Manual Pump Education: Setup, frequency, and cleaning;Milk Storage Reason for Pumping: elongate her nipples Pumping frequency: pre pump 5-10 min before latching.  Interventions Interventions: Breast feeding basics reviewed;Assisted with latch;Skin to skin;Breast massage;Hand express;Breast compression;Adjust position;Support  pillows;Position options;Expressed milk;Coconut oil;Education;LC Psychologist, educational;Infant Driven Feeding Algorithm education;Hand pump  Discharge WIC Program: Yes (Mom working on latching. Mom will decide in the morning if she prefers to get a pump from [facility] or formula. Mom feeding plan for now EBF. She has formula in the room but has not used it yet.)  Consult Status Consult Status: Follow-up Date: 07/19/21 Follow-up type: In-patient    Dyami Umbach  Nicholson-Springer 07/18/2021, 11:17 PM

## 2021-07-18 NOTE — Discharge Summary (Signed)
Postpartum Discharge Summary      Patient Name: Erin Benjamin DOB: Dec 21, 1990 MRN: [id number]  Date of admission: 07/18/2021 Delivery date:07/18/2021  Delivering provider: Radene Gunning  Date of discharge: 07/21/2021  Admitting diagnosis: S/P repeat low transverse C-section [Z98.891] Intrauterine pregnancy: [redacted]w[redacted]d    Secondary diagnosis:  Principal Problem:   S/P repeat low transverse C-section Active Problems:   Supervision of high risk pregnancy, antepartum   History of cesarean delivery   Limited prenatal care  Additional problems: None    Discharge diagnosis: Term Pregnancy Delivered                                              Post partum procedures: None Augmentation: N/A Complications: None  Hospital course: Sceduled C/S   30y.o. yo G3P2002 at 369w3das admitted to the hospital 07/18/2021 for scheduled cesarean section with the following indication:Elective Repeat.Delivery details are as follows:  Membrane Rupture Time/Date: 2:44 PM ,07/18/2021   Delivery Method:C-Section, Low Transverse  Details of operation can be found in separate operative note.  Patient had an uncomplicated postpartum course.  She is ambulating, tolerating a regular diet, passing flatus, and urinating well. Patient is discharged home in stable condition on  07/21/21        Newborn Data: Birth date:07/18/2021  Birth time:2:49 PM  Gender:Female  Living status:Living  Apgars:9 ,9  Weight:3140 g     Magnesium Sulfate received: No BMZ received: No Rhophylac:N/A MMR:N/A T-DaP: offered post partum Flu: No Transfusion:No  Physical exam  Vitals:   07/20/21 0500 07/20/21 1541 07/20/21 2119 07/21/21 0519  BP: 111/66 110/84 114/82 112/86  Pulse: (!) 57  68 66  Resp: _0 Temp: 97.9 F (36.6 C) 98.6 F (37 C) 98.5 F (36.9 C) 98.5 F (36.9 C)  TempSrc: Oral Oral Oral Oral  SpO2:  100% 100% 100%  Weight:      Height:       General: alert Lochia: appropriate Uterine Fundus:  firm Incision: Dressing is clean, dry, and intact DVT Evaluation: No evidence of DVT seen on physical exam. Labs: Lab Results  Component Value Date   WBC 15.3 (H) 07/19/2021   HGB 10.4 (L) 07/19/2021   HCT 30.9 (L) 07/19/2021   MCV 90.6 07/19/2021   PLT 172 07/19/2021   CMP Latest Ref Rng & Units 07/18/2020  Glucose 70 - 99 mg/dL 104(H)  BUN 6 - 20 mg/dL 9  Creatinine 0.44 - 1.00 mg/dL 0.81  Sodium 135 - 145 mmol/L 140  Potassium 3.5 - 5.1 mmol/L 3.9  Chloride 98 - 111 mmol/L 107  CO2 22 - 32 mmol/L 20(L)  Calcium 8.9 - 10.3 mg/dL 8.9  Total Protein 6.5 - 8.1 g/dL -  Total Bilirubin 0.3 - 1.2 mg/dL -  Alkaline Phos 38 - 126 U/L -  AST 15 - 41 U/L -  ALT 0 - 44 U/L -   Edinburgh Score: Edinburgh Postnatal Depression Scale Screening Tool 07/19/2021  I have been able to laugh and see the funny side of things. 0  I have looked forward with enjoyment to things. 1  I have blamed myself unnecessarily when things went wrong. 2  I have been anxious or worried for no good reason. 2  I have felt scared or panicky for no good reason. 2  Things have been getting on top  of me. 2  I have been so unhappy that I have had difficulty sleeping. 2  I have felt sad or miserable. 1  I have been so unhappy that I have been crying. 1  The thought of harming myself has occurred to me. 0  Edinburgh Postnatal Depression Scale Total 13     After visit meds:  Allergies as of 07/21/2021   No Known Allergies      Medication List     TAKE these medications    acetaminophen 500 MG tablet Commonly known as: TYLENOL Take 2 tablets (1,000 mg total) by mouth every 6 (six) hours.   ibuprofen 600 MG tablet Commonly known as: ADVIL Take 1 tablet (600 mg total) by mouth every 6 (six) hours.   oxyCODONE 5 MG immediate release tablet Commonly known as: Oxy IR/ROXICODONE Take 1-2 tablets (5-10 mg total) by mouth every 4 (four) hours as needed for moderate pain.   polyethylene glycol 17 g  packet Commonly known as: MIRALAX / GLYCOLAX Take 17 g by mouth daily as needed for mild constipation.   WesTab Plus 27-1 MG Tabs Take 1 tablet by mouth daily.         Discharge home in stable condition Infant Feeding: Bottle and Breast Infant Disposition:home with mother Discharge instruction: per After Visit Summary and Postpartum booklet. Activity: Advance as tolerated. Pelvic rest for 6 weeks.  Diet: routine diet Future Appointments: Future Appointments  Date Time Provider [place]  07/26/2021  9:20 AM [facility] NURSE [facility]  08/03/2021  9:15 AM [facility] HEALTH CLINICIAN [facility]  08/17/2021  8:55 AM Ardean Larsen, Mervyn Skeeters, CNM [facility]   Follow up Visit: Message sent to [facility] by Dr. Cy Blamer on 12/4  Please schedule this patient for a Virtual postpartum visit in 4 weeks with the following provider: Any provider. Additional Postpartum F/U:Postpartum Depression checkup and Incision check 1 week  Low risk pregnancy complicated by:  limited prenatal care, patient experiencing homelessness, and transportation difficulties and BP check Delivery mode:  C-Section, Low Transverse  Anticipated Birth Control:   Plans Nexplanon outpatient   Renard Matter, MD, MPH OB Fellow, Faculty Practice

## 2021-07-18 NOTE — Anesthesia Preprocedure Evaluation (Signed)
Anesthesia Evaluation  Patient identified by MRN, date of birth, ID band Patient awake    Reviewed: Allergy & Precautions, H&P , NPO status , Patient's Chart, lab work & pertinent test results  Airway Mallampati: II   Neck ROM: full    Dental   Pulmonary former smoker,    breath sounds clear to auscultation       Cardiovascular negative cardio ROS   Rhythm:regular Rate:Normal     Neuro/Psych PSYCHIATRIC DISORDERS Depression    GI/Hepatic   Endo/Other    Renal/GU      Musculoskeletal   Abdominal   Peds  Hematology   Anesthesia Other Findings   Reproductive/Obstetrics (+) Pregnancy                             Anesthesia Physical Anesthesia Plan  ASA: 2  Anesthesia Plan: Spinal   Post-op Pain Management:    Induction: Intravenous  PONV Risk Score and Plan: 2 and Treatment may vary due to age or medical condition  Airway Management Planned: Nasal Cannula  Additional Equipment:   Intra-op Plan:   Post-operative Plan:   Informed Consent: I have reviewed the patients History and Physical, chart, labs and discussed the procedure including the risks, benefits and alternatives for the proposed anesthesia with the patient or authorized representative who has indicated his/her understanding and acceptance.     Dental advisory given  Plan Discussed with: Anesthesiologist, CRNA and Surgeon  Anesthesia Plan Comments:         Anesthesia Quick Evaluation

## 2021-07-18 NOTE — Progress Notes (Signed)
Pt was instructed yesterday to be at the hospital at 0745 Today to have labs completed pre-procedure. Pt has still not showed up today. Called Pt with no answer. MD made aware.

## 2021-07-18 NOTE — Anesthesia Procedure Notes (Signed)
Spinal  Patient location during procedure: OR Start time: 07/18/2021 2:15 PM End time: 07/18/2021 2:17 PM Reason for block: surgical anesthesia Staffing Performed: anesthesiologist  Anesthesiologist: Achille Rich, MD Preanesthetic Checklist Completed: patient identified, IV checked, risks and benefits discussed, surgical consent, monitors and equipment checked, pre-op evaluation and timeout performed Spinal Block Patient position: sitting Prep: DuraPrep Patient monitoring: cardiac monitor, continuous pulse ox and blood pressure Approach: midline Injection technique: single-shot Needle Needle type: Pencan  Needle gauge: 24 G Needle length: 9 cm Assessment Sensory level: T10 Events: CSF return Additional Notes Functioning IV was confirmed and monitors were applied. Sterile prep and drape, including hand hygiene and sterile gloves were used. The patient was positioned and the spine was prepped. The skin was anesthetized with lidocaine.  Free flow of clear CSF was obtained prior to injecting local anesthetic into the CSF.  The spinal needle aspirated freely following injection.  The needle was carefully withdrawn.  The patient tolerated the procedure well.

## 2021-07-18 NOTE — Transfer of Care (Signed)
Immediate Anesthesia Transfer of Care Note  Patient: Erin Benjamin  Procedure(s) Performed: CESAREAN SECTION  Patient Location: PACU  Anesthesia Type:Spinal  Level of Consciousness: awake, alert  and oriented  Airway & Oxygen Therapy: Patient Spontanous Breathing  Post-op Assessment: Report given to RN and Post -op Vital signs reviewed and stable  Post vital signs: Reviewed and stable  Last Vitals:  Vitals Value Taken Time  BP 121/85 07/18/21 1600  Temp 36.1 C 07/18/21 1600  Pulse 54 07/18/21 1609  Resp 17 07/18/21 1609  SpO2 100 % 07/18/21 1609  Vitals shown include unvalidated device data.  Last Pain:  Vitals:   07/18/21 1600  TempSrc: Axillary         Complications: No notable events documented.

## 2021-07-19 LAB — CBC
HCT: 30.9 % — ABNORMAL LOW (ref 36.0–46.0)
Hemoglobin: 10.4 g/dL — ABNORMAL LOW (ref 12.0–15.0)
MCH: 30.5 pg (ref 26.0–34.0)
MCHC: 33.7 g/dL (ref 30.0–36.0)
MCV: 90.6 fL (ref 80.0–100.0)
Platelets: 172 10*3/uL (ref 150–400)
RBC: 3.41 MIL/uL — ABNORMAL LOW (ref 3.87–5.11)
RDW: 13.4 % (ref 11.5–15.5)
WBC: 15.3 10*3/uL — ABNORMAL HIGH (ref 4.0–10.5)
nRBC: 0 % (ref 0.0–0.2)

## 2021-07-19 LAB — RPR: RPR Ser Ql: NONREACTIVE

## 2021-07-19 MED ORDER — ETONOGESTREL 68 MG ~~LOC~~ IMPL: 68.0000 mg | DRUG_IMPLANT | Freq: Once | SUBCUTANEOUS | Status: DC

## 2021-07-19 MED ORDER — LIDOCAINE HCL 1 % IJ SOLN
0.0000 mL | Freq: Once | INTRAMUSCULAR | Status: DC | PRN
Start: 2021-07-19 — End: 2021-07-21

## 2021-07-19 MED ORDER — COVID-19 MRNA VAC-TRIS(PFIZER) 30 MCG/0.3ML IM SUSP
0.3000 mL | Freq: Once | INTRAMUSCULAR | Status: DC
  Filled 2021-07-19: qty 0.3

## 2021-07-19 NOTE — Progress Notes (Addendum)
Patient ID: Erin Benjamin, female   DOB: 12-Apr-1991, 30 y.o.   MRN: [id number] POSTPARTUM PROGRESS NOTE  Post Partum Day 1  Subjective:  Erin Benjamin is a 30 y.o. G3P3003 s/p rLTCS @1449  at [redacted]w[redacted]d. EBL 550cc.  No acute events overnight.  Pt denies problems with ambulating, voiding or po intake.  She denies nausea or vomiting.  Pain is well controlled.  She has had flatus. She has not had bowel movement. Due to void as foley was just removed.  Lochia Moderate.   Objective: Blood pressure 107/70, pulse 61, temperature 97.8 F (36.6 C), resp. rate 16, height 5\' 8"  (1.727 m), weight 104.1 kg, last menstrual period 10/10/2020, SpO2 97 %, unknown if currently breastfeeding.  Physical Exam:  General: alert, cooperative and no distress Chest: no respiratory distress Heart:regular rate, distal pulses intact Abdomen: soft, nontender,  Uterine Fundus: firm, appropriately tender DVT Evaluation: No calf swelling or tenderness Extremities: without edema Skin: warm, dry; incision clean/dry/intact  Recent Labs    07/18/21 1132 07/19/21 0543  HGB 11.7* 10.4*  HCT 36.3 30.9*    Assessment/Plan: Erin Benjamin is a 30 y.o. G3P3003 s/p rLTCS at [redacted]w[redacted]d. EBL 550cc PPD#1 - Doing well Contraception: Nexplanon  Feeding: both Dispo: Plan for discharge on POD 3.   LOS: 1 day   37, CNM 07/19/2021, 7:20 AM    I supervised the student during this patient encounter. Please see my progress note for complete documentation. Leonard Schwartz, CNM  07/25/2021 1:50 PM
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
## 2021-07-19 NOTE — Progress Notes (Signed)
Post Partum Day 1 Subjective: no complaints, tolerating PO, and + flatus; foley just taken out, due to void  Objective: Blood pressure 107/70, pulse 61, temperature 97.8 F (36.6 C), resp. rate 16, height 5\' 8"  (1.727 m), weight 104.1 kg, last menstrual period 10/10/2020, SpO2 97 %, unknown if currently breastfeeding.  Physical Exam:  General: alert, cooperative, and no distress Heart: RRR Lungs: CTAB Abd: soft, NT, non-distended, +BS Lochia: appropriate Uterine Fundus: firm Incision: no significant drainage, no dehiscence, no significant erythema DVT Evaluation: No evidence of DVT seen on physical exam. Negative Homan's sign. No cords or calf tenderness. No significant calf/ankle edema.  Recent Labs    07/18/21 1132 07/19/21 0543  HGB 11.7* 10.4*  HCT 36.3 30.9*    Assessment/Plan: Plan for discharge tomorrow, Breastfeeding, Lactation consult, Social Work consult, and Contraception Nexplanon POD #1   LOS: 1 day   14/05/22, CNM 07/19/2021, 7:37 AM

## 2021-07-20 LAB — SURGICAL PATHOLOGY

## 2021-07-20 NOTE — Social Work (Addendum)
CSW received consult due to score 13 on Edinburgh Depression Screen.      CSW met with MOB on 12/5. CSW provided education regarding Baby Blues vs PMADs and provided MOB with resources for mental health follow up.  CSW encouraged MOB to evaluate her mental health throughout the postpartum period with the use of the New Mom Checklist developed by Postpartum Progress as well as the Edinburgh Postnatal Depression Scale and notify a medical professional if symptoms arise.      Derrico Zhong, MSW, LCSW [facility]  Clinical Social Worker  [phone] 07/20/2021  8:32 AM  

## 2021-07-20 NOTE — Anesthesia Postprocedure Evaluation (Signed)
Anesthesia Post Note  Patient: Erin Benjamin  Procedure(s) Performed: CESAREAN SECTION     Patient location during evaluation: PACU Anesthesia Type: Spinal Level of consciousness: oriented and awake and alert Pain management: pain level controlled Vital Signs Assessment: post-procedure vital signs reviewed and stable Respiratory status: spontaneous breathing, respiratory function stable and patient connected to nasal cannula oxygen Cardiovascular status: blood pressure returned to baseline and stable Postop Assessment: no headache, no backache and no apparent nausea or vomiting Anesthetic complications: no   No notable events documented.  Last Vitals:  Vitals:   07/19/21 2316 07/20/21 0500  BP: (!) 96/55 111/66  Pulse: (!) 54 (!) 57  Resp:  18  Temp:  36.6 C  SpO2:      Last Pain:  Vitals:   07/20/21 0619  TempSrc:   PainSc: 7                  [name] S

## 2021-07-20 NOTE — Progress Notes (Signed)
Post Partum Day 2 Subjective: Doing well. No acute events overnight. Pain is controlled and bleeding is appropriate. She is eating, drinking, voiding, and ambulating without issue. She is breast and bottle feeding which is going well. Reports that she has not produced much milk yet but has met with the lactation specialists for extra support. She has no other concerns at this time.  Objective: Blood pressure 111/66, pulse (!) 57, temperature 97.9 F (36.6 C), temperature source Oral, resp. rate 18, height $RemoveBe'5\' 8"'[id number]$  (1.727 m), weight 104.1 kg, last menstrual period 10/10/2020, SpO2 99 %, unknown if currently breastfeeding.  Physical Exam:  General: alert, cooperative, and no distress Lochia: appropriate Uterine Fundus: firm Incision: healing well, no significant drainage, no dehiscence, no significant erythema Extremities: No edema or calf tenderness.    Recent Labs    07/18/21 1132 07/19/21 0543  HGB 11.7* 10.4*  HCT 36.3 30.9*    Assessment/Plan: Erin Benjamin is a 30 y.o. G3P3003 on PPD# 2 s/p rLTCS with vertical T extension.  Progressing well. Meeting postpartum milestones. VSS. Continue routine postpartum care.  Feeding: breast and bottle Contraception: Nexplanon at post partum visit  Dispo: Anticipate discharge tomorrow   LOS: 2 days   Kristeen Miss, MD  07/20/2021, 7:48 AM

## 2021-07-20 NOTE — Lactation Note (Signed)
This note was copied from a baby's chart. Lactation Consultation Note  Patient Name: Erin Benjamin [id number] Date: 07/20/2021   Age:29 hours  Mom declined LC services as per RN, Donalynn Furlong.   Maternal Data    Feeding Nipple Type: Slow - flow  LATCH Score                    Lactation Tools Discussed/Used    Interventions    Discharge    Consult Status      [name]  Nicholson-Springer 07/20/2021, 9:14 PM

## 2021-07-20 NOTE — BH Specialist Note (Deleted)
Integrated Behavioral Health via Telemedicine Visit  07/20/2021 Haddie Bruhl [id number]  Number of Integrated Behavioral Health visits: 1 Session Start time: 9:15***  Session End time: 10:15*** Total time: {IBH Total Time:21014050}  Referring Provider: *** Patient/Family location: Home*** [facility] Provider location: [facility]  All persons participating in visit: Patient *** and [facility] Rexine Gowens ***  Types of Service: {CHL AMB TYPE OF SERVICE:[phone]}  I connected with [name] and/or Rahma Shough's {family members:20773} via  Telephone or Video Enabled Telemedicine Application  (Video is Caregility application) and verified that I am speaking with the correct person using two identifiers. Discussed confidentiality: {YES/NO:21197}  I discussed the limitations of telemedicine and the availability of in person appointments.  Discussed there is a possibility of technology failure and discussed alternative modes of communication if that failure occurs.  I discussed that engaging in this telemedicine visit, they consent to the provision of behavioral healthcare and the services will be billed under their insurance.  Patient and/or legal guardian expressed understanding and consented to Telemedicine visit: {YES/NO:21197}  Presenting Concerns: Patient and/or family reports the following symptoms/concerns: *** Duration of problem: ***; Severity of problem: {Mild/Moderate/Severe:20260}  Patient and/or Family's Strengths/Protective Factors: {CHL AMB BH PROTECTIVE FACTORS:[phone]}  Goals Addressed: Patient will:  Reduce symptoms of: {IBH Symptoms:21014056}   Increase knowledge and/or ability of: {IBH Patient Tools:21014057}   Demonstrate ability to: {IBH Goals:21014053}  Progress towards Goals: {CHL AMB BH PROGRESS TOWARDS GOALS:[phone]}  Interventions: Interventions utilized:  {IBH  Interventions:21014054} Standardized Assessments completed: {IBH Screening Tools:21014051}  Patient and/or Family Response: ***  Assessment: Patient currently experiencing ***.   Patient may benefit from ***.  Plan: Follow up with behavioral health clinician on : *** Behavioral recommendations: *** Referral(s): {IBH Referrals:21014055}  I discussed the assessment and treatment plan with the patient and/or parent/guardian. They were provided an opportunity to ask questions and all were answered. They agreed with the plan and demonstrated an understanding of the instructions.   They were advised to call back or seek an in-person evaluation if the symptoms worsen or if the condition fails to improve as anticipated.  Valetta Close Glorianna Gott, LCSW

## 2021-07-21 MED ORDER — POLYETHYLENE GLYCOL 3350 17 G PO PACK
17.0000 g | PACK | Freq: Every day | ORAL | 0 refills | Status: DC | PRN
Start: 2021-07-21 — End: 2021-08-20

## 2021-07-21 MED ORDER — ACETAMINOPHEN 500 MG PO TABS: 1000.0000 mg | ORAL_TABLET | Freq: Four times a day (QID) | ORAL | 0 refills | Status: DC

## 2021-07-21 MED ORDER — POLYETHYLENE GLYCOL 3350 17 G PO PACK: 17.0000 g | PACK | Freq: Every day | ORAL | Status: DC | PRN

## 2021-07-21 MED ORDER — IBUPROFEN 600 MG PO TABS: 600.0000 mg | ORAL_TABLET | Freq: Four times a day (QID) | ORAL | 0 refills | Status: DC

## 2021-07-21 MED ORDER — OXYCODONE HCL 5 MG PO TABS
5.0000 mg | ORAL_TABLET | ORAL | 0 refills | Status: DC | PRN
Start: 2021-07-21 — End: 2021-08-20

## 2021-07-26 ENCOUNTER — Ambulatory Visit: Payer: Medicaid Other

## 2021-07-26 ENCOUNTER — Telehealth: Payer: Self-pay

## 2021-07-26 NOTE — Telephone Encounter (Signed)
Patient missed appt today for incision check following c-section on 07/18/21. VM left requesting patient reschedule. MyChart message sent.

## 2021-07-27 ENCOUNTER — Encounter (HOSPITAL_COMMUNITY): Payer: Self-pay | Admitting: Emergency Medicine

## 2021-07-27 ENCOUNTER — Inpatient Hospital Stay (HOSPITAL_COMMUNITY)
Admission: AD | Admit: 2021-07-27 | Discharge: 2021-07-27 | Disposition: A | Discharge status: Home / Self Care | Payer: Medicaid Other | Attending: Family Medicine | Admitting: Family Medicine

## 2021-07-27 ENCOUNTER — Other Ambulatory Visit: Payer: Self-pay

## 2021-07-27 DIAGNOSIS — O900 Disruption of cesarean delivery wound: Secondary | ICD-10-CM | POA: Diagnosis not present

## 2021-07-27 DIAGNOSIS — Z98891 History of uterine scar from previous surgery: Secondary | ICD-10-CM

## 2021-07-27 MED ORDER — SILVER NITRATE-POT NITRATE 75-25 % EX MISC
CUTANEOUS | Status: AC
  Filled 2021-07-27: qty 10

## 2021-07-27 NOTE — MAU Provider Note (Signed)
History     CSN: [id number]  Arrival date and time: 07/27/21 0808     Chief Complaint  Patient presents with   Post-op Problem   Incision   HPI This is a 30 year old G3 P3-0-0-3 currently 9 days postpartum from a cesarean delivery.  She missed her wound check appointment yesterday, but is complaining of increasing pain along her incision.  She looked at her abdominal incision this morning because of increasing pain and thought that the incision had opened up.  No palliating or provoking factors.  No drainage from the area.  OB History     Gravida  3   Para  3   Term  3   Preterm  0   AB  0   Living  3      SAB      IAB      Ectopic      Multiple  0   Live Births  3           Past Medical History:  Diagnosis Date   Bacterial vaginosis 06/15/2017   Found on NOB pap; not treated.  Pt has no s/s on 11-1. Consider repeat wet prep at [facility].    Cholelithiasis with acute cholecystitis 07/30/2018   Diverticulitis of colon with perforation 11/10/2019   Medical history non-contributory    Post partum depression    Post-operative state 11/18/2017   Previous cesarean delivery affecting pregnancy, antepartum 05/29/2017   Signed TOLAC consent    Past Surgical History:  Procedure Laterality Date   CESAREAN SECTION  08/2014   CESAREAN SECTION N/A 11/18/2017   Procedure: CESAREAN SECTION;  Surgeon: Hermina Staggers, MD;  Location: [facility] BIRTHING SU[facility];  Service: Obstetrics;  Laterality: N/A;   CESAREAN SECTION N/A 07/18/2021   Procedure: CESAREAN SECTION;  Surgeon: Milas Hock, MD;  Location: [facility] LD ORS;  Service: Obstetrics;  Laterality: N/A;   CHOLECYSTECTOMY N/A 07/30/2018   Procedure: LAPAROSCOPIC CHOLECYSTECTOMY WITH INTRAOPERATIVE CHOLANGIOGRAM;  Surgeon: Harriette Bouillon, MD;  Location: [facility] OR;  Service: General;  Laterality: N/A;    Family History  Problem Relation Age of Onset   Diabetes Mother     Social History   Tobacco Use   Smoking status: Former     Packs/day: 1.00    Types: Cigarettes    Quit date: 02/20/2017    Years since quitting: 4.4   Smokeless tobacco: Never   Tobacco comments:    stopped with pregnancy  Vaping Use   Vaping Use: Former  Substance Use Topics   Alcohol use: Not Currently    Comment:     Drug use: Not Currently    Types: Marijuana    Comment: stopped 02/2017    Allergies: No Known Allergies  Medications Prior to Admission  Medication Sig Dispense Refill Last Dose   acetaminophen (TYLENOL) 500 MG tablet Take 2 tablets (1,000 mg total) by mouth every 6 (six) hours. 30 tablet 0    ibuprofen (ADVIL) 600 MG tablet Take 1 tablet (600 mg total) by mouth every 6 (six) hours. 30 tablet 0    oxyCODONE (OXY IR/ROXICODONE) 5 MG immediate release tablet Take 1-2 tablets (5-10 mg total) by mouth every 4 (four) hours as needed for moderate pain. 30 tablet 0    polyethylene glycol (MIRALAX / GLYCOLAX) 17 g packet Take 17 g by mouth daily as needed for mild constipation. 14 each 0    Prenatal Vit-Fe Fumarate-FA (WESTAB PLUS) 27-1 MG TABS Take 1 tablet by mouth daily.  30 tablet 3     Review of Systems Physical Exam   Blood pressure 133/88, pulse 98, temperature 98.3 F (36.8 C), temperature source Oral, resp. rate 20, height 5\' 8"  (1.727 m), weight 95.4 kg, SpO2 97 %, unknown if currently breastfeeding.  Physical Exam Constitutional:      Appearance: Normal appearance.  Cardiovascular:     Rate and Rhythm: Normal rate and regular rhythm.  Abdominal:     Comments: Along the midportion of the incision, there is a 3 cm x 0.8 cm area of exposed subcutaneous tissue.  There is no adipose tissue exposed.  There is no erythema to the incision or to the exposed subcutaneous area.  There is no purulent drainage, but mild tenderness to palpation.  Neurological:     Mental Status: She is alert.    MAU Course  Procedures  MDM No dehiscence  Assessment and Plan   1. S/P repeat low transverse C-section   2. Separation of  cesarean wound with drainage, postpartum    No evidence of infection or frank dehiscence of the incision.  Silver nitrate applied to the area.  Patient directed to keep the area clean and dry.  ABD pad applied, which could be exchanged out with a peripad, etc.  Nurse visit appointment made at primary OBs office in 3 days.  Patient instructed to return with increasing pain, erythema, purulent drainage.  07/27/2021, 9:55 AM

## 2021-07-27 NOTE — ED Triage Notes (Addendum)
Pt had a c-section on 12/4 and is concerned that her surgical site is opening.  Reports 9/10 pain.  States she didn't have a lot of pain with previous c-section.  Intermittent chills @ night over the last week.  Denies fever.  Small amount of wound dehiscence.

## 2021-07-27 NOTE — Discharge Instructions (Addendum)
Keep the incision dry Return to MAU with increasing pain, redness, draining pus Keep your appointment at [facility] on Friday at 11am.

## 2021-07-27 NOTE — ED Provider Notes (Signed)
Emergency Medicine Provider Triage Evaluation Note  Erin Benjamin , a 30 y.o. female  was evaluated in triage.  Pt complains of post C-section wound concern.  Patient reports having her third C-section on December 4, woke up this morning with pain in her incision line, unable to visualize very well but is concerned that it is not healing well.  Review of Systems  Positive: Postop complication Negative: Fever  Physical Exam  BP 133/85 (BP Location: Left Arm)   Pulse 74   Temp 98.3 F (36.8 C) (Oral)   Resp 16   LMP 10/10/2020 (Exact Date)   SpO2 97%  Gen:   Awake, no distress   Resp:  Normal effort  MSK:   Moves extremities without difficulty  Other:  Small area of wound dehiscence  Medical Decision Making  Medically screening exam initiated at 8:33 AM.  Appropriate orders placed.  Erin Benjamin was informed that the remainder of the evaluation will be completed by another provider, this initial triage assessment does not replace that evaluation, and the importance of remaining in the ED until their evaluation is complete.  Case discussed with MAU APP Danielle who accepts patient in transfer to MAU for further evaluation and treatment.   Jeannie Fend, PA-C 07/27/21 [id number]    Gerhard Munch, MD 07/28/21 1321

## 2021-07-27 NOTE — ED Notes (Signed)
PA evaluated pt at triage and report called to MAU.

## 2021-07-27 NOTE — MAU Note (Addendum)
Presents with c/o post op pain and incision check.  Reports hasn't picked up prescription for pain meds.  Reports incision is open.  Failed to go to incision check appt.

## 2021-07-27 NOTE — Progress Notes (Signed)
Dr. Adrian Blackwater into see pt & evaluate incision.

## 2021-07-30 ENCOUNTER — Ambulatory Visit (INDEPENDENT_AMBULATORY_CARE_PROVIDER_SITE_OTHER): Payer: Medicaid Other

## 2021-07-30 ENCOUNTER — Other Ambulatory Visit: Payer: Self-pay

## 2021-07-30 VITALS — BP 118/79 | HR 75 | Wt 204.0 lb

## 2021-07-30 DIAGNOSIS — Z5189 Encounter for other specified aftercare: Secondary | ICD-10-CM

## 2021-07-30 DIAGNOSIS — R4586 Emotional lability: Secondary | ICD-10-CM

## 2021-07-30 NOTE — Progress Notes (Signed)
Pt here today for incision check after going to MAU on 07/27/21 with incision concern.  Pt reports that overall she is feeling okay.  [facility] amb ref placed.  Incision well approximated- no odor, no drainage, no edema, no erythema.  Small areas measuring about 3 mm that still need to heal.  Notified Dr. Jolayne Panther who placed silver nitrate on small areas.  Pt advised to allow warm soapy water to run over incision and pat dry.  Pt encouraged to continue to monitor incision and to call the office with concerns.  Pt verbalized understanding.    Addison Naegeli, RN  08/03/21

## 2021-08-02 ENCOUNTER — Telehealth (HOSPITAL_COMMUNITY): Payer: Self-pay | Admitting: *Deleted

## 2021-08-02 NOTE — Telephone Encounter (Signed)
Phone disconnected while talking with mom.  Duffy Rhody, RN 08-02-2021 at 2:53pm

## 2021-08-04 NOTE — Progress Notes (Signed)
Patient seen and assessed by nursing staff.  Agree with documentation and plan.  

## 2021-08-10 NOTE — BH Specialist Note (Signed)
Pt did not arrive to video visit and did not answer the phone; Left HIPPA-compliant message to call back Arian Murley from [facility] at  [phone] (Aaliyan Brinkmeier's office).  ?; left MyChart message for patient.  ? ?

## 2021-08-17 ENCOUNTER — Ambulatory Visit: Payer: Medicaid Other | Admitting: Student

## 2021-08-20 ENCOUNTER — Other Ambulatory Visit: Payer: Self-pay

## 2021-08-20 ENCOUNTER — Ambulatory Visit (INDEPENDENT_AMBULATORY_CARE_PROVIDER_SITE_OTHER): Payer: Medicaid Other | Admitting: General Practice

## 2021-08-20 VITALS — BP 121/83 | HR 76 | Ht 68.0 in | Wt 208.0 lb

## 2021-08-20 DIAGNOSIS — T8149XA Infection following a procedure, other surgical site, initial encounter: Secondary | ICD-10-CM

## 2021-08-20 MED ORDER — DOUBLE ANTIBIOTIC 500-10000 UNIT/GM EX OINT: 1.0000 "application " | TOPICAL_OINTMENT | Freq: Two times a day (BID) | CUTANEOUS | 0 refills | Status: AC

## 2021-08-20 MED ORDER — SULFAMETHOXAZOLE-TRIMETHOPRIM 800-160 MG PO TABS: 1.0000 | ORAL_TABLET | Freq: Two times a day (BID) | ORAL | 0 refills | Status: AC

## 2021-08-20 MED ORDER — IBUPROFEN 800 MG PO TABS: 800.0000 mg | ORAL_TABLET | Freq: Three times a day (TID) | ORAL | 1 refills | Status: DC | PRN

## 2021-08-20 NOTE — Progress Notes (Signed)
Patient presents to office today with concern of wound infection. She reports increased drainage with pus coming out & a bad smell in the past 2 weeks. She also reports a recent increase in pain. Patient had repeat c-section on 12/4. Area around incision has multiple areas of excoriations with drainage present. Dr Vergie Living in to assess incision. Wound care was reviewed with patient.   Chase Caller RN BSN 08/20/21

## 2021-08-23 ENCOUNTER — Ambulatory Visit: Payer: Medicaid Other | Admitting: Clinical

## 2021-08-23 DIAGNOSIS — Z91199 Patient's noncompliance with other medical treatment and regimen due to unspecified reason: Secondary | ICD-10-CM

## 2021-08-24 NOTE — Progress Notes (Signed)
Patient was assessed and managed by nursing staff during this encounter. I have reviewed the chart and agree with the documentation and plan. I have also made any necessary editorial changes. Will do bactrim and bacitracin for cellulitis; pt to pump and dump. There are multiple areas where the incision overhands the bottom aspect and will likely need silver nitrate to the areas at her next visit but no skin or fascial dehiscence.   Bath Bing, MD 08/24/2021 3:32 PM

## 2021-08-30 NOTE — Progress Notes (Deleted)
° ° °  [place] Partum Visit Note  Erin Benjamin is a 31 y.o. G8P3003 female who presents for a postpartum visit. She is 6 weeks postpartum following a repeat cesarean section.  I have fully reviewed the prenatal and intrapartum course. The delivery was at 39.3 gestational weeks.  Anesthesia: spinal. Postpartum course has been ***. Baby is doing well***. Baby is feeding by both breast and bottle - {formula:72}. Bleeding {vag bleed:12292}. Bowel function is {normal:32111}. Bladder function is {normal:32111}. Patient {is/is not:9024} sexually active. Contraception method is Nexplanon. Postpartum depression screening: {gen negative/positive:315881}.   The pregnancy intention screening data noted above was reviewed. Potential methods of contraception were discussed. The patient elected to proceed with No data recorded.    Health Maintenance Due  Topic Date Due   COVID-19 Vaccine (1) Never done   Pneumococcal Vaccine 59-37 Years old (1 - PCV) Never done   INFLUENZA VACCINE  Never done    {Common ambulatory SmartLinks:19316}  Review of Systems {ros; complete:30496}  Objective:  There were no vitals taken for this visit.   General:  {gen appearance:16600}   Breasts:  {desc; normal/abnormal/not indicated:14647}  Lungs: {lung exam:16931}  Heart:  {heart exam:5510}  Abdomen: {abdomen exam:16834}   Wound {Wound assessment:11097}  GU exam:  {desc; normal/abnormal/not indicated:14647}       Assessment:    There are no diagnoses linked to this encounter.  *** postpartum exam.   Plan:   Essential components of care per ACOG recommendations:  1.  Mood and well being: Patient with {gen negative/positive:315881} depression screening today. Reviewed local resources for support.  - Patient tobacco use? {tobacco use:25506}  - hx of drug use? {yes/no:25505}    2. Infant care and feeding:  -Patient currently breastmilk feeding? {yes/no:25502}  -Social determinants of health (SDOH) reviewed in  EPIC. No concerns***The following needs were identified***  3. Sexuality, contraception and birth spacing - Patient {DOES_DOES NF:[phone] want a pregnancy in the next year.  Desired family size is {NUMBER 1-10:22536} children.  - Reviewed forms of contraception in tiered fashion. Patient desired {PLAN CONTRACEPTION:313102} today.   - Discussed birth spacing of 18 months  4. Sleep and fatigue -Encouraged family/partner/community support of 4 hrs of uninterrupted sleep to help with mood and fatigue  5. Physical Recovery  - Discussed patients delivery and complications. She describes her labor as {description:25511} - Patient had a {CHL AMB DELIVERY:[phone]}. Patient had a {laceration:25518} laceration. Perineal healing reviewed. Patient expressed understanding - Patient has urinary incontinence? {yes/no:25515} - Patient {ACTION; IS/IS VG:[id number] safe to resume physical and sexual activity  6.  Health Maintenance - HM due items addressed {Yes or If no, why not?:20788} - Last pap smear  Diagnosis  Date Value Ref Range Status  02/10/2021   Final   - Negative for intraepithelial lesion or malignancy (NILM)   Pap smear {done:10129} at today's visit.  -Breast Cancer screening indicated? {indicated:25516}  7. Chronic Disease/Pregnancy Condition follow up: {Follow up:25499}  - PCP follow up  Georgia Lopes, [place] [facility] [place]

## 2021-08-31 ENCOUNTER — Ambulatory Visit: Payer: Medicaid Other | Admitting: Family Medicine

## 2021-09-08 ENCOUNTER — Telehealth: Payer: Self-pay | Admitting: Family Medicine

## 2021-09-08 NOTE — Telephone Encounter (Signed)
Returned call to patient. Explained she will need provider visit to discuss birth control options. Appt is scheduled for 09/24/21. Reviewed with patient. Pt states she prefers virtual appt and plans to keep this appt to discuss birth control.

## 2021-09-08 NOTE — Telephone Encounter (Signed)
Patient is requesting a RX for Birth control pills

## 2021-09-15 NOTE — BH Specialist Note (Addendum)
Integrated Behavioral Health via Telemedicine Visit  09/28/2021 Synia Benjamin [id number]  Number of Integrated Behavioral Health Clinician visits: 1- Initial Visit  Session Start time: 1425   Session End time: 1504  Total time in minutes: 39   Referring Provider: Federico Flake, MD Patient/Family location: Home [facility] Provider location: [facility]  All persons participating in visit: Patient Erin Benjamin and [facility] Kolton Benjamin   Types of Service: Individual psychotherapy and Video visit  I connected with Erin Benjamin and/or Erin Benjamin's  n/a  via  Telephone or Video Enabled Telemedicine Application  (Video is Caregility application) and verified that I am speaking with the correct person using two identifiers. Discussed confidentiality: Yes   I discussed the limitations of telemedicine and the availability of in person appointments.  Discussed there is a possibility of technology failure and discussed alternative modes of communication if that failure occurs.  I discussed that engaging in this telemedicine visit, they consent to the provision of behavioral healthcare and the services will be billed under their insurance.  Patient and/or legal guardian expressed understanding and consented to Telemedicine visit: Yes   Presenting Concerns: Patient and/or family reports the following symptoms/concerns: Increase in depression postpartum, while staying at [facility]; will begin taking Zoloft tonight; will work with case manager on obtaining car and housing and work; back to school this summer. Duration of problem: Ongoing with pp increase; Severity of problem: moderate  Patient and/or Family's Strengths/Protective Factors: Social connections, Concrete supports in place (healthy food, safe environments, etc.), Sense of purpose, and Physical Health (exercise, healthy diet, medication compliance, etc.)  Goals  Addressed: Patient will:  Reduce symptoms of: anxiety, depression, and stress   Increase knowledge and/or ability of: healthy habits and stress reduction   Demonstrate ability to: Increase healthy adjustment to current life circumstances  Progress towards Goals: Ongoing  Interventions: Interventions utilized:  Solution-Focused Strategies, Psychoeducation and/or Health Education, and Link to [employer] Standardized Assessments completed: GAD-7 and PHQ 9  Patient and/or Family Response: Pt agrees with treatment plan  Assessment: Patient currently experiencing Major depressive disorder, recurrent, moderate and Psychosocial stress.   Patient may benefit from psychoeducation and brief therapeutic interventions regarding coping with symptoms of depression, anxiety, life stress .  Plan: Follow up with behavioral health clinician on : Two weeks Behavioral recommendations:  -Begin taking Zoloft as prescribed -Begin using sleep sounds at night -Continue working with case Production designer, theatre/television/film at [employer] to work on goals towards obtaining car, work, and permanent housing -Continue plan to find PCP (some options listed on AVS) Referral(s): childcare and new mom support  I discussed the assessment and treatment plan with the patient and/or parent/guardian. They were provided an opportunity to ask questions and all were answered. They agreed with the plan and demonstrated an understanding of the instructions.   They were advised to call back or seek an in-person evaluation if the symptoms worsen or if the condition fails to improve as anticipated.  Rae Lips, LCSW  Depression screen [facility] 2/9 09/27/2021 06/22/2021 02/10/2021 11/20/2020 05/06/2019  Decreased Interest 0 1 0 1 0  Down, Depressed, Hopeless 3 1 0 1 1  PHQ - 2 Score 3 2 0 2 1  Altered sleeping 3 1 2 1 1   Tired, decreased energy 3 1 2 1 1   Change in appetite 3 1 0 1 1  Feeling bad or failure about yourself  0 1 0 0 1  Trouble  concentrating 0 1  0 0 1  Moving slowly or fidgety/restless 0 0 0 0 0  Suicidal thoughts 0 0 0 0 0  PHQ-9 Score 12 7 4 5 6   Difficult doing work/chores - Not difficult at all - - -  Some recent data might be hidden   GAD 7 : Generalized Anxiety Score 09/27/2021 06/22/2021 02/10/2021 11/20/2020  Nervous, Anxious, on Edge 1 1 0 3  Control/stop worrying 1 1 0 0  Worry too much - different things 1 1 2  0  Trouble relaxing 3 1 0 0  Restless 1 0 0 0  Easily annoyed or irritable 1 1 0 0  Afraid - awful might happen 3 1 0 0  Total GAD 7 Score 11 6 2 3   Anxiety Difficulty - Not difficult at all - -

## 2021-09-24 ENCOUNTER — Telehealth (INDEPENDENT_AMBULATORY_CARE_PROVIDER_SITE_OTHER): Payer: Medicaid Other

## 2021-09-24 DIAGNOSIS — F53 Postpartum depression: Secondary | ICD-10-CM

## 2021-09-24 DIAGNOSIS — Z30011 Encounter for initial prescription of contraceptive pills: Secondary | ICD-10-CM

## 2021-09-24 MED ORDER — NORETHIN ACE-ETH ESTRAD-FE 1-20 MG-MCG(24) PO TABS: 1.0000 | ORAL_TABLET | Freq: Every day | ORAL | 11 refills | Status: AC

## 2021-09-24 MED ORDER — SERTRALINE HCL 50 MG PO TABS: 50.0000 mg | ORAL_TABLET | Freq: Every day | ORAL | 1 refills | Status: AC

## 2021-09-24 NOTE — Patient Instructions (Signed)

## 2021-09-24 NOTE — Progress Notes (Signed)
Provider location: Center for [facility] at [employer] [facility]   Patient location: Home  I connected with [name] on 09/24/21 at 10:55 AM EST by Mychart Video Encounter and verified that I am speaking with the correct person using two identifiers.       I discussed the limitations, risks, security and privacy concerns of performing an evaluation and management service virtually and the availability of in person appointments. I also discussed with the patient that there may be a patient responsible charge related to this service. The patient expressed understanding and agreed to proceed.  Post Partum Visit Note Subjective:   Erin Benjamin is a 31 y.o. [id number] female who presents for a postpartum visit. She is 9 weeks postpartum following a repeat cesarean section.  I have fully reviewed the prenatal and intrapartum course. The delivery was at 39/3 gestational weeks.  Anesthesia: spinal. Postpartum course has been uncomplicated. Baby is doing well. Baby is feeding by bottle - [employer] Good Start. Bleeding staining only. Bowel function is  constipated. BM once/ week . Bladder function is normal. Patient is sexually active. Contraception method is condoms. Postpartum depression screening: positive.   The pregnancy intention screening data noted above was reviewed. Potential methods of contraception were discussed. The patient elected to proceed with No data recorded.   The following portions of the patient's history were reviewed and updated as appropriate: allergies, current medications, past family history, past medical history, past social history, past surgical history, and problem list.  Review of Systems Pertinent items are noted in HPI.  Objective:  Breastfeeding No     General:  Alert, oriented and cooperative. Patient is in no acute distress.  Respiratory: Normal respiratory effort, no problems with respiration noted  Mental Status: Normal mood and affect. Normal  behavior. Normal judgment and thought content.  Rest of physical exam deferred due to type of encounter   Assessment:   Normal postpartum exam Postpartum depression Initiation of oral contraception   Plan:  Essential components of care per ACOG recommendations:  1.  Mood and well being: Patient with positive depression screening today.  Feeling sad, difficulty sleeping, constant thoughts running through head. Exacerbated by current living situation-lives in shelter Reviewed local resources for support.  Has appointment with Asher Muir on 2/13 Requesting Zoloft. Rx sent today. - Patient does use tobacco. 1-2 cigarettes/day. Started back recently due to depression. We discussed reduction and for recently cessation risk of relapse - hx of drug use? No    2. Infant care and feeding:  -Patient currently breastmilk feeding? No  -Social determinants of health (SDOH) reviewed in EPIC. Living in shelter  3. Sexuality, contraception and birth spacing - Patient does not want a pregnancy in the next year.  Desired family size is 3 children.  - Reviewed forms of contraception in tiered fashion. Patient desired oral contraceptives (estrogen/progesterone) today.   - Had intercourse once with condom 2 weeks ago-none since.  - Started period 09/23/21. May start OCP's asap. Recommend condom use.  - Discussed birth spacing of 18 months - Recommend smoking cessation  4. Sleep and fatigue -Encouraged family/partner/community support of 4 hrs of uninterrupted sleep to help with mood and fatigue  5. Physical Recovery  - Discussed patients delivery - Patient has urinary incontinence? No - Patient is safe to resume physical and sexual activity  6.  Health Maintenance - Last pap smear done 02/10/21 and was normal with negative HPV.   7. Chronic Disease - None  I provided  16 minutes of face-to-face time during this encounter.   No follow-ups on file.  Future Appointments  Date Time Provider  Department Center  09/27/2021  2:15 PM [facility] [place] [facility]    Brand Males, CNM Center for [employer], [facility]

## 2021-09-27 ENCOUNTER — Ambulatory Visit: Payer: Medicaid Other | Admitting: Clinical

## 2021-09-27 DIAGNOSIS — F331 Major depressive disorder, recurrent, moderate: Secondary | ICD-10-CM

## 2021-09-27 DIAGNOSIS — Z658 Other specified problems related to psychosocial circumstances: Secondary | ICD-10-CM

## 2021-09-27 NOTE — Patient Instructions (Addendum)
Center for [facility] at [facility] [address] [place], [place] [address] [phone] (main office) [phone] (Thao Bauza's office)  [place] Child Counsellor  (Childcare options, Early childcare development, etc.) [email or web address]  New Mom Support [email or web address]   [place] primary care offices possibly accepting new patients:   Primary Care at [facility] [address] [place], [place] [address] [phone]  [facility] at [facility] [address] [place], [place] [address] [phone]  [facility] and [facility] [address] [place], [place] [address] [phone]  [facility] [address] [place], [place] [address] [phone]  [facility] [address] [place],  [place]  [address] [phone]

## 2021-10-11 ENCOUNTER — Ambulatory Visit: Payer: Medicaid Other | Admitting: Clinical

## 2021-10-11 DIAGNOSIS — Z91199 Patient's noncompliance with other medical treatment and regimen due to unspecified reason: Secondary | ICD-10-CM

## 2021-10-11 NOTE — BH Specialist Note (Signed)
Pt did not arrive to video visit and did not answer the phone; Left HIPPA-compliant message to call back Arthur Aydelotte from [facility] at  [phone] (Kelcy Baeten's office).  ?; left MyChart message for patient.  ? ?

## 2021-12-02 ENCOUNTER — Encounter (HOSPITAL_COMMUNITY): Payer: Self-pay | Admitting: Emergency Medicine

## 2021-12-02 ENCOUNTER — Ambulatory Visit (HOSPITAL_COMMUNITY)
Admission: EM | Admit: 2021-12-02 | Discharge: 2021-12-02 | Disposition: A | Discharge status: Home / Self Care | Payer: Medicaid Other | Attending: Internal Medicine | Admitting: Internal Medicine

## 2021-12-02 ENCOUNTER — Other Ambulatory Visit: Payer: Self-pay

## 2021-12-02 DIAGNOSIS — H1033 Unspecified acute conjunctivitis, bilateral: Secondary | ICD-10-CM | POA: Diagnosis not present

## 2021-12-02 MED ORDER — GENTAMICIN SULFATE 0.3 % OP SOLN: 2.0000 [drp] | OPHTHALMIC | 0 refills | Status: DC

## 2021-12-02 NOTE — ED Triage Notes (Signed)
Pt reports eye irritation since yesterday.  ?

## 2021-12-02 NOTE — Discharge Instructions (Signed)
Place antibacterial eyedrops into your eyes 2 drops every 4 hours for 7 days. ?Please follow-up with your primary care provider for further evaluation and management of your bacterial conjunctivitis.  I have initiated PCP assistance for you to be able to find a primary care provider in the area.  You will be receiving a phone call from [facility] health to help set this appointment up.  It is extremely important that everyone has a primary care provider for ongoing management of medical problems and wellness visits. ? ?I hope you feel better!  Get some sleep! ?

## 2021-12-02 NOTE — ED Provider Notes (Signed)
[facility]    CSN: [id number] Arrival date & time: 12/02/21  1642      History   Chief Complaint Chief Complaint  Patient presents with   Conjunctivitis    HPI Erin Benjamin is a 31 y.o. female.   Patient presents to urgent care for evaluation of her right eye drainage and redness for the last 24 hours.  Both of her daughters have bacterial pinkeye and she is requesting eyedrops to be treated.  She states that she had body aches yesterday but they went away on their own without medication.  Denies fever, nausea, vomiting, diarrhea, heart palpitations, and shortness of breath.  Denies exposure to sick contacts other than her daughters.  She has been trying to wash her hands as much as she can and use warm compresses with no relief.  States the drainage from her eyes makes her eyes swollen shut in the morning when she wakes up and is crusty.  Denies any other aggravating or relieving factors.   Conjunctivitis   Past Medical History:  Diagnosis Date   Bacterial vaginosis 06/15/2017   Found on NOB pap; not treated.  Pt has no s/s on 11-1. Consider repeat wet prep at [facility].    Cholelithiasis with acute cholecystitis 07/30/2018   Diverticulitis of colon with perforation 11/10/2019   Medical history non-contributory    Post partum depression    Post-operative state 11/18/2017   Previous cesarean delivery affecting pregnancy, antepartum 05/29/2017   Signed TOLAC consent    Patient Active Problem List   Diagnosis Date Noted   S/P repeat low transverse C-section 07/18/2021   History of cesarean delivery 06/22/2021   Limited prenatal care 06/22/2021   Supervision of high risk pregnancy, antepartum 02/10/2021   Former cigarette smoker 05/18/2017    Past Surgical History:  Procedure Laterality Date   CESAREAN SECTION  08/2014   CESAREAN SECTION N/A 11/18/2017   Procedure: CESAREAN SECTION;  Surgeon: Hermina Staggers, MD;  Location: [facility] BIRTHING SUITES;  Service:  Obstetrics;  Laterality: N/A;   CESAREAN SECTION N/A 07/18/2021   Procedure: CESAREAN SECTION;  Surgeon: Milas Hock, MD;  Location: [facility] LD ORS;  Service: Obstetrics;  Laterality: N/A;   CHOLECYSTECTOMY N/A 07/30/2018   Procedure: LAPAROSCOPIC CHOLECYSTECTOMY WITH INTRAOPERATIVE CHOLANGIOGRAM;  Surgeon: Harriette Bouillon, MD;  Location: [facility] OR;  Service: General;  Laterality: N/A;    OB History     Gravida  3   Para  3   Term  3   Preterm  0   AB  0   Living  3      SAB      IAB      Ectopic      Multiple  0   Live Births  3            Home Medications    Prior to Admission medications   Medication Sig Start Date End Date Taking? Authorizing Provider  gentamicin (GARAMYCIN) 0.3 % ophthalmic solution Place 2 drops into both eyes every 4 (four) hours. 12/02/21  Yes Carlisle Beers, FNP  ibuprofen (ADVIL) 800 MG tablet Take 1 tablet (800 mg total) by mouth every 8 (eight) hours as needed. Patient not taking: Reported on 09/24/2021 08/20/21   [place] Bing, MD  Norethindrone Acetate-Ethinyl Estrad-FE (LOESTRIN 24 FE) 1-20 MG-MCG(24) tablet Take 1 tablet by mouth daily. 09/24/21   Brand Males, CNM  sertraline (ZOLOFT) 50 MG tablet Take 1 tablet (50 mg total) by mouth daily. 09/24/21  Brand Males, CNM    Family History Family History  Problem Relation Age of Onset   Diabetes Mother     Social History Social History   Tobacco Use   Smoking status: Former    Packs/day: 1.00    Types: Cigarettes    Quit date: 02/20/2017    Years since quitting: 4.7   Smokeless tobacco: Never   Tobacco comments:    stopped with pregnancy  Vaping Use   Vaping Use: Former  Substance Use Topics   Alcohol use: Not Currently    Comment:     Drug use: Not Currently    Types: Marijuana    Comment: stopped 02/2017     Allergies   Patient has no known allergies.   Review of Systems Review of Systems   Physical Exam Triage Vital Signs ED Triage Vitals  [12/02/21 1719]  Enc Vitals Group     BP 125/89     Pulse Rate 78     Resp 20     Temp 98.8 F (37.1 C)     Temp Source Oral     SpO2 100 %     Weight      Height      Head Circumference      Peak Flow      Pain Score 0     Pain Loc      Pain Edu?      Excl. in GC?    No data found.  Updated Vital Signs BP 125/89 (BP Location: Right Arm)   Pulse 78   Temp 98.8 F (37.1 C) (Oral)   Resp 20   SpO2 100%   Visual Acuity Right Eye Distance:   Left Eye Distance:   Bilateral Distance:    Right Eye Near:   Left Eye Near:    Bilateral Near:     Physical Exam Vitals and nursing note reviewed.  Constitutional:      General: She is not in acute distress.    Appearance: Normal appearance. She is well-developed. She is not ill-appearing.  HENT:     Head: Normocephalic and atraumatic.     Right Ear: Tympanic membrane, ear canal and external ear normal.     Left Ear: Tympanic membrane, ear canal and external ear normal.     Nose: Nose normal.     Mouth/Throat:     Mouth: Mucous membranes are moist.  Eyes:     General: Lids are normal. Vision grossly intact. Gaze aligned appropriately.        Right eye: Discharge present. No foreign body.        Left eye: Discharge present.No foreign body.     Extraocular Movements: Extraocular movements intact.     Conjunctiva/sclera:     Right eye: Right conjunctiva is injected.     Left eye: Left conjunctiva is not injected.     Pupils: Pupils are equal, round, and reactive to light.  Cardiovascular:     Rate and Rhythm: Normal rate and regular rhythm.     Heart sounds: Normal heart sounds. No murmur heard.   No friction rub. No gallop.  Pulmonary:     Effort: Pulmonary effort is normal. No respiratory distress.     Breath sounds: Normal breath sounds. No wheezing, rhonchi or rales.  Chest:     Chest wall: No tenderness.  Abdominal:     Palpations: Abdomen is soft.     Tenderness: There is no abdominal tenderness. There is no  right CVA tenderness or left CVA tenderness.  Musculoskeletal:        General: No swelling.     Cervical back: Neck supple.  Skin:    General: Skin is warm and dry.     Capillary Refill: Capillary refill takes less than 2 seconds.     Findings: No rash.  Neurological:     General: No focal deficit present.     Mental Status: She is alert and oriented to person, place, and time.  Psychiatric:        Mood and Affect: Mood normal.        Behavior: Behavior normal.        Thought Content: Thought content normal.        Judgment: Judgment normal.     UC Treatments / Results  Labs (all labs ordered are listed, but only abnormal results are displayed) Labs Reviewed - No data to display  EKG   Radiology No results found.  Procedures Procedures (including critical care time)  Medications Ordered in UC Medications - No data to display  Initial Impression / Assessment and Plan / UC Course  I have reviewed the triage vital signs and the nursing notes.  Pertinent labs & imaging results that were available during my care of the patient were reviewed by me and considered in my medical decision making (see chart for details).  Clinical diagnosis of pinkeye made today.  Plan to treat with gentamicin eyedrops to both eyes 2 drops every 4 hours for 7 days.  Patient instructed to keep using antibiotic eyedrops even if her symptoms get better.  She verbalizes understanding and agrees with this plan.  Also instructed to continue washing hands thoroughly and continue using warm compresses for symptom relief.  Patient denies allergies to antibiotics.  Strict ED return precautions given.  Patient does not have a primary care provider so PCP assistance was initiated today.  Patient thankful for this and states that she will answer call to schedule appointment with PCP for further evaluation and management of her symptoms.  Patient verbalizes understanding and agrees with plan.  All questions  answered.  Given work note to return on Saturday after 24 hours of antibiotic eyedrops.   Final Clinical Impressions(s) / UC Diagnoses   Final diagnoses:  Acute bacterial conjunctivitis of both eyes     Discharge Instructions      Place antibacterial eyedrops into your eyes 2 drops every 4 hours for 7 days. Please follow-up with your primary care provider for further evaluation and management of your bacterial conjunctivitis.  I have initiated PCP assistance for you to be able to find a primary care provider in the area.  You will be receiving a phone call from [facility] health to help set this appointment up.  It is extremely important that everyone has a primary care provider for ongoing management of medical problems and wellness visits.  I hope you feel better!  Get some sleep!   ED Prescriptions     Medication Sig Dispense Auth. Provider   gentamicin (GARAMYCIN) 0.3 % ophthalmic solution Place 2 drops into both eyes every 4 (four) hours. 5 mL Carlisle Beers, FNP      PDMP not reviewed this encounter.   Carlisle Beers, [place] 12/02/21 1816

## 2021-12-08 ENCOUNTER — Encounter (HOSPITAL_COMMUNITY): Payer: Self-pay

## 2021-12-16 ENCOUNTER — Ambulatory Visit: Payer: Medicaid Other | Admitting: Obstetrics and Gynecology

## 2022-03-16 ENCOUNTER — Emergency Department (HOSPITAL_COMMUNITY)
Admission: EM | Admit: 2022-03-16 | Discharge: 2022-03-16 | Disposition: A | Discharge status: Home / Self Care | Payer: Medicaid Other | Attending: Emergency Medicine | Admitting: Emergency Medicine

## 2022-03-16 ENCOUNTER — Emergency Department (HOSPITAL_COMMUNITY): Payer: Medicaid Other

## 2022-03-16 ENCOUNTER — Other Ambulatory Visit: Payer: Self-pay

## 2022-03-16 DIAGNOSIS — Y9389 Activity, other specified: Secondary | ICD-10-CM | POA: Insufficient documentation

## 2022-03-16 DIAGNOSIS — M7021 Olecranon bursitis, right elbow: Secondary | ICD-10-CM | POA: Diagnosis not present

## 2022-03-16 DIAGNOSIS — M25521 Pain in right elbow: Secondary | ICD-10-CM | POA: Diagnosis present

## 2022-03-16 MED ORDER — DOXYCYCLINE HYCLATE 100 MG PO TABS
100.0000 mg | ORAL_TABLET | Freq: Once | ORAL | Status: AC
  Administered 2022-03-16: 100 mg via ORAL
  Filled 2022-03-16: qty 1

## 2022-03-16 MED ORDER — DOXYCYCLINE HYCLATE 100 MG PO CAPS: 100.0000 mg | ORAL_CAPSULE | Freq: Two times a day (BID) | ORAL | 0 refills | Status: DC

## 2022-03-16 MED ORDER — IBUPROFEN 800 MG PO TABS: 800.0000 mg | ORAL_TABLET | Freq: Three times a day (TID) | ORAL | 0 refills | Status: AC

## 2022-03-16 MED ORDER — IBUPROFEN 800 MG PO TABS
800.0000 mg | ORAL_TABLET | Freq: Once | ORAL | Status: AC
  Administered 2022-03-16: 800 mg via ORAL
  Filled 2022-03-16: qty 1

## 2022-03-16 NOTE — ED Triage Notes (Signed)
Pt here for R elbow pain that started at 0900. Pt states it hurts to straighten and bend arm, notes swelling to posterior elbow, area is swollen and warm to touch, no redness noted. Pt reports having chills this afternoon.

## 2022-03-16 NOTE — ED Notes (Signed)
PATIENT WENT TO HER CAR HAS NOT CAME BACK

## 2022-03-16 NOTE — ED Provider Notes (Signed)
[facility] Emergency Department Provider Note MRN:  [id number]  Arrival date & time: 03/16/22     Chief Complaint   Elbow Pain   History of Present Illness   Erin Benjamin is a 31 y.o. year-old female presents to the ED with chief complaint of right elbow pain that started this morning at 9 AM.  She denies any injury or trauma.  She states that she has had some swelling to the backside of her elbow which is becoming more more painful as the day progresses.  She states she might of had some chills, but denies measured fever.  She denies any treatments prior to arrival.  History provided by patient.   Review of Systems  Pertinent review of systems noted in HPI.    Physical Exam   Vitals:   03/16/22 0052  BP: 113/87  Pulse: 80  Resp: 18  Temp: 98.9 F (37.2 C)  SpO2: 98%    CONSTITUTIONAL:  well-appearing, NAD NEURO:  Alert and oriented x 3, CN 3-12 grossly intact EYES:  eyes equal and reactive ENT/NECK:  Supple, no stridor  CARDIO:  appears well-perfused  PULM:  No respiratory distress,  GI/GU:  non-distended,  MSK/SPINE: Mild swelling to the posterior right elbow over the olecranon bursa, no erythema, there is some warmth, range of motion of the right elbow is 5/5 in extension, flexion, pronation and supination SKIN:  no rash, atraumatic   *Additional and/or pertinent findings included in MDM below  Diagnostic and Interventional Summary    EKG Interpretation  Date/Time:    Ventricular Rate:    PR Interval:    QRS Duration:   QT Interval:    QTC Calculation:   R Axis:     Text Interpretation:         Labs Reviewed - No data to display  DG Elbow Complete Right  Final Result      Medications  ibuprofen (ADVIL) tablet 800 mg (has no administration in time range)  doxycycline (VIBRA-TABS) tablet 100 mg (has no administration in time range)     Procedures  /  Critical Care Procedures  ED Course and Medical Decision Making  I  have reviewed the triage vital signs, the nursing notes, and pertinent available records from the EMR.  Social Determinants Affecting Complexity of Care: Patient has no clinically significant social determinants affecting this chief complaint..   ED Course:    Medical Decision Making Patient here with right elbow pain.  Onset this morning.  She does have some warmth.  She denies any history of gout.  She does have tenderness over the olecranon bursa, which could be consistent with olecranon bursitis.  Not thought to be septic bursitis.  She is afebrile.  I doubt septic arthritis given that she has normal range of motion of the elbow and there is no effusion seen on plain film.  Problems Addressed: Olecranon bursitis of right elbow: acute illness or injury  Amount and/or Complexity of Data Reviewed Radiology: ordered and independent interpretation performed.    Details: No fracture seen  Risk Prescription drug management.     Consultants: No consultations were needed in caring for this patient.   Treatment and Plan: Emergency department workup does not suggest an emergent condition requiring admission or immediate intervention beyond  what has been performed at this time. The patient is safe for discharge and has  been instructed to return immediately for worsening symptoms, change in  symptoms or any other concerns  Final Clinical Impressions(s) / ED Diagnoses     ICD-10-CM   1. Olecranon bursitis of right elbow  M70.21       ED Discharge Orders          Ordered    ibuprofen (ADVIL) 800 MG tablet  3 times daily        03/16/22 0303    doxycycline (VIBRAMYCIN) 100 MG capsule  2 times daily        03/16/22 0303              Discharge Instructions Discussed with and Provided to Patient:   Discharge Instructions   None      Roxy Horseman, PA-C 03/16/22 9357    Alvira Monday, MD 03/16/22 2325
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
## 2022-07-11 ENCOUNTER — Encounter (HOSPITAL_COMMUNITY): Payer: Self-pay

## 2022-07-11 ENCOUNTER — Other Ambulatory Visit: Payer: Self-pay

## 2022-07-11 ENCOUNTER — Emergency Department (HOSPITAL_COMMUNITY)
Admission: EM | Admit: 2022-07-11 | Discharge: 2022-07-11 | Disposition: A | Discharge status: Home / Self Care | Payer: Medicaid Other | Attending: Emergency Medicine | Admitting: Emergency Medicine

## 2022-07-11 ENCOUNTER — Emergency Department (HOSPITAL_COMMUNITY): Payer: Medicaid Other

## 2022-07-11 DIAGNOSIS — Z20822 Contact with and (suspected) exposure to covid-19: Secondary | ICD-10-CM | POA: Diagnosis not present

## 2022-07-11 DIAGNOSIS — J101 Influenza due to other identified influenza virus with other respiratory manifestations: Secondary | ICD-10-CM | POA: Insufficient documentation

## 2022-07-11 DIAGNOSIS — R509 Fever, unspecified: Secondary | ICD-10-CM | POA: Diagnosis present

## 2022-07-11 DIAGNOSIS — Z720 Tobacco use: Secondary | ICD-10-CM | POA: Insufficient documentation

## 2022-07-11 LAB — URINALYSIS, MICROSCOPIC (REFLEX): Bacteria, UA: NONE SEEN

## 2022-07-11 LAB — CBC
HCT: 42.3 % (ref 36.0–46.0)
Hemoglobin: 14.2 g/dL (ref 12.0–15.0)
MCH: 31.3 pg (ref 26.0–34.0)
MCHC: 33.6 g/dL (ref 30.0–36.0)
MCV: 93.2 fL (ref 80.0–100.0)
Platelets: 217 10*3/uL (ref 150–400)
RBC: 4.54 MIL/uL (ref 3.87–5.11)
RDW: 12.3 % (ref 11.5–15.5)
WBC: 7.8 10*3/uL (ref 4.0–10.5)
nRBC: 0 % (ref 0.0–0.2)

## 2022-07-11 LAB — BASIC METABOLIC PANEL
Anion gap: 11 (ref 5–15)
BUN: 7 mg/dL (ref 6–20)
CO2: 19 mmol/L — ABNORMAL LOW (ref 22–32)
Calcium: 8.4 mg/dL — ABNORMAL LOW (ref 8.9–10.3)
Chloride: 106 mmol/L (ref 98–111)
Creatinine, Ser: 0.8 mg/dL (ref 0.44–1.00)
GFR, Estimated: >60 mL/min (ref >60)
Glucose, Bld: 97 mg/dL (ref 70–99)
Potassium: 3.8 mmol/L (ref 3.5–5.1)
Sodium: 136 mmol/L (ref 135–145)

## 2022-07-11 LAB — URINALYSIS, ROUTINE W REFLEX MICROSCOPIC
Bilirubin Urine: NEGATIVE
Glucose, UA: NEGATIVE mg/dL
Ketones, ur: NEGATIVE mg/dL
Leukocytes,Ua: NEGATIVE
Nitrite: NEGATIVE
Protein, ur: NEGATIVE mg/dL
Specific Gravity, Urine: 1.02 (ref 1.005–1.030)
pH: 6 (ref 5.0–8.0)

## 2022-07-11 LAB — RESP PANEL BY RT-PCR (FLU A&B, COVID) ARPGX2
Influenza A by PCR: POSITIVE — AB
Influenza B by PCR: NEGATIVE
SARS Coronavirus 2 by RT PCR: NEGATIVE

## 2022-07-11 MED ORDER — ACETAMINOPHEN 325 MG PO TABS
650.0000 mg | ORAL_TABLET | Freq: Once | ORAL | Status: AC | PRN
  Administered 2022-07-11: 650 mg via ORAL
  Filled 2022-07-11: qty 2

## 2022-07-11 MED ORDER — LACTATED RINGERS IV BOLUS
2000.0000 mL | Freq: Once | INTRAVENOUS | Status: AC
  Administered 2022-07-11: 2000 mL via INTRAVENOUS

## 2022-07-11 MED ORDER — OSELTAMIVIR PHOSPHATE 75 MG PO CAPS: 75.0000 mg | ORAL_CAPSULE | Freq: Two times a day (BID) | ORAL | 0 refills | Status: DC

## 2022-07-11 MED ORDER — IBUPROFEN 400 MG PO TABS
600.0000 mg | ORAL_TABLET | Freq: Once | ORAL | Status: AC
  Administered 2022-07-11: 600 mg via ORAL
  Filled 2022-07-11: qty 1

## 2022-07-11 NOTE — ED Provider Triage Note (Signed)
Emergency Medicine Provider Triage Evaluation Note  Erin Benjamin , a 31 y.o. female  was evaluated in triage.  Pt complains of generalized body aches.  Started Friday night.  Also with associated headache, productive cough with green sputum, congestion and subjective fever.  States that her daughter had diarrhea 2 to 3 days ago otherwise no one else has been sick at home.  Denies nuchal rigidity.  Taking Tylenol ibuprofen at home for her symptoms.  States she is having difficulty getting out of bed because how weak she feels with the body aches.  No chest pain or abdominal pain.  Review of Systems  Positive: See above Negative: See above  Physical Exam  BP 123/88 (BP Location: Right Arm)   Pulse (!) 105   Temp (!) 102.9 F (39.4 C)   Resp 16   Ht 5\' 8"  (1.727 m)   Wt 93 kg   SpO2 98%   BMI 31.17 kg/m  Gen:   Awake, no distress  Resp:  Normal effort  MSK:   Moves extremities without difficulty  Other:    Medical Decision Making  Medically screening exam initiated at 9:55 AM.  Appropriate orders placed.  was informed that the remainder of the evaluation will be completed by another provider, this initial triage assessment does not replace that evaluation, and the importance of remaining in the ED until their evaluation is complete.  See above   [name], PA-C 07/11/22 [phone]

## 2022-07-11 NOTE — ED Provider Notes (Signed)
[facility] EMERGENCY DEPARTMENT Provider Note   CSN: [id number] Arrival date & time: 07/11/22  [id number]     History  Chief Complaint  Patient presents with   Generalized Body Aches   Fever    Erin Benjamin is a 31 y.o. female.   Fever Associated symptoms: chills, congestion, cough, headaches and myalgias   Patient presents for 2 days of flulike symptoms.  The symptoms have included cough, congestion, body aches, fever, chills, decreased appetite.  She has been taking ibuprofen at home.  Last dose at home was yesterday.  Patient received Tylenol ibuprofen while in ED waiting room.  She did have improvement of symptoms.  Currently, she endorses continued fatigue and congestion.  Medical history includes prior tobacco use.     Home Medications Prior to Admission medications   Medication Sig Start Date End Date Taking? Authorizing Provider  oseltamivir (TAMIFLU) 75 MG capsule Take 1 capsule (75 mg total) by mouth every 12 (twelve) hours. 07/11/22  Yes Godfrey Pick, MD  doxycycline (VIBRAMYCIN) 100 MG capsule Take 1 capsule (100 mg total) by mouth 2 (two) times daily. 03/16/22   Montine Circle, PA-C  ibuprofen (ADVIL) 800 MG tablet Take 1 tablet (800 mg total) by mouth 3 (three) times daily. 03/16/22   Montine Circle, PA-C  Norethindrone Acetate-Ethinyl Estrad-FE (LOESTRIN 24 FE) 1-20 MG-MCG(24) tablet Take 1 tablet by mouth daily. 09/24/21   Renee Harder, CNM  sertraline (ZOLOFT) 50 MG tablet Take 1 tablet (50 mg total) by mouth daily. 09/24/21   Renee Harder, CNM      Allergies    Patient has no known allergies.    Review of Systems   Review of Systems  Constitutional:  Positive for activity change, appetite change, chills, fatigue and fever.  HENT:  Positive for congestion.   Respiratory:  Positive for cough.   Musculoskeletal:  Positive for myalgias.  Neurological:  Positive for headaches.  All other systems reviewed and are  negative.   Physical Exam Updated Vital Signs BP 122/82   Pulse 89   Temp 99 F (37.2 C) (Oral)   Resp 18   Ht 5\' 8"  (1.727 m)   Wt 93 kg   SpO2 100%   BMI 31.17 kg/m  Physical Exam Vitals and nursing note reviewed.  Constitutional:      General: She is not in acute distress.    Appearance: Normal appearance. She is well-developed. She is not ill-appearing, toxic-appearing or diaphoretic.  HENT:     Head: Normocephalic and atraumatic.     Right Ear: External ear normal.     Left Ear: External ear normal.     Nose: Congestion present.  Eyes:     Extraocular Movements: Extraocular movements intact.     Conjunctiva/sclera: Conjunctivae normal.  Cardiovascular:     Rate and Rhythm: Normal rate and regular rhythm.  Pulmonary:     Effort: Pulmonary effort is normal. No respiratory distress.  Abdominal:     General: There is no distension.     Palpations: Abdomen is soft.     Tenderness: There is no abdominal tenderness.  Musculoskeletal:        General: No swelling. Normal range of motion.     Cervical back: Normal range of motion and neck supple.     Right lower leg: No edema.     Left lower leg: No edema.  Skin:    General: Skin is warm and dry.     Coloration: Skin is  not jaundiced or pale.  Neurological:     General: No focal deficit present.     Mental Status: She is alert.     Cranial Nerves: No cranial nerve deficit.     Sensory: No sensory deficit.     Motor: No weakness.     Coordination: Coordination normal.  Psychiatric:        Mood and Affect: Mood normal.        Thought Content: Thought content normal.        Judgment: Judgment normal.     ED Results / Procedures / Treatments   Labs (all labs ordered are listed, but only abnormal results are displayed) Labs Reviewed  RESP PANEL BY RT-PCR (FLU A&B, COVID) ARPGX2 - Abnormal; Notable for the following components:      Result Value   Influenza A by PCR POSITIVE (*)    All other components within  normal limits  BASIC METABOLIC PANEL - Abnormal; Notable for the following components:   CO2 19 (*)    Calcium 8.4 (*)    All other components within normal limits  URINALYSIS, ROUTINE W REFLEX MICROSCOPIC - Abnormal; Notable for the following components:   Hgb urine dipstick SMALL (*)    All other components within normal limits  CBC  URINALYSIS, MICROSCOPIC (REFLEX)  I-STAT BETA HCG BLOOD, ED ([facility], [facility], [facility] ONLY)    EKG None  Radiology DG Chest 2 View  Result Date: 07/11/2022 CLINICAL DATA:  Generalized body aches with cough and fever for 3 days. EXAM: CHEST - 2 VIEW COMPARISON:  None Available. FINDINGS: Suspected calcified right perihilar lymph nodes, best seen on the lateral view. The heart size and mediastinal contours are otherwise normal. The lungs appear clear. There is no pleural effusion or pneumothorax. No acute osseous findings are evident. IMPRESSION: No evidence of pneumonia or other acute cardiopulmonary process. Suspected calcified right perihilar lymph nodes. Electronically Signed   By: Richardean Sale M.D.   On: 07/11/2022 10:41    Procedures Procedures    Medications Ordered in ED Medications  ibuprofen (ADVIL) tablet 600 mg (600 mg Oral Given 07/11/22 1010)  acetaminophen (TYLENOL) tablet 650 mg (650 mg Oral Given 07/11/22 1232)  lactated ringers bolus 2,000 mL (2,000 mLs Intravenous New Bag/Given 07/11/22 1511)    ED Course/ Medical Decision Making/ A&P                           Medical Decision Making Risk OTC drugs. Prescription drug management.   Patient presents for flulike symptoms for the past 2 days.  On arrival in the ED, patient found to be febrile and tachycardic.  She was given ibuprofen and Tylenol with improvement in symptoms.  Prior to being bedded in the ED, diagnostic work-up was initiated.  Results of work-up are notable for influenza A positivity.  Upon being bedded in the ED, patient has normal heart rate.  SPO2 is normal on room air.   Breathing is unlabored.  Patient is well-appearing.  Chest x-ray shows no focal opacities.  Patient was ordered IV fluids in the ED.  She was advised to continue supportive care at home.  I discussed risks and benefits of Tamiflu.  Patient would like to initiate Tamiflu therapy.  This was prescribed.  Following IV fluids, patient felt much improved.  Vital signs remained normal.  She was discharged in good condition.        Final Clinical Impression(s) / ED Diagnoses  Final diagnoses:  Influenza A    Rx / DC Orders ED Discharge Orders          Ordered    oseltamivir (TAMIFLU) 75 MG capsule  Every 12 hours        07/11/22 1454              Gloris Manchester, MD 07/11/22 1629

## 2022-07-11 NOTE — ED Notes (Signed)
AVS with prescriptions provided to and discussed with patient. Pt verbalizes understanding of discharge instructions and denies any questions or concerns at this time. Pt ambulated out of department independently with steady gait. ? ?

## 2022-07-11 NOTE — ED Notes (Signed)
Dr. Dixon at bedside assessing pt.

## 2022-07-11 NOTE — ED Triage Notes (Signed)
Pt arrived POV from home c/o generalized body aches, chills and a fever since Saturday. Pt states she has been taking ibuprofen for the symptoms with her last dose taken yesterday evening.

## 2022-07-11 NOTE — Discharge Instructions (Addendum)
Take ibuprofen and Tylenol for relief of symptoms.  Continue to stay hydrated.  A prescription for Tamiflu was sent to your pharmacy.  This is a medication that can shorten the duration and decrease the severity of your symptoms.  Side effects can include GI upset and diarrhea.  If you experience these symptoms with this medication, discontinue use.  Return to emergency department for any new or worsening symptoms of concern.

## 2022-08-03 ENCOUNTER — Ambulatory Visit (HOSPITAL_COMMUNITY)
Admission: EM | Admit: 2022-08-03 | Discharge: 2022-08-03 | Disposition: A | Discharge status: Home / Self Care | Payer: Medicaid Other | Attending: Physician Assistant | Admitting: Physician Assistant

## 2022-08-03 ENCOUNTER — Encounter (HOSPITAL_COMMUNITY): Payer: Self-pay

## 2022-08-03 ENCOUNTER — Ambulatory Visit (INDEPENDENT_AMBULATORY_CARE_PROVIDER_SITE_OTHER): Payer: Medicaid Other

## 2022-08-03 DIAGNOSIS — J209 Acute bronchitis, unspecified: Secondary | ICD-10-CM | POA: Diagnosis not present

## 2022-08-03 DIAGNOSIS — R059 Cough, unspecified: Secondary | ICD-10-CM

## 2022-08-03 MED ORDER — PREDNISONE 20 MG PO TABS: 40.0000 mg | ORAL_TABLET | Freq: Every day | ORAL | 0 refills | Status: AC

## 2022-08-03 MED ORDER — ALBUTEROL SULFATE HFA 108 (90 BASE) MCG/ACT IN AERS: 1.0000 | INHALATION_SPRAY | Freq: Four times a day (QID) | RESPIRATORY_TRACT | 0 refills | Status: AC | PRN

## 2022-08-03 MED ORDER — BENZONATATE 100 MG PO CAPS: 100.0000 mg | ORAL_CAPSULE | Freq: Three times a day (TID) | ORAL | 0 refills | Status: AC

## 2022-08-03 NOTE — Discharge Instructions (Signed)
Your x-ray was normal with no evidence of pneumonia.  I do believe that you have bronchitis.  Start prednisone burst of 40 mg for 4 days.  Do not take NSAIDs with this medication including aspirin, ibuprofen/Advil, naproxen/Aleve.  Use Mucinex, Tylenol, Flonase for additional symptom relief.  Use Tessalon up to 3 times a day for cough.  Use albuterol inhaler every 4-6 hours as needed for shortness of breath and coughing fits.  Make sure you rest and drink plenty of fluid.  If your symptoms do not improving or if anything worsens you need to be reevaluated.

## 2022-08-03 NOTE — ED Triage Notes (Signed)
Pt stated she tested positive flu last month but continues with the cough  x month

## 2022-08-03 NOTE — ED Provider Notes (Signed)
[facility]    CSN: [id number] Arrival date & time: 08/03/22  1942      History   Chief Complaint Chief Complaint  Patient presents with   Cough    HPI Erin Benjamin is a 31 y.o. female.   Patient presents today with a 1 month history of cough that has worsened significantly in the past week.  Reports that she was diagnosed with the flu approximately month ago and completed course of medication.  She had improvement of all symptoms except for the cough.  She does have a history of smoking but denies formal diagnosis of asthma or COPD.  Denies any recent antibiotics or steroids.  She is confident that she is not pregnant she just got off her menstrual cycle.  She is not breast-feeding.  She has tried multiple over-the-counter medications including Tylenol and Robitussin without improvement of symptoms.  She has not had COVID-19 vaccine.  She has not had COVID in the past.  She took a COVID test when symptoms worsened earlier this week and was negative.    Past Medical History:  Diagnosis Date   Bacterial vaginosis 06/15/2017   Found on NOB pap; not treated.  Pt has no s/s on 11-1. Consider repeat wet prep at [facility].    Cholelithiasis with acute cholecystitis 07/30/2018   Diverticulitis of colon with perforation 11/10/2019   Medical history non-contributory    Post partum depression    Post-operative state 11/18/2017   Previous cesarean delivery affecting pregnancy, antepartum 05/29/2017   Signed TOLAC consent    Patient Active Problem List   Diagnosis Date Noted   S/P repeat low transverse C-section 07/18/2021   History of cesarean delivery 06/22/2021   Limited prenatal care 06/22/2021   Supervision of high risk pregnancy, antepartum 02/10/2021   Former cigarette smoker 05/18/2017    Past Surgical History:  Procedure Laterality Date   CESAREAN SECTION  08/2014   CESAREAN SECTION N/A 11/18/2017   Procedure: CESAREAN SECTION;  Surgeon: Hermina Staggers, MD;   Location: [facility] BIRTHING SU[facility];  Service: Obstetrics;  Laterality: N/A;   CESAREAN SECTION N/A 07/18/2021   Procedure: CESAREAN SECTION;  Surgeon: Milas Hock, MD;  Location: [facility] LD ORS;  Service: Obstetrics;  Laterality: N/A;   CHOLECYSTECTOMY N/A 07/30/2018   Procedure: LAPAROSCOPIC CHOLECYSTECTOMY WITH INTRAOPERATIVE CHOLANGIOGRAM;  Surgeon: Harriette Bouillon, MD;  Location: [facility] OR;  Service: General;  Laterality: N/A;    OB History     Gravida  3   Para  3   Term  3   Preterm  0   AB  0   Living  3      SAB      IAB      Ectopic      Multiple  0   Live Births  3            Home Medications    Prior to Admission medications   Medication Sig Start Date End Date Taking? Authorizing Provider  albuterol (VENTOLIN HFA) 108 (90 Base) MCG/ACT inhaler Inhale 1-2 puffs into the lungs every 6 (six) hours as needed for wheezing or shortness of breath. 08/03/22  Yes Swetha Rayle K, PA-C  benzonatate (TESSALON) 100 MG capsule Take 1 capsule (100 mg total) by mouth every 8 (eight) hours. 08/03/22  Yes Jabron Weese K, PA-C  predniSONE (DELTASONE) 20 MG tablet Take 2 tablets (40 mg total) by mouth daily for 4 days. 08/03/22 08/07/22 Yes Latunya Kissick K, PA-C  ibuprofen (ADVIL)  800 MG tablet Take 1 tablet (800 mg total) by mouth 3 (three) times daily. 03/16/22   Roxy Horseman, PA-C  Norethindrone Acetate-Ethinyl Estrad-FE (LOESTRIN 24 FE) 1-20 MG-MCG(24) tablet Take 1 tablet by mouth daily. 09/24/21   Brand Males, CNM  sertraline (ZOLOFT) 50 MG tablet Take 1 tablet (50 mg total) by mouth daily. 09/24/21   Brand Males, CNM    Family History Family History  Problem Relation Age of Onset   Diabetes Mother     Social History Social History   Tobacco Use   Smoking status: Former    Packs/day: 1.00    Types: Cigarettes    Quit date: 02/20/2017    Years since quitting: 5.4   Smokeless tobacco: Never   Tobacco comments:    stopped with pregnancy  Vaping Use    Vaping Use: Former  Substance Use Topics   Alcohol use: Not Currently    Comment:     Drug use: Not Currently    Types: Marijuana    Comment: stopped 02/2017     Allergies   Patient has no known allergies.   Review of Systems Review of Systems  Constitutional:  Positive for activity change. Negative for appetite change, fatigue and fever.  HENT:  Negative for congestion, sinus pressure, sneezing and sore throat.   Respiratory:  Positive for cough, shortness of breath and wheezing. Negative for chest tightness.   Cardiovascular:  Negative for chest pain.  Gastrointestinal:  Negative for abdominal pain, diarrhea, nausea and vomiting.  Neurological:  Positive for headaches. Negative for dizziness and light-headedness.     Physical Exam Triage Vital Signs ED Triage Vitals  Enc Vitals Group     BP 08/03/22 2057 138/86     Pulse Rate 08/03/22 2057 68     Resp 08/03/22 2057 12     Temp 08/03/22 2057 98.3 F (36.8 C)     Temp Source 08/03/22 2057 Oral     SpO2 08/03/22 2057 100 %     Weight --      Height --      Head Circumference --      Peak Flow --      Pain Score 08/03/22 2058 0     Pain Loc --      Pain Edu? --      Excl. in GC? --    No data found.  Updated Vital Signs BP 138/86 (BP Location: Right Arm)   Pulse 68   Temp 98.3 F (36.8 C) (Oral)   Resp 12   LMP 07/13/2022   SpO2 100%   Visual Acuity Right Eye Distance:   Left Eye Distance:   Bilateral Distance:    Right Eye Near:   Left Eye Near:    Bilateral Near:     Physical Exam Vitals reviewed.  Constitutional:      General: She is awake. She is not in acute distress.    Appearance: Normal appearance. She is well-developed. She is not ill-appearing.     Comments: Very pleasant female appears stated age in no acute distress sitting comfortably in exam room  HENT:     Head: Normocephalic and atraumatic.     Right Ear: Tympanic membrane, ear canal and external ear normal. Tympanic membrane is  not erythematous or bulging.     Left Ear: Tympanic membrane, ear canal and external ear normal. Tympanic membrane is not erythematous or bulging.     Nose:     Right Sinus: No  maxillary sinus tenderness or frontal sinus tenderness.     Left Sinus: No maxillary sinus tenderness or frontal sinus tenderness.     Mouth/Throat:     Pharynx: Uvula midline. Posterior oropharyngeal erythema present. No oropharyngeal exudate.  Cardiovascular:     Rate and Rhythm: Normal rate and regular rhythm.     Heart sounds: Normal heart sounds, S1 normal and S2 normal. No murmur heard. Pulmonary:     Effort: Pulmonary effort is normal.     Breath sounds: Examination of the right-lower field reveals rales. Rales present. No wheezing or rhonchi.  Psychiatric:        Behavior: Behavior is cooperative.      UC Treatments / Results  Labs (all labs ordered are listed, but only abnormal results are displayed) Labs Reviewed - No data to display  EKG   Radiology DG Chest 2 View  Result Date: 08/03/2022 CLINICAL DATA:  Cough x1 month EXAM: CHEST - 2 VIEW COMPARISON:  07/11/2022 FINDINGS: The heart size and mediastinal contours are within normal limits. Both lungs are clear. The visualized skeletal structures are unremarkable. IMPRESSION: No active cardiopulmonary disease. Electronically Signed   By: Ernie AvenaPalani  Rathinasamy M.D.   On: 08/03/2022 21:24    Procedures Procedures (including critical care time)  Medications Ordered in UC Medications - No data to display  Initial Impression / Assessment and Plan / UC Course  I have reviewed the triage vital signs and the nursing notes.  Pertinent labs & imaging results that were available during my care of the patient were reviewed by me and considered in my medical decision making (see chart for details).     Patient is well-appearing, afebrile, nontoxic, nontachycardic, with oxygen saturation of 100%.  Chest x-ray was obtained given 1 month of coughing and  adventitious lung sounds which showed no acute cardiopulmonary disease.  She was started on prednisone burst of 40 mg for 4 days.  Discussed that this should not be taken with NSAIDs due to risk of GI bleeding.  She was also given albuterol inhaler with instruction to use every 4-6 hours as needed for shortness of breath and coughing fits.  She can use Tessalon for additional cough.  Recommended that she use Mucinex, Flonase, Tylenol for additional symptom relief.  She is to rest and drink plenty of fluid.  If her symptoms are improving quickly with medication she is to return for reevaluation.  Discussed that if she has any worsening symptoms including worsening cough, shortness of breath, fever, nausea/vomiting, weakness she needs to be seen immediately.  Strict return precautions given.  Work excuse note provided.  Final Clinical Impressions(s) / UC Diagnoses   Final diagnoses:  Acute bronchitis, unspecified organism     Discharge Instructions      Your x-ray was normal with no evidence of pneumonia.  I do believe that you have bronchitis.  Start prednisone burst of 40 mg for 4 days.  Do not take NSAIDs with this medication including aspirin, ibuprofen/Advil, naproxen/Aleve.  Use Mucinex, Tylenol, Flonase for additional symptom relief.  Use Tessalon up to 3 times a day for cough.  Use albuterol inhaler every 4-6 hours as needed for shortness of breath and coughing fits.  Make sure you rest and drink plenty of fluid.  If your symptoms do not improving or if anything worsens you need to be reevaluated.     ED Prescriptions     Medication Sig Dispense Auth. Provider   albuterol (VENTOLIN HFA) 108 (90 Base) MCG/ACT  inhaler Inhale 1-2 puffs into the lungs every 6 (six) hours as needed for wheezing or shortness of breath. 18 g Richard Holz K, PA-C   predniSONE (DELTASONE) 20 MG tablet Take 2 tablets (40 mg total) by mouth daily for 4 days. 8 tablet Galia Rahm K, PA-C   benzonatate (TESSALON) 100  MG capsule Take 1 capsule (100 mg total) by mouth every 8 (eight) hours. 21 capsule Alaija Ruble K, PA-C      PDMP not reviewed this encounter.   Jeani Hawking, PA-C 08/03/22 2136

## 2022-10-05 ENCOUNTER — Ambulatory Visit: Payer: Self-pay

## 2022-10-05 ENCOUNTER — Other Ambulatory Visit: Payer: Self-pay | Admitting: Family Medicine

## 2022-10-05 DIAGNOSIS — Z0000 Encounter for general adult medical examination without abnormal findings: Secondary | ICD-10-CM
